# Patient Record
Sex: Male | Born: 1950 | Race: White | Hispanic: No | Marital: Single | Smoking: Never smoker
Health system: Southern US, Community
[De-identification: ages and names within clinical notes are randomized; demographics above are authoritative.]

## PROBLEM LIST (undated history)

## (undated) DIAGNOSIS — N200 Calculus of kidney: Secondary | ICD-10-CM

## (undated) DIAGNOSIS — I4891 Unspecified atrial fibrillation: Secondary | ICD-10-CM

## (undated) DIAGNOSIS — E119 Type 2 diabetes mellitus without complications: Secondary | ICD-10-CM

## (undated) DIAGNOSIS — I10 Essential (primary) hypertension: Secondary | ICD-10-CM

## (undated) DIAGNOSIS — R251 Tremor, unspecified: Secondary | ICD-10-CM

## (undated) DIAGNOSIS — M1990 Unspecified osteoarthritis, unspecified site: Secondary | ICD-10-CM

## (undated) DIAGNOSIS — I219 Acute myocardial infarction, unspecified: Secondary | ICD-10-CM

## (undated) DIAGNOSIS — E7800 Pure hypercholesterolemia, unspecified: Secondary | ICD-10-CM

## (undated) HISTORY — DX: Essential (primary) hypertension: I10

## (undated) HISTORY — DX: Acute myocardial infarction, unspecified: I21.9

## (undated) HISTORY — PX: KIDNEY STONE SURGERY: SHX686

## (undated) HISTORY — DX: Calculus of kidney: N20.0

## (undated) HISTORY — DX: Pure hypercholesterolemia, unspecified: E78.00

## (undated) HISTORY — PX: CHOLECYSTECTOMY: SHX55

## (undated) HISTORY — DX: Tremor, unspecified: R25.1

## (undated) HISTORY — DX: Unspecified osteoarthritis, unspecified site: M19.90

---

## 1998-02-28 ENCOUNTER — Emergency Department (HOSPITAL_COMMUNITY): Admission: EM | Admit: 1998-02-28 | Discharge: 1998-02-28 | Payer: Self-pay | Admitting: Emergency Medicine

## 1998-04-26 ENCOUNTER — Observation Stay (HOSPITAL_COMMUNITY): Admission: RE | Admit: 1998-04-26 | Discharge: 1998-04-27 | Payer: Self-pay

## 2005-01-20 ENCOUNTER — Encounter: Admission: RE | Admit: 2005-01-20 | Discharge: 2005-01-20 | Payer: Self-pay | Admitting: Orthopaedic Surgery

## 2005-01-22 ENCOUNTER — Encounter: Admission: RE | Admit: 2005-01-22 | Discharge: 2005-01-22 | Payer: Self-pay | Admitting: Orthopaedic Surgery

## 2008-03-19 ENCOUNTER — Ambulatory Visit: Payer: Self-pay | Admitting: Cardiovascular Disease

## 2008-03-19 ENCOUNTER — Inpatient Hospital Stay (HOSPITAL_COMMUNITY): Admission: EM | Admit: 2008-03-19 | Discharge: 2008-03-22 | Payer: Self-pay | Admitting: Cardiology

## 2008-03-20 ENCOUNTER — Encounter: Payer: Self-pay | Admitting: Cardiology

## 2008-03-24 ENCOUNTER — Ambulatory Visit: Payer: Self-pay | Admitting: Internal Medicine

## 2008-03-30 ENCOUNTER — Ambulatory Visit: Payer: Self-pay | Admitting: Cardiovascular Disease

## 2008-04-06 ENCOUNTER — Ambulatory Visit: Payer: Self-pay | Admitting: Cardiology

## 2008-04-15 ENCOUNTER — Ambulatory Visit: Payer: Self-pay | Admitting: Internal Medicine

## 2008-04-23 ENCOUNTER — Ambulatory Visit: Payer: Self-pay | Admitting: Cardiology

## 2008-05-14 ENCOUNTER — Ambulatory Visit: Payer: Self-pay | Admitting: Cardiovascular Disease

## 2008-06-11 ENCOUNTER — Ambulatory Visit: Payer: Self-pay | Admitting: Internal Medicine

## 2008-07-14 ENCOUNTER — Ambulatory Visit: Payer: Self-pay | Admitting: Cardiology

## 2008-08-11 ENCOUNTER — Ambulatory Visit: Payer: Self-pay | Admitting: Internal Medicine

## 2008-10-30 DIAGNOSIS — I639 Cerebral infarction, unspecified: Secondary | ICD-10-CM | POA: Insufficient documentation

## 2008-10-30 HISTORY — DX: Cerebral infarction, unspecified: I63.9

## 2009-05-22 IMAGING — CT CT ABDOMEN W/ CM
2 of 5 series · 14 of 32 positions shown, 19 images · IV contrast (agent unspecified)
Comparison: None

CT ABDOMEN

CLINICAL DATA: Severe abdominal pain.  History of nephrolithiasis.

CT ABDOMEN AND PELVIS WITH CONTRAST
TECHNIQUE: Multidetector CT imaging of the abdomen and pelvis was
performed using the standard protocol following bolus
administration of intravenous contrast.
Contrast: 100 ml [id number] and oral contrast

[Series 2: routine abdomen · axial · 0.81mm/px · z∈[-455,-60]mm · 8 of 103 slices shown, 13 images]
[im 12/103  soft-tissue]
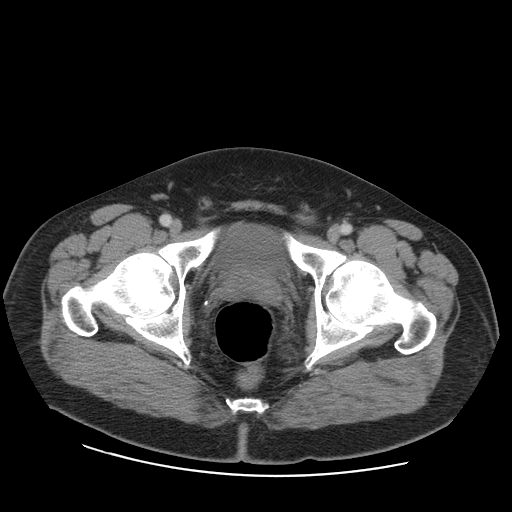
[im 12/103  bone]
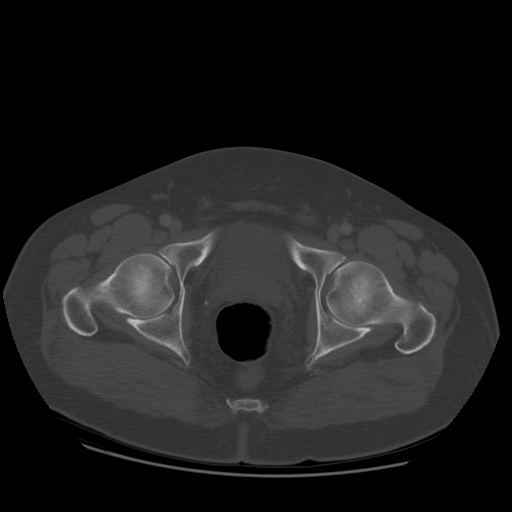
[im 23/103  soft-tissue]
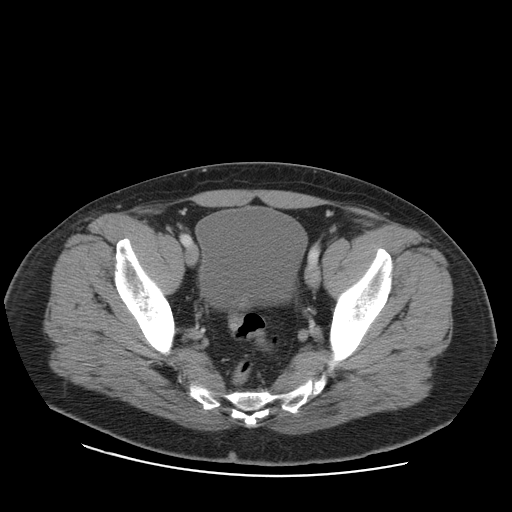
[im 35/103  soft-tissue]
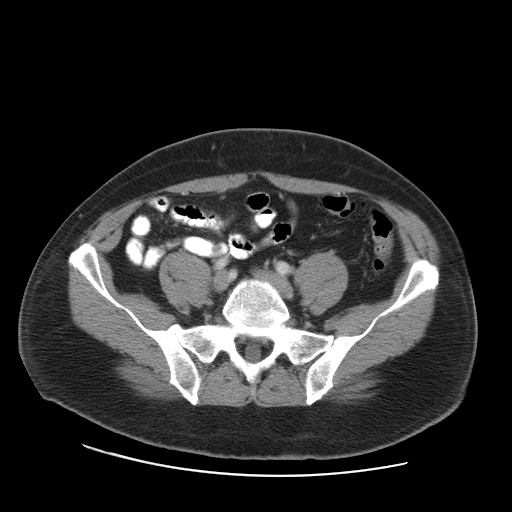
[im 46/103  soft-tissue]
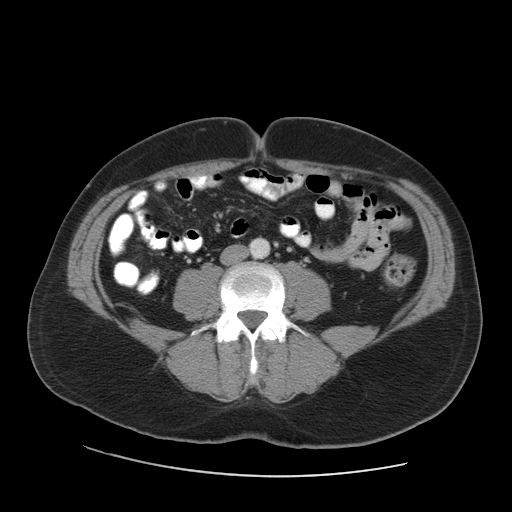
[im 57/103  soft-tissue]
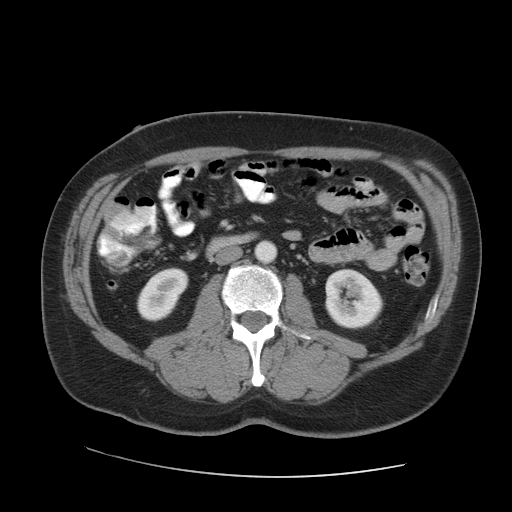
[im 57/103  lung]
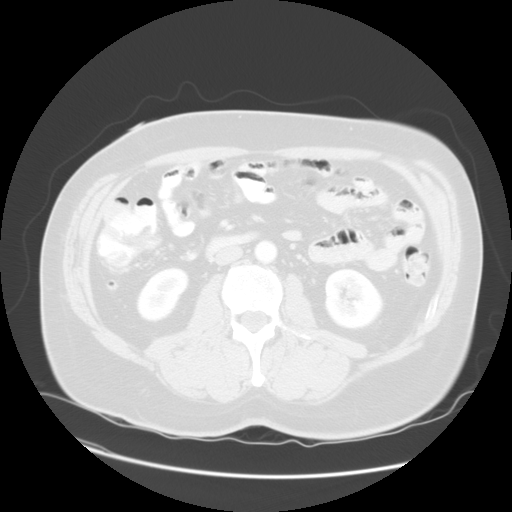
[im 69/103  soft-tissue]
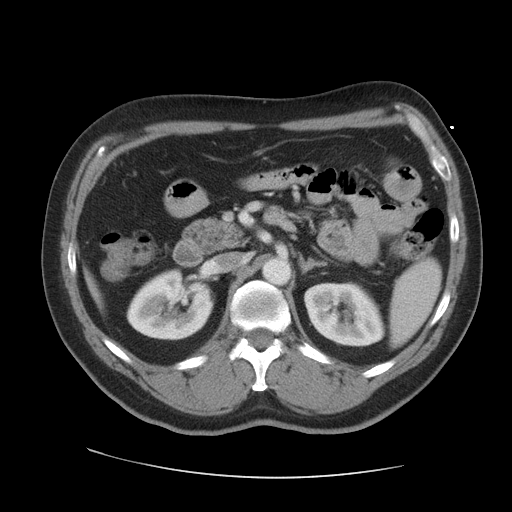
[im 69/103  lung]
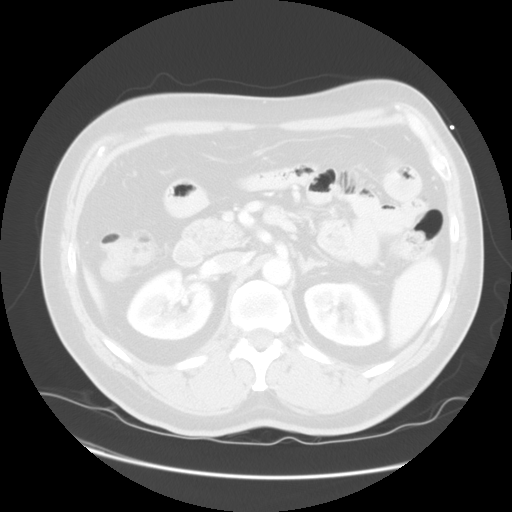
[im 80/103  soft-tissue]
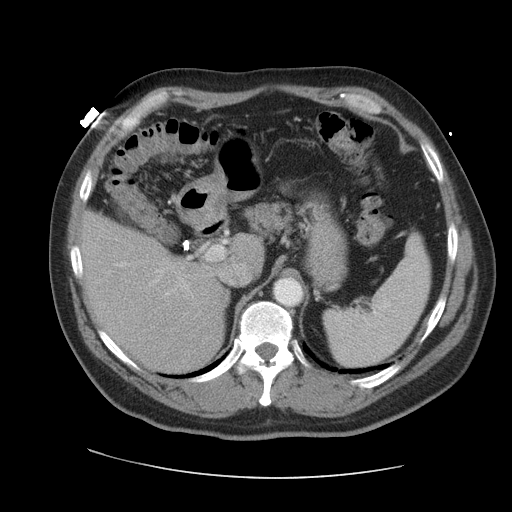
[im 80/103  lung]
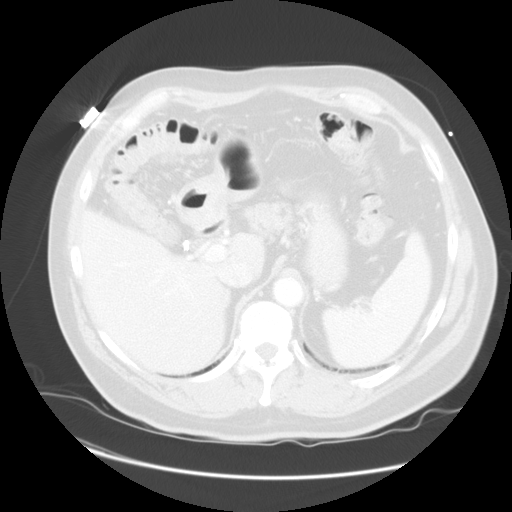
[im 91/103  soft-tissue]
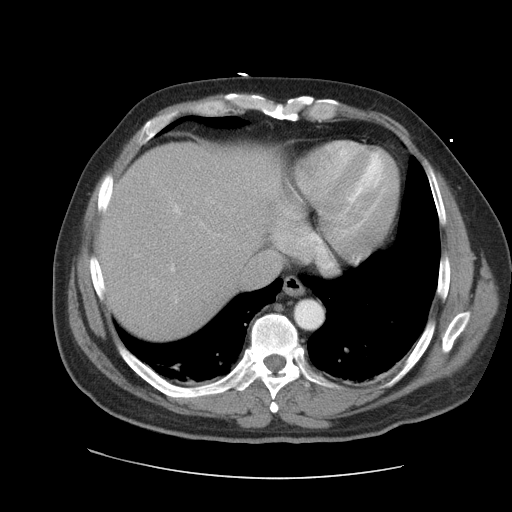
[im 91/103  lung]
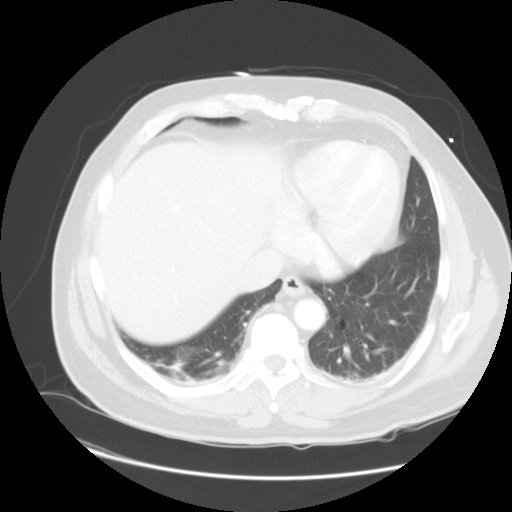

[Series 400: sag a/p · sagittal · 1.05mm/px · 6 of 87 slices shown]
[im 11/87  soft-tissue]
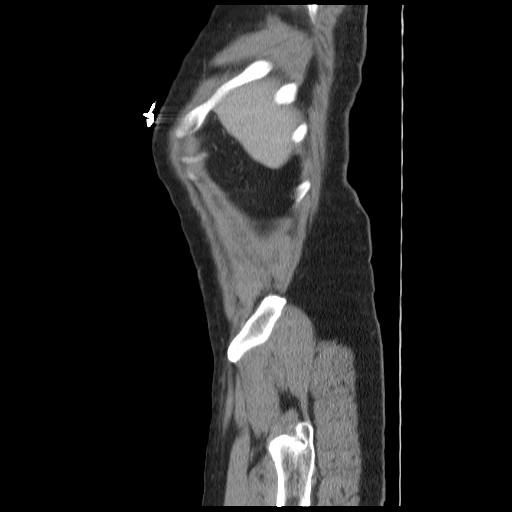
[im 22/87  soft-tissue]
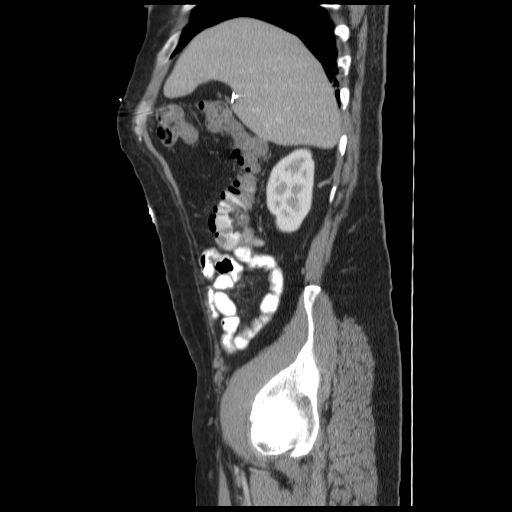
[im 33/87  soft-tissue]
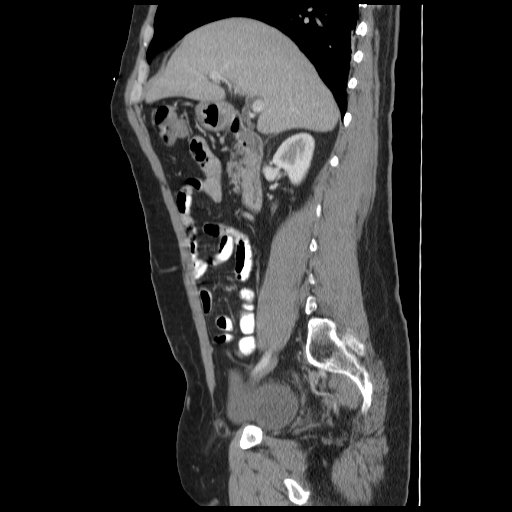
[im 44/87  soft-tissue]
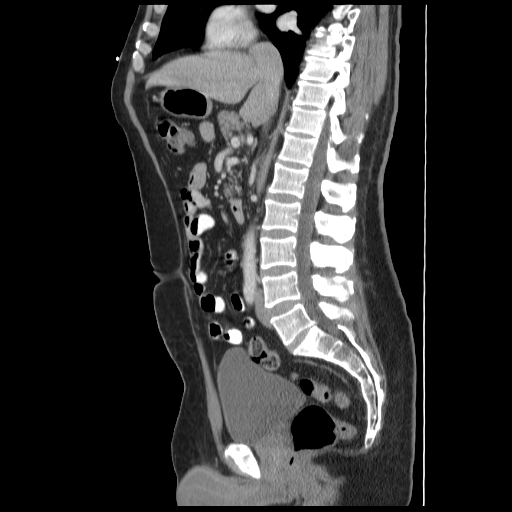
[im 54/87  soft-tissue]
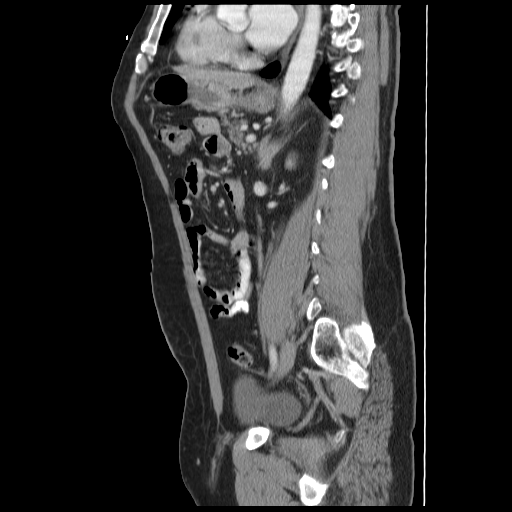
[im 65/87  soft-tissue]
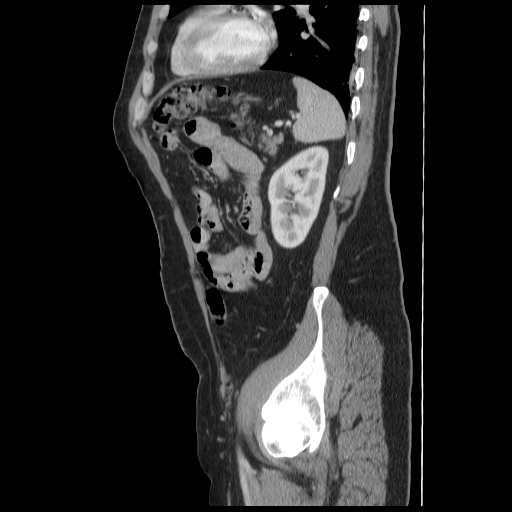

[14 of 32 positions shown; findings below may reference images not displayed]

FINDINGS: Mild bibasilar atelectasis or scarring is noted.
Surgical clips are seen from prior cholecystectomy.  Several tiny
left hepatic lobe cysts are seen but no liver masses are
identified.  The spleen, pancreas, adrenal glands, and kidneys are
normal appearance.  There is no evidence of hydronephrosis.

No abdominal soft tissue masses or areas of lymphadenopathy are
identified.  There is no evidence of inflammatory process or
abnormal fluid collections.  Abdominal bowel loops are unremarkable
appearance.
IMPRESSION: 1.  No acute abdominal process.
2.  Mild bibasilar atelectasis versus scarring.

CT PELVIS
FINDINGS: There is no evidence of pelvic mass or lymphadenopathy.
There is no evidence of inflammatory process or abnormal fluid
collections.  Normal appendix is visualized.  Mild diverticulosis
is seen involve the sigmoid colon however there is no evidence of
diverticulitis.
IMPRESSION: 1.  No acute findings.
2.  Mild sigmoid diverticulosis.  No evidence of diverticulitis.

## 2009-06-09 ENCOUNTER — Encounter: Payer: Self-pay | Admitting: Cardiology

## 2009-06-15 ENCOUNTER — Encounter (INDEPENDENT_AMBULATORY_CARE_PROVIDER_SITE_OTHER): Payer: Self-pay | Admitting: *Deleted

## 2009-11-30 DIAGNOSIS — Z1211 Encounter for screening for malignant neoplasm of colon: Secondary | ICD-10-CM | POA: Insufficient documentation

## 2009-11-30 HISTORY — DX: Encounter for screening for malignant neoplasm of colon: Z12.11

## 2011-03-14 NOTE — Assessment & Plan Note (Signed)
[facility]                            CARDIOLOGY OFFICE NOTE   NAME:Cory Brown, Cory Brown                        MRN:          [id number]  DATE:04/23/2008                            DOB:          1951/02/18    PRIMARY:  [facility].   REASON FOR PRESENTATION:  Evaluate the patient with atrial fibrillation.   HISTORY OF PRESENT ILLNESS:  This patient was admitted on June 21 with  atrial fibrillation and rapid ventricular rate.  He developed shortness  of breath and sweating and abdominal lower chest discomfort.  He  subsequently converted to sinus rhythm spontaneously.  However, because  of risk factors with diabetes and hypertension, Dr. Ladona Ridgel suggested  that he remain on Coumadin.  He had an echocardiogram prior to  discharge, which demonstrated an EF of 60-65%.  There were no  significant valvular abnormalities.   Prior to discharge, the patient was seen by GI because of abdominal  discomfort.  He was treated with Robinul Forte and Protonix.  The  etiology was not entirely clear.  The patient was to continue this for 2  months.  However, he developed a rash.  He is treated with Benadryl and  he had his Protonix and Robinul Forte discontinued and the rash has  resolved.   Since discharge, he has felt well.  He has had no palpitation or  presyncope or syncope.  He has had no chest discomfort.  He has had none  of the abdominal discomfort that prompted his hospitalization.   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   ALLERGIES INTOLERANCES:  He may have had a rash to PROTONIX.   CURRENT MEDICATIONS:  Cardizem 120 mg daily, aspirin 325 mg daily,  metformin 5 mg b.i.d., Lipitor, Coumadin, prednisone.   REVIEW OF SYSTEMS:  As stated in the HPI, otherwise negative for other  systems.   PHYSICAL  EXAMINATION:  GENERAL:  The patient is in no distress.  VITAL SIGNS:  Blood pressure 139/80, heart 86 and regular, and weight  221 pounds.  NECK:  No jugular venous distention at 45 degrees.  Carotid upstroke  brisk and symmetrical.  No bruits, no thyromegaly.  LYMPHATICS:  No  adenopathy.  LUNGS:  Clear to auscultation bilaterally.  BACK:  No costovertebral angle tenderness.  CHEST:  Unremarkable.  HEART:  PMI is not displaced or sustained, S1 and S2 within normal  limits, no S3, no S4, no clicks, no rubs, and no murmurs.  ABDOMEN:  Flat, positive bowel sounds.  Normal in frequency and pitch, no bruits,  no rebound, no guarding or midline pulsatile mass.  No hepatomegaly or  splenomegaly.  SKIN:  No rashes.  EXTREMITIES:  Pulses are 2+ throughout, no edema, no cyanosis, and no  clubbing.  NEURO:  Oriented to person, place, and time, cranial nerves II-XII are  grossly intact, motor grossly intact throughout.   DIAGNOSTIC IMAGING:  EKG; sinus rhythm, rate 82, axis within normal  limits, intervals within normal limits, RSR  prime V1-V2, no acute ST-  wave changes.   ASSESSMENT/PLAN:  1. Atrial fibrillation.  The patient had atrial fibrillation with a      [place] score of 2.  He has been placed on anticoagulation because of      this.  He is now in sinus rhythm.  He will remain on the Cardizem,      which can also treat his hypertension.  Of note, his      anticoagulation could be held at any time should he need to have      any invasive procedures and resumed, as felt safe thereafter.  2. Abdominal discomfort.  The patient had abdominal discomfort that      seems to have resolved.  He should have followup with the      gastrointestinal doctor, if this recurs.  Also, it was suggested      that he needs a screening colonoscopy, which could be done in      [place], where he lives.  3. Hypertension.  Blood pressure is controlled on the medications as      listed.  4. Diabetes.  Per his  primary care doctor.  5. Followup.  We will see the patient back in 1 year or sooner if      needed.     Rollene Rotunda, MD, [facility]  Electronically Signed    JH/MedQ  DD: 04/23/2008  DT: 04/24/2008  Job #: [id number]   cc:   [facility]

## 2011-03-14 NOTE — Discharge Summary (Signed)
Cory Brown, Cory Brown                 ACCOUNT NO.:  [id number]   MEDICAL RECORD NO.:  [id number]          PATIENT TYPE:  INP   LOCATION:  2910                         FACILITY:  [facility]   PHYSICIAN:  Doylene Canning. Ladona Ridgel, MD    DATE OF BIRTH:  10-Jan-1951   DATE OF ADMISSION:  03/19/2008  DATE OF DISCHARGE:  03/21/2008                         DISCHARGE SUMMARY - REFERRING   PRIMARY CARE PHYSICIAN:  Dr. Shelby Dubin of [facility].   CARDIOLOGIST:  Rollene Rotunda, MD, [facility].   DISCHARGE DIAGNOSES:  1. Atrial fibrillation with a rapid ventricular response and      spontaneous conversion to normal sinus rhythm in less than 48      hours.  2. Epigastric discomfort of uncertain etiology.  3. Hyperglycemia.  4. Elevated LFTs of uncertain etiology.  5. History as noted below.  6. Hypertension.   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  At [facility] EKG showed  atrial fibrillation with a rapid rate.  He was minimally aware of his  racing heart beat.  He was transferred for further evaluation.  He was  placed on IV diltiazem with rate control and spontaneous conversion to  normal sinus rhythm.  It is noted that the patient has had mild chest  discomfort in the past but it usually resolves with a few minutes or  rest.   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   LABORATORY DATA:  On the 23rd at the time of discharge H and H was 16.7  and 47.4, normal indices, platelets 177.  WBC 7.8, PT 14.1.  On  admission sodium was 139, potassium 3.7, BUN 13, creatinine 0.83,  glucose 142.  AST and ALT were slightly elevated at 56 and 78.  Protein  and albumin were low at 59  and 3.4.  Hemoglobin A1c was elevated at 6.3,  amylase and lipase within normal limits at 39 and 27.  CK MBs relative  indexes and troponins were within normal limits x5.  BNP on admission  was 81.  Fasting lipids on May 23 showed a total cholesterol of 86,  triglycerides 60, HDL was low at 33 and LDL was 41.  TSH was within  normal limits at 0.95.  CT of the abdomen and pelvis which showed mild  bibasilar atelectasis or scarring, surgical clips from cholecystectomy.  No evidence of hydronephrosis, no acute abdominal processes.  There is  mild sigmoid diverticulosis.  No evidence of diverticulitis.  EKG at  [facility] showed atrial fibrillation with a ventricular rate  of 134 and improved to 79 on May 22.  The patient was in sinus  bradycardia with nonspecific ST-T wave changes.   HOSPITAL COURSE:  Cory Brown was admitted to [facility] by Dr.  Lalla Brothers.  He was placed on Lovenox.  Progression nurse assisted with  discharge needs.  Echocardiogram was performed on May 22 which revealed  an ejection fraction of 60-65% without abnormalities.  IV Cardizem were  changed to p.o.  His CHADS score was a 1 thus aspirin was continued and  it was felt that he did not need long term Coumadin.  Scans of his  abdomen and pelvis were performed as previously without acute  abnormalities.  His AST and ALT were slightly elevated with a normal  amylase and lipase and a BNP.  On review on May 23 Dr. Ladona Ridgel felt that  the patient could be discharged home.   DISPOSITION:  The patient is discharged home.  He received a new  prescription for Cardizem CD 120 mg daily.  He was asked to increase his  aspirin to 325 mg daily.  To continue metformin 500 mg b.i.d. and his  Lipitor unknown dosage daily at bedtime.  He was asked to begin a blood  pressure diary and bring all medications and his blood pressure diary to  all appointments.  He was asked to maintain a low sodium heart-healthy  ADA diet.   Wound care and activity are not restricted or applicable.  Our office will call him with a followup appointment with Dr. Antoine Poche.  He was asked to follow up with Dr. Shelby Dubin as scheduled.  Continue  followup of his slightly elevated LFTs should be continued given he is  on Lipitor.  Discharge time 45 minutes.      Joellyn Rued, PA-C      Doylene Canning. Ladona Ridgel, MD  Electronically Signed    EW/MEDQ  D:  03/21/2008  T:  03/21/2008  Job:  [id number]   cc:   Rollene Rotunda, MD, [facility], Dr

## 2011-03-14 NOTE — Consult Note (Signed)
Cory Brown, Cory Brown                 ACCOUNT NO.:  [id number]   MEDICAL RECORD NO.:  [id number]           PATIENT TYPE:   LOCATION:                                 FACILITY:   PHYSICIAN:  Iva Boop, MD,FACGDATE OF BIRTH:  04-02-1951   DATE OF CONSULTATION:  03/21/2008  DATE OF DISCHARGE:                                 CONSULTATION   REASON FOR CONSULTATION:  Abdominal pain.   ASSESSMENT:  This is a 60 year old white man who was admitted with a  sudden episode of chest pain and epigastric pain described as tightness,  he was in atrial fibrillation with rapid ventricular response at  [facility] and was transferred to [facility] where he was  admitted.  He has been ruled out for MI and started on Coumadin and  Lovenox for the AFib.   He is complaining of a persistent soreness and pressure and even knife-  like epigastric left upper quadrant pain.  A CT scan of the abdomen and  pelvis and laboratory testing to include CBC, amylase, lipase, and LFTs  are all normal.  He does get some relief with Mylanta.  I should note he  has mild diverticulosis but no diverticulitis.  No nephrolithiasis was  seen.  There was a history of that in the past.  A CT scan was performed  with contrast.   I wonder if he is not having some sort of functional or spasm-like pain  versus an ulcer.  I suppose nephrolithiasis is still in the  differential, but this is atypical for that.   PLAN:  1. We will go ahead and check urinalysis.  2. Trial of glycopyrrolate 2 mg b.i.d. and PPI therapy.  3. If he is not significantly better or does not seem to be improving      in the morning, we will consider upper GI endoscopy tomorrow.      Risks, benefits, and indications were explained to the patient.      This plan was discussed with Cory Rued, PA-C.   Note:  He has a remote history of EGD and colonoscopy.  At some point a  screening colonoscopy would be appropriate for this man.   HISTORY:  A  60 year old white man with a story as above.  There is no  vomiting, though he has been nauseous.  No descriptions of dysphagia,  melena, GI bleeding, or change in bowel habits.  He says his abdomen is  always somewhat sore.  He was told by Dr. Terrial Rhodes to take  Prilosec years ago after his gallbladder was removed.   MEDICATIONS:  On admission were metformin, aspirin and antilipid agent.   HOSPITAL MEDICATIONS:  1. Coumadin.  2. Lovenox.  3. Aspirin 325 mg daily.  4. Cardizem 30 mg every 6 hours.  5. Sliding-scale insulin.  6. Crestor 20 mg each evening.   PAST MEDICAL HISTORY:  Diabetes mellitus type 2, dyslipidemia,  nephrolithiasis, prior cholecystectomy, and prior surgical removal of  benign mass in the upper palate.  The patient has degenerative disk  disease in the cervical spine  based upon previous MRI.   SOCIAL HISTORY:  He lives in [place].  He is single.  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  No tobacco.  No alcohol use.  His girlfriend is here  with him.   FAMILY HISTORY:  Mother had coronary artery disease.  Father had  coronary artery disease.   REVIEW OF SYSTEMS:  Positive for back pain, joint pain, headaches,  snoring, and urinary frequency.  All other systems appear negative.   PHYSICAL EXAMINATION:  GENERAL: He is in no acute distress.  VITAL SIGNS: Pulse 63 and regular, normal sinus rhythm, and blood  pressure 136/79.  He is afebrile.  Respirations are 18.  HEENT:  Eyes, pupils are round and reactive to light.  No arcus.  Anicteric.  Mouth, missing multiple teeth with free of lesions.  Posterior pharynx is clear.  NECK: Supple.  No thyromegaly or mass.  CHEST:  Clear.  HEART:  S1 and S2.  I hear no rubs or gallops.  He is in sinus rhythm.  No jugular venous distention.  ABDOMEN: Soft.  He has mild soreness or tenderness in the epigastrium  and left upper quadrant and some around the flanks or ribs and the chest  wall are nontender.  There is no obvious  CVA tenderness, though there is  a hint of that on the left.  No splenomegaly or mass.  EXTREMITIES: Lower extremities free edema.  Upper extremities free of  cyanosis or clubbing.  NEUROLOGIC: Cranial nerves II-XII grossly intact.  He is oriented x3  PSYCH: Appropriate mood and affect.   LABORATORY DATA:  Cardiac enzymes have been negative.  Lipid profile  normal except HDL 33.  Protime INR 1.1.  CBC normal.  Hemoglobin A1c  6.3.  Lipase normal.  Amylase normal.  TSH normal.  Brain natriuretic  peptide 81.  Magnesium level 2.1.  AST and ALT are mildly elevated at 56  and 78, otherwise normal CMET.   Note:  His symptoms do not correlate with biliary disease either on the  wrong side, mild elevation.  His transaminases is nonspecific at this  time and of unclear etiology, and I doubt that it is related to his  pain. I do not think this is a liver problem.    I appreciate the opportunity to care for this patient.      Iva Boop, MD,FACG  Electronically Signed     CEG/MEDQ  D:  03/21/2008  T:  03/21/2008  Job:  [id number]

## 2011-03-14 NOTE — Discharge Summary (Signed)
[name], Cory Brown                 ACCOUNT NO.:  [id number]   MEDICAL RECORD NO.:  [id number]          PATIENT TYPE:  INP   LOCATION:  2910                         FACILITY:  [facility]   PHYSICIAN:  Rollene Rotunda, MD, FACCDATE OF BIRTH:  08-22-1951   DATE OF ADMISSION:  03/19/2008  DATE OF DISCHARGE:                         DISCHARGE SUMMARY - REFERRING   ADDENDUM:  [name] discharge was anticipated on Mar 21, 2008;  however, prior to discharge planning he developed more epigastric and  abdominal discomfort which was initially relieved with Maalox.  Due to  reoccurring symptoms, Dr. Ladona Ridgel asked Dr. Leone Payor to see the patient.  Dr. Leone Payor began him on Robinul Forte and Protonix.  By the 26th, Dr.  Leone Payor felt that the patient felt much better and felt that he could be  discharged home and follow up with him if needed.  He had advised the  patient to take the proton pump inhibitor for 2 months and then  discontinue but to resume it if his symptoms persisted.  He recommended  that the patient get a screening colonoscopy at some point but  recommended this be done off Coumadin.  This could be done in [place]  or with [place] in [place].  Dr. Ladona Ridgel, on further review, given his  atrial fibrillation and risk factors of hypertension and diabetes and a  Italy score of 2, he felt that he should be on Coumadin.  Thus, Coumadin  5 mg daily was also prescribed prior to discharge.  Pharmacy assisted  with teaching and gave him a 10 mg dose on the 24th.  The office will  call him with arrangements for a PT/INR on Wednesday in addition to the  previous discharge instructions.   Discharge time in combination with yesterday is for a total of probably  55 minutes.      Joellyn Rued, PA-C      Rollene Rotunda, MD, [facility]  Electronically Signed    EW/MEDQ  D:  03/22/2008  T:  03/22/2008  Job:  [id number]   cc:   Iva Boop, MD,FACG  Rollene Rotunda, MD, [facility]  Delano Metz, M.D.

## 2011-03-14 NOTE — H&P (Signed)
Cory Brown, Cory Brown NO.:  [id number]   MEDICAL RECORD NO.:  [id number]           PATIENT TYPE:   LOCATION:                                 FACILITY:   PHYSICIAN:  Christell Faith, MD   DATE OF BIRTH:  1951/10/20   DATE OF ADMISSION:  03/20/2008  DATE OF DISCHARGE:                              HISTORY & PHYSICAL   PRIMARY CARE PHYSICIAN:  Dr. Montel Culver at [facility].   Admitted by Dr. Rollene Rotunda at [facility].   CHIEF COMPLAINT:  Chest tightness.   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  The discomfort was described as tightness.  It persisted for almost an  hour, so he went to the local emergency room, which was [facility],  where an electrocardiogram demonstrated atrial fibrillation with rapid  ventricular response.  The patient had minimal awareness of racing  heart.  He denies radiation of the pain.  He was made pain free at the  outside hospital and transferred to [facility], where he remains pain  free with a heart rate of approximately 100 beats a minute still in  atrial fibrillation.  He has had a mild chest tightness in the past, but  usually resolved with rest after a few minutes and nothing has lasted  this long.   PAST MEDICAL HISTORY:  1. Diabetes.  2. Hyperlipidemia.  3. Nephrolithiasis.  4. Status post cholecystectomy.  5. Status post surgical removal of the benign mass from his upper      palate several years ago.   SOCIAL HISTORY:  He lives in [place].  He is single.  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  No tobacco, no alcohol.   FAMILY HISTORY:  Mother died of coronary disease in her 68s.  Father  died of coronary disease at age 37.   REVIEW OF SYSTEMS:  Otherwise negative.   ALLERGIES:  None.   MEDICINES:  1. Lipid, he is not  sure of the dose.  2. Metformin 500 mg b.i.d.  3. Aspirin 81 mg daily.   PHYSICAL EXAMINATION:  VITAL SIGNS:  Temperature 98.2, pulse ranging  from 85-110, respiratory rate 18, blood pressure 150/90, saturation 97%  on room air.  GENERAL:  This is a very pleasant white man in no distress.  HEENT:  Pupils equal, round and reactive to light.  Sclerae are clear.  He is missing his teeth on the top which is postsurgical and is not  wearing a denture.  Mucous membranes are moist.  No oral lesions.  NECK:  Supple.  Neck veins are flat.  No carotid bruits.  LUNGS:  Clear to auscultation bilaterally without wheezing or rales.  CARDIAC:  Tachycardiac rate, irregular rhythm.  No murmurs or gallops.  ABDOMEN: Soft, nontender, nondistended.  No hepatomegaly.  EXTREMITIES:  Reveal no edema, no cyanosis.  2+ dorsalis pedis and  radial pulses bilaterally.  SKIN:  Reveals no rash or lesion.  MUSCULOSKELETAL:  Reveals no acute joint effusions or tenderness.  NEUROLOGIC:  The patient is awake, alert, oriented x3 with no overt  neurologic deficits.   DIAGNOSTIC TESTS:  Chest X-Ray shows no acute process.  Electrocardiogram from outside hospital shows atrial fibrillation with a  rate of 134 beats per minute with no ST or T-wave changes.   LABORATORY DATA:  White blood cell 5.8, hemoglobin 16.9, platelets 207.  Sodium 140, potassium 4.4, BUN 16, creatinine 0.8, glucose 126, AST 127,  ALT 84, total bilirubin 0.5, D-dimer less than 100.  CK 108, troponin  0.01, INR 0.9.   IMPRESSION:  This is a 60 year old white man with new onset atrial  fibrillation with rapid ventricular response also with probable angina.   PLAN:  1. The patient was transferred to the [facility] CCU.  We will      continue rule out myocardial infarction by cycling serial EKGs and      cardiac enzymes.  2. We will place him on a diltiazem drip for heart rate control, goal      less than 90 beats per minute.  3. Continue  anticoagulation with Lovenox and initiate Coumadin.  He      will probably need a transesophageal echocardiography prior to      cardioversion as we are not exactly sure of the onset of his atrial      fibrillation.  He will be kept n.p.o. after midnight.  4. We will check a transthoracic echo and TSH as well.  5. Check a fasting lipid panel.  Continue his statin.  6. Diabetes, check hemoglobin A1c, cover with insulin, we will hold      his metformin while in inpatient.  7. He is noted to be hypertensive and we will initiate      hydrochlorothiazide and consider also addition of a beta-blocker or      ACE-I.  8. Assuming he rules out for an myocardial infarction he will probably      need a stress test versus catheterization.  9. He is noted to have elevated AST and ALT, this may represent fatty      liver secondary to his diabetes.  We will repeat these labs in the      morning and consider a liver ultrasound.      Christell Faith, MD  Electronically Signed     NDL/MEDQ  D:  03/20/2008  T:  03/20/2008  Job:  [id number]

## 2011-07-26 LAB — COMPREHENSIVE METABOLIC PANEL
BUN: 13
CO2: 29
Chloride: 107
GFR calc non Af Amer: >60
Glucose, Bld: 142 — ABNORMAL HIGH
Potassium: 3.7
Sodium: 139

## 2011-07-26 LAB — CARDIAC PANEL(CRET KIN+CKTOT+MB+TROPI)
CK, MB: 1.2
CK, MB: 3.2
Relative Index: INVALID
Total CK: 39
Total CK: 78
Troponin I: 0.03
Troponin I: 0.03
Troponin I: 0.03

## 2011-07-26 LAB — LIPID PANEL
Cholesterol: 86
HDL: 29 — ABNORMAL LOW
LDL Cholesterol: 33
Total CHOL/HDL Ratio: 2.6
Triglycerides: 92
VLDL: 12
VLDL: 18

## 2011-07-26 LAB — CBC
Hemoglobin: 16.7
MCHC: 35.3
MCV: 95.7
MCV: 95.7
Platelets: 177
RBC: 4.97
RDW: 12.6
WBC: 5

## 2011-07-26 LAB — PROTIME-INR
INR: 1.1
Prothrombin Time: 13.4
Prothrombin Time: 14.1

## 2011-07-26 LAB — HEPATIC FUNCTION PANEL
ALT: 216 — ABNORMAL HIGH
Alkaline Phosphatase: 118 — ABNORMAL HIGH
Bilirubin, Direct: 0.2
Indirect Bilirubin: 0.7
Total Bilirubin: 0.9
Total Protein: 6

## 2011-07-26 LAB — URINALYSIS, ROUTINE W REFLEX MICROSCOPIC
Nitrite: NEGATIVE
Specific Gravity, Urine: 1.02
Urobilinogen, UA: 1

## 2011-07-26 LAB — HEMOGLOBIN A1C
Hgb A1c MFr Bld: 6.3 — ABNORMAL HIGH
Mean Plasma Glucose: 147

## 2011-07-26 LAB — B-NATRIURETIC PEPTIDE (CONVERTED LAB): Pro B Natriuretic peptide (BNP): 81

## 2011-07-26 LAB — TSH: TSH: 0.925

## 2014-01-07 ENCOUNTER — Encounter (HOSPITAL_COMMUNITY): Payer: Self-pay | Admitting: Emergency Medicine

## 2014-01-07 ENCOUNTER — Emergency Department (HOSPITAL_COMMUNITY)
Admission: EM | Admit: 2014-01-07 | Discharge: 2014-01-08 | Discharge status: Against Medical Advice | Payer: BC Managed Care – PPO | Attending: Emergency Medicine | Admitting: Emergency Medicine

## 2014-01-07 DIAGNOSIS — R109 Unspecified abdominal pain: Secondary | ICD-10-CM | POA: Insufficient documentation

## 2014-01-07 HISTORY — DX: Acute myocardial infarction, unspecified: I21.9

## 2014-01-07 HISTORY — DX: Unspecified atrial fibrillation: I48.91

## 2014-01-07 HISTORY — DX: Type 2 diabetes mellitus without complications: E11.9

## 2014-01-07 LAB — COMPREHENSIVE METABOLIC PANEL
ALT: 37 U/L (ref 0–53)
AST: 40 U/L — ABNORMAL HIGH (ref 0–37)
Albumin: 4.5 g/dL (ref 3.5–5.2)
Alkaline Phosphatase: 113 U/L (ref 39–117)
BUN: 17 mg/dL (ref 6–23)
CO2: 25 meq/L (ref 19–32)
Calcium: 9.9 mg/dL (ref 8.4–10.5)
Chloride: 99 mEq/L (ref 96–112)
Creatinine, Ser: 0.8 mg/dL (ref 0.50–1.35)
GLUCOSE: 170 mg/dL — AB (ref 70–99)
Potassium: 4.1 mEq/L (ref 3.7–5.3)
Sodium: 139 mEq/L (ref 137–147)
Total Bilirubin: 0.5 mg/dL (ref 0.3–1.2)
Total Protein: 8 g/dL (ref 6.0–8.3)

## 2014-01-07 LAB — I-STAT TROPONIN, ED: Troponin i, poc: 0 ng/mL (ref 0.00–0.08)

## 2014-01-07 LAB — CBC WITH DIFFERENTIAL/PLATELET
Basophils Absolute: 0 10*3/uL (ref 0.0–0.1)
Basophils Relative: 1 % (ref 0–1)
EOS ABS: 0.1 10*3/uL (ref 0.0–0.7)
Eosinophils Relative: 2 % (ref 0–5)
HCT: 47.7 % (ref 39.0–52.0)
Hemoglobin: 17.5 g/dL — ABNORMAL HIGH (ref 13.0–17.0)
LYMPHS ABS: 2.6 10*3/uL (ref 0.7–4.0)
Lymphocytes Relative: 35 % (ref 12–46)
MCH: 33.7 pg (ref 26.0–34.0)
MCHC: 36.7 g/dL — ABNORMAL HIGH (ref 30.0–36.0)
MCV: 91.7 fL (ref 78.0–100.0)
MONOS PCT: 12 % (ref 3–12)
Monocytes Absolute: 0.9 10*3/uL (ref 0.1–1.0)
Neutro Abs: 3.7 10*3/uL (ref 1.7–7.7)
Neutrophils Relative %: 51 % (ref 43–77)
Platelets: 204 10*3/uL (ref 150–400)
RBC: 5.2 MIL/uL (ref 4.22–5.81)
RDW: 12.5 % (ref 11.5–15.5)
WBC: 7.4 10*3/uL (ref 4.0–10.5)

## 2014-01-07 LAB — URINALYSIS, ROUTINE W REFLEX MICROSCOPIC
BILIRUBIN URINE: NEGATIVE
GLUCOSE, UA: 250 mg/dL — AB
Hgb urine dipstick: NEGATIVE
KETONES UR: NEGATIVE mg/dL
LEUKOCYTES UA: NEGATIVE
NITRITE: NEGATIVE
PH: 5 (ref 5.0–8.0)
PROTEIN: NEGATIVE mg/dL
Specific Gravity, Urine: 1.025 (ref 1.005–1.030)
Urobilinogen, UA: 0.2 mg/dL (ref 0.0–1.0)

## 2014-01-07 LAB — LIPASE, BLOOD: LIPASE: 191 U/L — AB (ref 11–59)

## 2014-01-07 NOTE — ED Notes (Signed)
Pt name called three time and no response.

## 2014-01-07 NOTE — ED Notes (Addendum)
Pt had an MI 5 wks ago, c/o gen abd pain x 20 minutes.  Pt's wife is a pt upstairs and he started feeling bad.  Pt states that he had this feeling a few years ago and was told that his pain was d/t his heart "because the bottom part of your heart is what controls your stomach".

## 2014-04-24 ENCOUNTER — Ambulatory Visit (HOSPITAL_COMMUNITY)
Admission: AD | Admit: 2014-04-24 | Discharge: 2014-04-24 | Disposition: A | Discharge status: Home / Self Care | Payer: BC Managed Care – PPO | Source: Other Acute Inpatient Hospital | Attending: Cardiology | Admitting: Cardiology

## 2014-04-24 DIAGNOSIS — I219 Acute myocardial infarction, unspecified: Secondary | ICD-10-CM | POA: Insufficient documentation

## 2020-12-27 ENCOUNTER — Other Ambulatory Visit: Payer: Self-pay | Admitting: *Deleted

## 2020-12-27 ENCOUNTER — Encounter: Payer: Self-pay | Admitting: *Deleted

## 2020-12-29 ENCOUNTER — Encounter: Payer: Self-pay | Admitting: Diagnostic Neuroimaging

## 2020-12-29 ENCOUNTER — Ambulatory Visit (INDEPENDENT_AMBULATORY_CARE_PROVIDER_SITE_OTHER): Payer: Medicare PPO | Admitting: Diagnostic Neuroimaging

## 2020-12-29 VITALS — BP 174/94 | HR 76 | Ht 73.0 in | Wt 229.0 lb

## 2020-12-29 DIAGNOSIS — R29898 Other symptoms and signs involving the musculoskeletal system: Secondary | ICD-10-CM

## 2020-12-29 DIAGNOSIS — G250 Essential tremor: Secondary | ICD-10-CM

## 2020-12-29 MED ORDER — PRIMIDONE 50 MG PO TABS: 50.0000 mg | ORAL_TABLET | Freq: Two times a day (BID) | ORAL | 5 refills | Status: DC

## 2020-12-29 NOTE — Patient Instructions (Signed)
  RIGHT ARM / RIGHT LEG WEAKNESS - check MRI brain   ESSENTIAL TREMOR - start primidone 25mg  at bedtime; then increase to 25mg  twice a day; then increase to 50mg  twice a day   DIABETIC NERVE PAIN - continue gabapentin 300mg  at bedtime

## 2020-12-29 NOTE — Progress Notes (Signed)
[facility]  PATIENT: Cory Brown DOB: 12-17-1950  REFERRING CLINICIAN: Jeanmarie Hubert, DO HISTORY FROM: patient  REASON FOR VISIT: new consult    HISTORICAL  CHIEF COMPLAINT:  Chief Complaint  Patient presents with  . Cerebrovascular Accident    Rm 6 New Pt "stroke in 2010, tremors so bad I can't write"   . Tremors    HISTORY OF PRESENT ILLNESS:   70 year old male here for evaluation of right-sided shaking.    Symptoms started around 2017 with gradual onset right hand tremor and shaking.  He is also noticed some incoordination on the right side.  Symptoms are progressively worsened over time.  He is mainly postural action tremor of his right hand.  Having trouble holding cup of coffee and doing fine movements with screwdriver.  He saw neurologist at the [facility] hospital who recommended him to continue on gabapentin.  Gabapentin initially started for diabetic nerve pain.  Family history of tremor in his brother and Parkinson's disease in maternal grandmother.  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  He served in [employer] previously.  He enjoys restoring old cars.   REVIEW OF SYSTEMS: Full 14 system review of systems performed and negative with exception of: As per HPI.  ALLERGIES: No Known Allergies  HOME MEDICATIONS: Outpatient Medications Prior to Visit  Medication Sig Dispense Refill  . amLODipine (NORVASC) 5 MG tablet Take 1 tablet by mouth daily.    Marland Kitchen aspirin 81 MG EC tablet Take 1 tablet by mouth daily.    . dabigatran (PRADAXA) 150 MG CAPS capsule TAKE ONE CAPSULE BY MOUTH TWICE A DAY (CAUTION: ANTICOAGULANT)    . glimepiride (AMARYL) 4 MG tablet TAKE ONE TABLET BY MOUTH ONCE A DAY FOR DIABETES    . guaifenesin (HUMIBID E) 400 MG TABS tablet Take 1 tablet by mouth 2 (two) times daily.    . insulin glargine (LANTUS) 100 UNIT/ML injection INJECT 14 UNITS SUBCUTANEOUSLY AT BEDTIME FOR  DIABETES (USE WITHIN 28 DAYS AFTER OPENING VIAL) MAKE SURE TO ROTATE INJECTION SITE    . loratadine (CLARITIN) 10 MG tablet Take 1 tablet by mouth daily.    Marland Kitchen losartan (COZAAR) 100 MG tablet TAKE ONE TABLET BY MOUTH ONCE A DAY FOR BLOOD PRESSURE AND HEART. (REPLACES LISINOPRL 40MG )    . metFORMIN (GLUCOPHAGE) 500 MG tablet Take by mouth 2 (two) times daily with a meal.    . metoprolol tartrate (LOPRESSOR) 100 MG tablet TAKE ONE TABLET BY MOUTH TWICE A DAY FOR HEART, BLOOD PRESSURE, HEART RATE. (REPLACES PROPRANOLOL)    . rosuvastatin (CRESTOR) 10 MG tablet Take 10 mg by mouth daily.     No facility-administered medications prior to visit.    PAST MEDICAL HISTORY: Past Medical History:  Diagnosis Date  . Arthritis   . Atrial fibrillation (HCC)   . Diabetes mellitus without complication (HCC)   . Hypercholesterolemia   . Hypertension   . Kidney stones   . MI (myocardial infarction) (HCC)   . Stroke (HCC) 2010  . Tremor     PAST SURGICAL HISTORY: Past Surgical History:  Procedure Laterality Date  . cardiac stents  2005  . CHOLECYSTECTOMY  2007  . KIDNEY STONE SURGERY  1997-2003    FAMILY HISTORY: Family History  Problem Relation Age of Onset  . Hypertension Mother   . Stroke Mother   . Diabetes Father   . Cancer Sister   . Diabetes Brother   .  Cancer Brother     SOCIAL HISTORY: Social History   Socioeconomic History  . Marital status: Single    Spouse name: Not on file  . Number of children: 0  . Years of education: 66  . Highest education level: Not on file  Occupational History    Comment: [employer]  Tobacco Use  . Smoking status: Never Smoker  . Smokeless tobacco: Never Used  Substance and Sexual Activity  . Alcohol use: Never  . Drug use: Never  . Sexual activity: Not on file  Other Topics Concern  . Not on file  Social History Narrative   Lives alone   Social Determinants of Health   Financial Resource Strain: Not on file  Food  Insecurity: Not on file  Transportation Needs: Not on file  Physical Activity: Not on file  Stress: Not on file  Social Connections: Not on file  Intimate Partner Violence: Not on file     PHYSICAL EXAM  GENERAL EXAM/CONSTITUTIONAL: Vitals:  Vitals:   12/29/20 0958  BP: (!) 174/94  Pulse: 76  Weight: 229 lb (103.9 kg)  Height: 6\' 1"  (1.854 m)   Body mass index is 30.21 kg/m. Wt Readings from Last 3 Encounters:  12/29/20 229 lb (103.9 kg)    Patient is in no distress; well developed, nourished and groomed; neck is supple  CARDIOVASCULAR:  Examination of carotid arteries is normal; no carotid bruits  Regular rate and rhythm, no murmurs  Examination of peripheral vascular system by observation and palpation is normal  EYES:  Ophthalmoscopic exam of optic discs and posterior segments is normal; no papilledema or hemorrhages No exam data present  MUSCULOSKELETAL:  Gait, strength, tone, movements noted in Neurologic exam below  NEUROLOGIC: MENTAL STATUS:  No flowsheet data found.  awake, alert, oriented to person, place and time  recent and remote memory intact  normal attention and concentration  language fluent, comprehension intact, naming intact  fund of knowledge appropriate  CRANIAL NERVE:   2nd - no papilledema on fundoscopic exam  2nd, 3rd, 4th, 6th - pupils equal and reactive to light, visual fields full to confrontation, extraocular muscles intact, no nystagmus  5th - facial sensation symmetric  7th - facial strength symmetric  8th - hearing intact  9th - palate elevates symmetrically, uvula midline  11th - shoulder shrug symmetric  12th - tongue protrusion midline  MOTOR:   normal bulk and tone, full strength in the BUE, BLE  MILD RIGHT ARM POSTURAL AND ACTION TREMOR  SENSORY:   normal and symmetric to light touch, temperature, vibration  COORDINATION:   finger-nose-finger, fine finger movements normal  REFLEXES:   deep  tendon reflexes present and symmetric  GAIT/STATION:   narrow based gait     DIAGNOSTIC DATA (LABS, IMAGING, TESTING) - I reviewed patient records, labs, notes, testing and imaging myself where available.  Lab Results  Component Value Date   WBC 7.4 01/07/2014   HGB 17.5 (H) 01/07/2014   HCT 47.7 01/07/2014   MCV 91.7 01/07/2014   PLT 204 01/07/2014      Component Value Date/Time   NA 139 01/07/2014 1900   K 4.1 01/07/2014 1900   CL 99 01/07/2014 1900   CO2 25 01/07/2014 1900   GLUCOSE 170 (H) 01/07/2014 1900   BUN 17 01/07/2014 1900   CREATININE 0.80 01/07/2014 1900   CALCIUM 9.9 01/07/2014 1900   PROT 8.0 01/07/2014 1900   ALBUMIN 4.5 01/07/2014 1900   AST 40 (H) 01/07/2014  1900   ALT 37 01/07/2014 1900   ALKPHOS 113 01/07/2014 1900   BILITOT 0.5 01/07/2014 1900   GFRNONAA >90 01/07/2014 1900   GFRAA >90 01/07/2014 1900   Lab Results  Component Value Date   CHOL  03/21/2008    86        ATP III CLASSIFICATION:  <200     mg/dL   Desirable  062-694  mg/dL   Borderline High  >=854    mg/dL   High   HDL 33 (L) 62/70/3500   LDLCALC  03/21/2008    41        Total Cholesterol/HDL:CHD Risk Coronary Heart Disease Risk Table                     Men   Women  1/2 Average Risk   3.4   3.3   TRIG 60 03/21/2008   CHOLHDL 2.6 03/21/2008   Lab Results  Component Value Date   HGBA1C (H) 03/20/2008    6.3 (NOTE)   The ADA recommends the following therapeutic goals for glycemic   control related to Hgb A1C measurement:   Goal of Therapy:   < 7.0% Hgb A1C   Action Suggested:  > 8.0% Hgb A1C   Ref:  Diabetes Care, 22, Suppl. 1, 1999   No results found for: VITAMINB12 Lab Results  Component Value Date   TSH 0.925 Test methodology is 3rd generation TSH 03/20/2008       ASSESSMENT AND PLAN  70 y.o. year old male here with:  Dx:  1. Essential tremor   2. Right arm weakness   3. Right leg weakness     PLAN:  WORSENING RIGHT ARM / RIGHT LEG WEAKNESS (since  2021) - check MRI brain   ESSENTIAL TREMOR (since 2017) - start primidone 25mg  at bedtime; then increase to 25mg  twice a day; then increase to 50mg  twice a day   DIABETIC NERVE PAIN - continue gabapentin 300mg  at bedtime  Orders Placed This Encounter  Procedures  . MR BRAIN WO CONTRAST    Meds ordered this encounter  Medications  . primidone (MYSOLINE) 50 MG tablet    Sig: Take 1 tablet (50 mg total) by mouth in the morning and at bedtime.    Dispense:  60 tablet    Refill:  5    Return in about 6 months (around 07/01/2021).    , MD 12/29/2020, 10:55 AM Certified in Neurology, Neurophysiology and Neuroimaging  [facility] [address] [place], 02/28/2021 [facility] [phone]

## 2020-12-30 ENCOUNTER — Telehealth: Payer: Self-pay | Admitting: Diagnostic Neuroimaging

## 2020-12-30 NOTE — Telephone Encounter (Signed)
[employer] pending uploaded notes on the portal  

## 2020-12-30 NOTE — Telephone Encounter (Signed)
Cory Brown: [id number] (exp. 12/30/20 to 01/29/21) order sent to GI. They will reach out to the patient to schedule.

## 2021-07-06 ENCOUNTER — Encounter: Payer: Self-pay | Admitting: Diagnostic Neuroimaging

## 2021-07-06 ENCOUNTER — Ambulatory Visit: Payer: BC Managed Care – PPO | Admitting: Diagnostic Neuroimaging

## 2021-09-17 DIAGNOSIS — E1165 Type 2 diabetes mellitus with hyperglycemia: Secondary | ICD-10-CM

## 2021-09-17 DIAGNOSIS — I482 Chronic atrial fibrillation, unspecified: Secondary | ICD-10-CM

## 2021-09-17 DIAGNOSIS — R531 Weakness: Secondary | ICD-10-CM

## 2021-09-17 DIAGNOSIS — T796XXA Traumatic ischemia of muscle, initial encounter: Secondary | ICD-10-CM

## 2021-09-17 DIAGNOSIS — E871 Hypo-osmolality and hyponatremia: Secondary | ICD-10-CM

## 2021-09-17 HISTORY — DX: Hypo-osmolality and hyponatremia: E87.1

## 2021-09-17 HISTORY — DX: Type 2 diabetes mellitus with hyperglycemia: E11.65

## 2021-09-17 HISTORY — DX: Chronic atrial fibrillation, unspecified: I48.20

## 2021-09-17 HISTORY — DX: Traumatic ischemia of muscle, initial encounter: T79.6XXA

## 2021-09-17 HISTORY — DX: Weakness: R53.1

## 2021-09-18 DIAGNOSIS — D696 Thrombocytopenia, unspecified: Secondary | ICD-10-CM | POA: Insufficient documentation

## 2021-09-18 HISTORY — DX: Thrombocytopenia, unspecified: D69.6

## 2021-09-19 DIAGNOSIS — M6282 Rhabdomyolysis: Secondary | ICD-10-CM | POA: Insufficient documentation

## 2021-09-19 HISTORY — DX: Rhabdomyolysis: M62.82

## 2021-12-05 ENCOUNTER — Inpatient Hospital Stay (HOSPITAL_COMMUNITY)
Admission: EM | Admit: 2021-12-05 | Discharge: 2021-12-08 | DRG: 024 | Disposition: A | Discharge status: Home / Self Care | Payer: Medicare PPO | Attending: Neurology | Admitting: Neurology

## 2021-12-05 ENCOUNTER — Encounter (HOSPITAL_COMMUNITY)
Admission: EM | Disposition: A | Discharge status: Home / Self Care | Payer: Self-pay | Source: Home / Self Care | Attending: Neurology

## 2021-12-05 ENCOUNTER — Emergency Department (HOSPITAL_COMMUNITY): Payer: Medicare PPO

## 2021-12-05 ENCOUNTER — Encounter (HOSPITAL_COMMUNITY): Payer: Self-pay | Admitting: Certified Registered"

## 2021-12-05 ENCOUNTER — Inpatient Hospital Stay (HOSPITAL_COMMUNITY): Payer: Medicare PPO | Admitting: Certified Registered Nurse Anesthetist

## 2021-12-05 ENCOUNTER — Encounter (HOSPITAL_COMMUNITY): Payer: Self-pay | Admitting: Internal Medicine

## 2021-12-05 ENCOUNTER — Inpatient Hospital Stay (HOSPITAL_COMMUNITY): Payer: Medicare PPO

## 2021-12-05 DIAGNOSIS — H518 Other specified disorders of binocular movement: Secondary | ICD-10-CM | POA: Diagnosis present

## 2021-12-05 DIAGNOSIS — Z79899 Other long term (current) drug therapy: Secondary | ICD-10-CM

## 2021-12-05 DIAGNOSIS — I2510 Atherosclerotic heart disease of native coronary artery without angina pectoris: Secondary | ICD-10-CM | POA: Diagnosis present

## 2021-12-05 DIAGNOSIS — Z9049 Acquired absence of other specified parts of digestive tract: Secondary | ICD-10-CM | POA: Diagnosis not present

## 2021-12-05 DIAGNOSIS — R29724 NIHSS score 24: Secondary | ICD-10-CM | POA: Diagnosis not present

## 2021-12-05 DIAGNOSIS — M1990 Unspecified osteoarthritis, unspecified site: Secondary | ICD-10-CM | POA: Diagnosis present

## 2021-12-05 DIAGNOSIS — Z8249 Family history of ischemic heart disease and other diseases of the circulatory system: Secondary | ICD-10-CM | POA: Diagnosis not present

## 2021-12-05 DIAGNOSIS — Z794 Long term (current) use of insulin: Secondary | ICD-10-CM

## 2021-12-05 DIAGNOSIS — G8191 Hemiplegia, unspecified affecting right dominant side: Secondary | ICD-10-CM | POA: Diagnosis present

## 2021-12-05 DIAGNOSIS — Z833 Family history of diabetes mellitus: Secondary | ICD-10-CM | POA: Diagnosis not present

## 2021-12-05 DIAGNOSIS — I252 Old myocardial infarction: Secondary | ICD-10-CM

## 2021-12-05 DIAGNOSIS — E7800 Pure hypercholesterolemia, unspecified: Secondary | ICD-10-CM | POA: Diagnosis present

## 2021-12-05 DIAGNOSIS — Z955 Presence of coronary angioplasty implant and graft: Secondary | ICD-10-CM

## 2021-12-05 DIAGNOSIS — I6389 Other cerebral infarction: Secondary | ICD-10-CM | POA: Diagnosis not present

## 2021-12-05 DIAGNOSIS — R29719 NIHSS score 19: Secondary | ICD-10-CM | POA: Diagnosis present

## 2021-12-05 DIAGNOSIS — I4891 Unspecified atrial fibrillation: Secondary | ICD-10-CM | POA: Diagnosis present

## 2021-12-05 DIAGNOSIS — I4821 Permanent atrial fibrillation: Secondary | ICD-10-CM | POA: Diagnosis not present

## 2021-12-05 DIAGNOSIS — I480 Paroxysmal atrial fibrillation: Secondary | ICD-10-CM | POA: Diagnosis not present

## 2021-12-05 DIAGNOSIS — Z823 Family history of stroke: Secondary | ICD-10-CM | POA: Diagnosis not present

## 2021-12-05 DIAGNOSIS — R4701 Aphasia: Secondary | ICD-10-CM | POA: Diagnosis present

## 2021-12-05 DIAGNOSIS — E11649 Type 2 diabetes mellitus with hypoglycemia without coma: Secondary | ICD-10-CM | POA: Diagnosis not present

## 2021-12-05 DIAGNOSIS — G250 Essential tremor: Secondary | ICD-10-CM | POA: Diagnosis present

## 2021-12-05 DIAGNOSIS — R29725 NIHSS score 25: Secondary | ICD-10-CM | POA: Diagnosis not present

## 2021-12-05 DIAGNOSIS — I10 Essential (primary) hypertension: Secondary | ICD-10-CM | POA: Diagnosis present

## 2021-12-05 DIAGNOSIS — E119 Type 2 diabetes mellitus without complications: Secondary | ICD-10-CM | POA: Diagnosis present

## 2021-12-05 DIAGNOSIS — I639 Cerebral infarction, unspecified: Secondary | ICD-10-CM | POA: Diagnosis present

## 2021-12-05 DIAGNOSIS — R471 Dysarthria and anarthria: Secondary | ICD-10-CM | POA: Diagnosis present

## 2021-12-05 DIAGNOSIS — I63512 Cerebral infarction due to unspecified occlusion or stenosis of left middle cerebral artery: Principal | ICD-10-CM | POA: Diagnosis present

## 2021-12-05 DIAGNOSIS — J9600 Acute respiratory failure, unspecified whether with hypoxia or hypercapnia: Secondary | ICD-10-CM | POA: Diagnosis present

## 2021-12-05 DIAGNOSIS — R29720 NIHSS score 20: Secondary | ICD-10-CM | POA: Diagnosis not present

## 2021-12-05 DIAGNOSIS — I161 Hypertensive emergency: Secondary | ICD-10-CM | POA: Diagnosis present

## 2021-12-05 DIAGNOSIS — Z20822 Contact with and (suspected) exposure to covid-19: Secondary | ICD-10-CM | POA: Diagnosis present

## 2021-12-05 DIAGNOSIS — Z87442 Personal history of urinary calculi: Secondary | ICD-10-CM | POA: Diagnosis not present

## 2021-12-05 DIAGNOSIS — R29810 Facial weakness: Secondary | ICD-10-CM | POA: Diagnosis present

## 2021-12-05 DIAGNOSIS — Z7984 Long term (current) use of oral hypoglycemic drugs: Secondary | ICD-10-CM

## 2021-12-05 DIAGNOSIS — Z7902 Long term (current) use of antithrombotics/antiplatelets: Secondary | ICD-10-CM

## 2021-12-05 DIAGNOSIS — Z7982 Long term (current) use of aspirin: Secondary | ICD-10-CM

## 2021-12-05 HISTORY — PX: RADIOLOGY WITH ANESTHESIA: SHX6223

## 2021-12-05 HISTORY — PX: IR CT HEAD LTD: IMG2386

## 2021-12-05 HISTORY — PX: IR PERCUTANEOUS ART THROMBECTOMY/INFUSION INTRACRANIAL INC DIAG ANGIO: IMG6087

## 2021-12-05 HISTORY — DX: Cerebral infarction, unspecified: I63.9

## 2021-12-05 LAB — COMPREHENSIVE METABOLIC PANEL
ALT: 27 U/L (ref 0–44)
AST: 27 U/L (ref 15–41)
Albumin: 3.9 g/dL (ref 3.5–5.0)
Alkaline Phosphatase: 81 U/L (ref 38–126)
Anion gap: 8 (ref 5–15)
BUN: 23 mg/dL (ref 8–23)
CO2: 25 mmol/L (ref 22–32)
Calcium: 9.4 mg/dL (ref 8.9–10.3)
Chloride: 108 mmol/L (ref 98–111)
Creatinine, Ser: 1.24 mg/dL (ref 0.61–1.24)
GFR, Estimated: >60 mL/min (ref >60)
Glucose, Bld: 126 mg/dL — ABNORMAL HIGH (ref 70–99)
Potassium: 4.2 mmol/L (ref 3.5–5.1)
Sodium: 141 mmol/L (ref 135–145)
Total Bilirubin: 1 mg/dL (ref 0.3–1.2)
Total Protein: 6.6 g/dL (ref 6.5–8.1)

## 2021-12-05 LAB — I-STAT CHEM 8, ED
BUN: 28 mg/dL — ABNORMAL HIGH (ref 8–23)
Calcium, Ion: 1.12 mmol/L — ABNORMAL LOW (ref 1.15–1.40)
Chloride: 107 mmol/L (ref 98–111)
Creatinine, Ser: 1.2 mg/dL (ref 0.61–1.24)
Glucose, Bld: 119 mg/dL — ABNORMAL HIGH (ref 70–99)
HCT: 47 % (ref 39.0–52.0)
Hemoglobin: 16 g/dL (ref 13.0–17.0)
Potassium: 4.2 mmol/L (ref 3.5–5.1)
Sodium: 142 mmol/L (ref 135–145)
TCO2: 26 mmol/L (ref 22–32)

## 2021-12-05 LAB — CBC
HCT: 47.7 % (ref 39.0–52.0)
Hemoglobin: 16 g/dL (ref 13.0–17.0)
MCH: 32.9 pg (ref 26.0–34.0)
MCHC: 33.5 g/dL (ref 30.0–36.0)
MCV: 97.9 fL (ref 80.0–100.0)
Platelets: 177 10*3/uL (ref 150–400)
RBC: 4.87 MIL/uL (ref 4.22–5.81)
RDW: 13 % (ref 11.5–15.5)
WBC: 5.8 10*3/uL (ref 4.0–10.5)
nRBC: 0 % (ref 0.0–0.2)

## 2021-12-05 LAB — DIFFERENTIAL
Abs Immature Granulocytes: 0.01 10*3/uL (ref 0.00–0.07)
Basophils Absolute: 0 10*3/uL (ref 0.0–0.1)
Basophils Relative: 1 %
Eosinophils Absolute: 0.2 10*3/uL (ref 0.0–0.5)
Eosinophils Relative: 4 %
Immature Granulocytes: 0 %
Lymphocytes Relative: 31 %
Lymphs Abs: 1.8 10*3/uL (ref 0.7–4.0)
Monocytes Absolute: 0.6 10*3/uL (ref 0.1–1.0)
Monocytes Relative: 10 %
Neutro Abs: 3.1 10*3/uL (ref 1.7–7.7)
Neutrophils Relative %: 54 %

## 2021-12-05 LAB — MRSA NEXT GEN BY PCR, NASAL: MRSA by PCR Next Gen: DETECTED — AB

## 2021-12-05 LAB — CBG MONITORING, ED: Glucose-Capillary: 120 mg/dL — ABNORMAL HIGH (ref 70–99)

## 2021-12-05 LAB — RESP PANEL BY RT-PCR (FLU A&B, COVID) ARPGX2
Influenza A by PCR: NEGATIVE
Influenza B by PCR: NEGATIVE
SARS Coronavirus 2 by RT PCR: NEGATIVE

## 2021-12-05 LAB — PROTIME-INR
INR: 1.1 (ref 0.8–1.2)
Prothrombin Time: 14.1 seconds (ref 11.4–15.2)

## 2021-12-05 LAB — APTT: aPTT: 30 seconds (ref 24–36)

## 2021-12-05 LAB — GLUCOSE, CAPILLARY
Glucose-Capillary: 133 mg/dL — ABNORMAL HIGH (ref 70–99)
Glucose-Capillary: 157 mg/dL — ABNORMAL HIGH (ref 70–99)

## 2021-12-05 LAB — ETHANOL: Alcohol, Ethyl (B): <10 mg/dL (ref <10)

## 2021-12-05 SURGERY — IR WITH ANESTHESIA
Anesthesia: General

## 2021-12-05 SURGERY — RADIOLOGY WITH ANESTHESIA
Anesthesia: General

## 2021-12-05 MED ORDER — EPTIFIBATIDE 20 MG/10ML IV SOLN
INTRAVENOUS | Status: AC
  Filled 2021-12-05: qty 10

## 2021-12-05 MED ORDER — FENTANYL CITRATE (PF) 100 MCG/2ML IJ SOLN: 25.0000 ug | INTRAMUSCULAR | Status: DC | PRN

## 2021-12-05 MED ORDER — PROPOFOL 500 MG/50ML IV EMUL
INTRAVENOUS | Status: DC | PRN
  Administered 2021-12-05: 30 ug/kg/min via INTRAVENOUS

## 2021-12-05 MED ORDER — LACTATED RINGERS IV SOLN: INTRAVENOUS | Status: DC | PRN

## 2021-12-05 MED ORDER — CANGRELOR TETRASODIUM 50 MG IV SOLR
INTRAVENOUS | Status: AC
  Filled 2021-12-05: qty 50

## 2021-12-05 MED ORDER — SUCCINYLCHOLINE CHLORIDE 200 MG/10ML IV SOSY
PREFILLED_SYRINGE | INTRAVENOUS | Status: DC | PRN
  Administered 2021-12-05: 150 mg via INTRAVENOUS

## 2021-12-05 MED ORDER — CLEVIDIPINE BUTYRATE 0.5 MG/ML IV EMUL
INTRAVENOUS | Status: AC
  Filled 2021-12-05: qty 50

## 2021-12-05 MED ORDER — OXYCODONE HCL 5 MG/5ML PO SOLN: 5.0000 mg | Freq: Once | ORAL | Status: DC | PRN

## 2021-12-05 MED ORDER — SENNOSIDES-DOCUSATE SODIUM 8.6-50 MG PO TABS: 1.0000 | ORAL_TABLET | Freq: Every evening | ORAL | Status: DC | PRN

## 2021-12-05 MED ORDER — ACETAMINOPHEN 160 MG/5ML PO SOLN: 650.0000 mg | ORAL | Status: DC | PRN

## 2021-12-05 MED ORDER — SUGAMMADEX SODIUM 200 MG/2ML IV SOLN
INTRAVENOUS | Status: DC | PRN
  Administered 2021-12-05: 200 mg via INTRAVENOUS

## 2021-12-05 MED ORDER — LIDOCAINE 2% (20 MG/ML) 5 ML SYRINGE
INTRAMUSCULAR | Status: DC | PRN
  Administered 2021-12-05: 60 mg via INTRAVENOUS

## 2021-12-05 MED ORDER — SODIUM CHLORIDE 0.9 % IV SOLN: INTRAVENOUS | Status: DC

## 2021-12-05 MED ORDER — FENTANYL CITRATE (PF) 100 MCG/2ML IJ SOLN
INTRAMUSCULAR | Status: AC
  Filled 2021-12-05: qty 2

## 2021-12-05 MED ORDER — ASPIRIN 81 MG PO CHEW
CHEWABLE_TABLET | ORAL | Status: AC
  Filled 2021-12-05: qty 1

## 2021-12-05 MED ORDER — PHENYLEPHRINE HCL-NACL 20-0.9 MG/250ML-% IV SOLN
INTRAVENOUS | Status: DC | PRN
  Administered 2021-12-05: 50 ug/min via INTRAVENOUS

## 2021-12-05 MED ORDER — FENTANYL CITRATE (PF) 100 MCG/2ML IJ SOLN
INTRAMUSCULAR | Status: DC | PRN
  Administered 2021-12-05 (×2): 50 ug via INTRAVENOUS

## 2021-12-05 MED ORDER — LORAZEPAM 2 MG/ML IJ SOLN
1.0000 mg | Freq: Once | INTRAMUSCULAR | Status: AC
  Administered 2021-12-05: 1 mg via INTRAVENOUS
  Filled 2021-12-05: qty 1

## 2021-12-05 MED ORDER — ONDANSETRON HCL 4 MG/2ML IJ SOLN
INTRAMUSCULAR | Status: DC | PRN
  Administered 2021-12-05: 4 mg via INTRAVENOUS

## 2021-12-05 MED ORDER — CLEVIDIPINE BUTYRATE 0.5 MG/ML IV EMUL
0.0000 mg/h | INTRAVENOUS | Status: DC
  Administered 2021-12-05: 5 mg/h via INTRAVENOUS
  Administered 2021-12-06: 9 mg/h via INTRAVENOUS
  Administered 2021-12-06: 8 mg/h via INTRAVENOUS
  Administered 2021-12-06: 7 mg/h via INTRAVENOUS
  Administered 2021-12-06: 8 mg/h via INTRAVENOUS
  Administered 2021-12-06: 7 mg/h via INTRAVENOUS
  Administered 2021-12-06: 6 mg/h via INTRAVENOUS
  Administered 2021-12-06: 8 mg/h via INTRAVENOUS
  Filled 2021-12-05 (×12): qty 50

## 2021-12-05 MED ORDER — PROPOFOL 10 MG/ML IV BOLUS
INTRAVENOUS | Status: DC | PRN
Start: 2021-12-05 — End: 2021-12-05
  Administered 2021-12-05: 200 mg via INTRAVENOUS

## 2021-12-05 MED ORDER — OXYCODONE HCL 5 MG PO TABS: 5.0000 mg | ORAL_TABLET | Freq: Once | ORAL | Status: DC | PRN

## 2021-12-05 MED ORDER — ACETAMINOPHEN 325 MG PO TABS: 650.0000 mg | ORAL_TABLET | ORAL | Status: DC | PRN

## 2021-12-05 MED ORDER — FENTANYL CITRATE (PF) 100 MCG/2ML IJ SOLN
12.5000 ug | Freq: Once | INTRAMUSCULAR | Status: AC
  Administered 2021-12-05: 12.5 ug via INTRAVENOUS
  Filled 2021-12-05: qty 2

## 2021-12-05 MED ORDER — TICAGRELOR 90 MG PO TABS
ORAL_TABLET | ORAL | Status: AC
  Filled 2021-12-05: qty 2

## 2021-12-05 MED ORDER — IOHEXOL 350 MG/ML SOLN
75.0000 mL | Freq: Once | INTRAVENOUS | Status: AC | PRN
  Administered 2021-12-05: 75 mL via INTRAVENOUS

## 2021-12-05 MED ORDER — VERAPAMIL HCL 2.5 MG/ML IV SOLN
INTRAVENOUS | Status: AC
  Filled 2021-12-05: qty 2

## 2021-12-05 MED ORDER — TIROFIBAN HCL IN NACL 5-0.9 MG/100ML-% IV SOLN
INTRAVENOUS | Status: AC
  Filled 2021-12-05: qty 100

## 2021-12-05 MED ORDER — PHENYLEPHRINE 40 MCG/ML (10ML) SYRINGE FOR IV PUSH (FOR BLOOD PRESSURE SUPPORT)
PREFILLED_SYRINGE | INTRAVENOUS | Status: DC | PRN
  Administered 2021-12-05 (×2): 80 ug via INTRAVENOUS

## 2021-12-05 MED ORDER — ACETAMINOPHEN 650 MG RE SUPP: 650.0000 mg | RECTAL | Status: DC | PRN

## 2021-12-05 MED ORDER — CHLORHEXIDINE GLUCONATE CLOTH 2 % EX PADS
6.0000 | MEDICATED_PAD | Freq: Every day | CUTANEOUS | Status: DC
  Administered 2021-12-05 – 2021-12-07 (×3): 6 via TOPICAL

## 2021-12-05 MED ORDER — STROKE: EARLY STAGES OF RECOVERY BOOK: Freq: Once | Status: DC

## 2021-12-05 MED ORDER — ONDANSETRON HCL 4 MG/2ML IJ SOLN: 4.0000 mg | Freq: Four times a day (QID) | INTRAMUSCULAR | Status: DC | PRN

## 2021-12-05 MED ORDER — CLOPIDOGREL BISULFATE 300 MG PO TABS
ORAL_TABLET | ORAL | Status: AC
  Filled 2021-12-05: qty 1

## 2021-12-05 MED ORDER — DEXAMETHASONE SODIUM PHOSPHATE 10 MG/ML IJ SOLN
INTRAMUSCULAR | Status: DC | PRN
  Administered 2021-12-05: 5 mg via INTRAVENOUS

## 2021-12-05 MED ORDER — CLEVIDIPINE BUTYRATE 0.5 MG/ML IV EMUL
INTRAVENOUS | Status: DC | PRN
  Administered 2021-12-05: 2 mg/h via INTRAVENOUS

## 2021-12-05 MED ORDER — ROCURONIUM BROMIDE 10 MG/ML (PF) SYRINGE
PREFILLED_SYRINGE | INTRAVENOUS | Status: DC | PRN
  Administered 2021-12-05: 50 mg via INTRAVENOUS

## 2021-12-05 MED ORDER — PANTOPRAZOLE SODIUM 40 MG IV SOLR
40.0000 mg | Freq: Every day | INTRAVENOUS | Status: DC
  Administered 2021-12-05: 40 mg via INTRAVENOUS
  Filled 2021-12-05: qty 40

## 2021-12-05 MED ORDER — INSULIN ASPART 100 UNIT/ML IJ SOLN
0.0000 [IU] | INTRAMUSCULAR | Status: DC
  Administered 2021-12-05: 2 [IU] via SUBCUTANEOUS
  Administered 2021-12-05: 3 [IU] via SUBCUTANEOUS
  Administered 2021-12-06 (×2): 2 [IU] via SUBCUTANEOUS
  Administered 2021-12-07 (×2): 3 [IU] via SUBCUTANEOUS
  Administered 2021-12-07: 2 [IU] via SUBCUTANEOUS
  Administered 2021-12-07: 3 [IU] via SUBCUTANEOUS
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
## 2021-12-05 NOTE — Transfer of Care (Deleted)
Immediate Anesthesia Transfer of Care Note  Patient: Cory Brown  Procedure(s) Performed: RADIOLOGY WITH ANESTHESIA  Patient Location: PACU  Anesthesia Type:General  Level of Consciousness: awake  Airway & Oxygen Therapy: Patient placed on Ventilator (see vital sign flow sheet for setting) and in PACU d/t unknown COVID results. Pt met extubation criteria in PACU and was extubated by CRNA. Currently on Facemask O2  Post-op Assessment: Report given to RN and Post -op Vital signs reviewed and stable  Post vital signs: Reviewed and stable  Last Vitals:  Vitals Value Taken Time  BP 129/83 12/05/21 20:13  Temp    Pulse 79 12/05/21 20:13  Resp 18 12/05/21 20:13  SpO2 100 12/05/21 20:13    Last Pain:  Vitals:   12/05/21 1842  PainSc: 0-No pain         Complications: No notable events documented.

## 2021-12-05 NOTE — Anesthesia Preprocedure Evaluation (Deleted)
Anesthesia Evaluation  Patient identified by MRN, date of birth, ID band Patient confused  Preop documentation limited or incomplete due to emergent nature of procedure.  Airway Mallampati: Unable to assess  TM Distance: >3 FB     Dental   Pulmonary    breath sounds clear to auscultation       Cardiovascular hypertension, + Past MI  + dysrhythmias Atrial Fibrillation  Rhythm:regular Rate:Normal     Neuro/Psych CODE STROKE CVA    GI/Hepatic   Endo/Other  diabetes, Type 2  Renal/GU      Musculoskeletal   Abdominal   Peds  Hematology   Anesthesia Other Findings   Reproductive/Obstetrics                             Anesthesia Physical Anesthesia Plan  ASA: 3 and emergent  Anesthesia Plan: General   Post-op Pain Management:    Induction: Intravenous  PONV Risk Score and Plan: 2  Airway Management Planned: Oral ETT  Additional Equipment:   Intra-op Plan:   Post-operative Plan: Extubation in OR  Informed Consent:   Plan Discussed with: CRNA, Anesthesiologist and Surgeon  Anesthesia Plan Comments:         Anesthesia Quick Evaluation
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
## 2021-12-05 NOTE — Progress Notes (Signed)
eLink Physician-Brief Progress Note Patient Name: JAICE DIGIOIA DOB: [birth year] MRN: [id number]   Date of Service  12/05/2021  HPI/Events of Note  71 y.o. male arriving to [facility] ED via [facility] on 12-05-2021 with past medical hx of MI, AF, HTN, DM. On Pradaxa (dabigatran) twice a day. Code stroke was activated by EMS.   Occlusion of the distal left M1/MCA. Mechanical thrombectomy performed with direct contact aspiration with complete recanalization after one pass (TICI3). No evidence of thromboembolic or hemorrhagic complication.  Data: H and p, notes, labs reviewed Cr 1.2  Camera Just rolled in to ICU. On nasal o2. Follows commands. HR 99, 96% sats. MAP 105, SBP 156. On Clevidipine gtt.    eICU Interventions  - continue neuro checks, asp precautions. MRI at mid night.  - CBG goals < 180, SSI.  - Neurology on board - s/p extubation on nasal o2. Watch for new fever. Aspiration event A fib rate controlled. - SCD. Pradexa on hold.        Intervention Category Major Interventions: Change in mental status - evaluation and management;Other: (CVA mechanical thrombectomy) Evaluation Type: New Patient Evaluation  Ranee Gosselin 12/05/2021, 9:26 PM

## 2021-12-05 NOTE — ED Provider Notes (Signed)
Pt presented as a code stroke from EMS.  Pt seen by stroke team and taken immediately to the CT scanner from the bridge.  Pt found to have large vessel occlusion.  Pt went directly from CT scanner to IR suite for further treatment under care of stroke team.  No evaluation by me.    Dorie Rank, MD 12/05/21 [phone]

## 2021-12-05 NOTE — Procedures (Addendum)
INTERVENTIONAL NEURORADIOLOGY BRIEF POSTPROCEDURE NOTE  DIAGNOSTIC CEREBRAL ANGIOGRAM AND MECHANICAL THROMBECTOMY  Attending: Dr. Baldemar Lenis  Assistant: None  Diagnosis: Left MCA occlusion  Access site: Right common femoral artery  Access closure: Perclose Prostyle  Anesthesia: GETA  Medication used: Refer to anesthesia documentation.  Complications: None.  Estimated blood loss: 50 mL  Specimen: None.  Findings: Occlusion of the distal left M1/MCA. Mechanical thrombectomy performed with direct contact aspiration with complete recanalization after one pass (TICI3). No evidence of thromboembolic or hemorrhagic complication.  The patient tolerated the procedure well without incident or complication and is in stable condition.   PLAN: - Bed rest x 6 h s/p femoral puncture. - SBP 120-140 mmHg. - Patient remains intubated due to no covid test result. Plan to extubate as soon as possible.

## 2021-12-05 NOTE — Sedation Documentation (Signed)
Bedside report given to PACU RN. Dressing to right femoral and pulses checked.

## 2021-12-05 NOTE — Progress Notes (Signed)
Pt increasingly anxious. HR 110s-130s with max of 160. A fib. Can now say few words, but slurring. NIH improving.

## 2021-12-05 NOTE — Transfer of Care (Signed)
Immediate Anesthesia Transfer of Care Note  Patient: Cory Brown  Procedure(s) Performed: IR WITH ANESTHESIA  Patient Location: PACU  Anesthesia Type:General  Level of Consciousness: awake  Airway & Oxygen Therapy: Patient placed on Ventilator (see vital sign flow sheet for setting) and in PACU d/t unknown COVID results. Pt met extubation criteria in PACU and was extubated by CRNA. Currently on Facemask O2  Post-op Assessment: Report given to RN and Post -op Vital signs reviewed and stable  Post vital signs: Reviewed and stable  Last Vitals:  Vitals Value Taken Time  BP 129/83 12/05/21 2013  Temp 36.4 C 12/05/21 2013  Pulse 79 12/05/21 2013  Resp 18 12/05/21 2013  SpO2 100 % 12/05/21 2013  Vitals shown include unvalidated device data.  Last Pain:  Vitals:   12/05/21 1842  PainSc: 0-No pain         Complications: No notable events documented.

## 2021-12-05 NOTE — Anesthesia Procedure Notes (Signed)
Procedure Name: Intubation Date/Time: 12/05/2021 6:49 PM Performed by: Reece Agar, CRNA Pre-anesthesia Checklist: Patient identified, Emergency Drugs available, Suction available and Patient being monitored Patient Re-evaluated:Patient Re-evaluated prior to induction Oxygen Delivery Method: Circle System Utilized Preoxygenation: Pre-oxygenation with 100% oxygen Induction Type: IV induction, Rapid sequence and Cricoid Pressure applied Laryngoscope Size: Glidescope and 4 Grade View: Grade I Tube type: Oral Tube size: 7.5 mm Number of attempts: 1 Airway Equipment and Method: Stylet and Video-laryngoscopy Placement Confirmation: ETT inserted through vocal cords under direct vision, positive ETCO2 and breath sounds checked- equal and bilateral Secured at: 23 cm Tube secured with: Tape Dental Injury: Teeth and Oropharynx as per pre-operative assessment  Comments: Code stroke, COVID status unknown. Pt RSI with glidescope.

## 2021-12-05 NOTE — Consult Note (Incomplete)
NAME:  Cory Brown, MRN:  [id number], DOB:  04-29-1951, LOS: 0 ADMISSION DATE:  12/05/2021, CONSULTATION DATE:  *** REFERRING MD:  ***, CHIEF COMPLAINT:  ***   History of Present Illness:  ***  Pertinent  Medical History  ***  Significant Hospital Events: Including procedures, antibiotic start and stop dates in addition to other pertinent events     Interim History / Subjective:  ***  Objective   Blood pressure (!) 166/93, pulse 85, resp. rate 16, weight 99.4 kg, SpO2 97 %.       No intake or output data in the 24 hours ending 12/05/21 2009 Filed Weights   12/05/21 1807  Weight: 99.4 kg    Examination: General: *** HENT: *** Lungs: *** Cardiovascular: *** Abdomen: *** Extremities: *** Neuro: *** GU: ***  Resolved Hospital Problem list   ***  Assessment & Plan:  ***  Best Practice (right click and "Reselect all SmartList Selections" daily)   Diet/type: {diet type:25684} DVT prophylaxis: {anticoagulation (Optional):25687} GI prophylaxis: [id number] Lines: {Central Venous Access:25771} Foley:  {Central Venous Access:25691} Code Status:  {Code Status:26939} Last date of multidisciplinary goals of care discussion [***]  Labs   CBC: Recent Labs  Lab 12/05/21 1759 12/05/21 1806  WBC 5.8  --   NEUTROABS 3.1  --   HGB 16.0 16.0  HCT 47.7 47.0  MCV 97.9  --   PLT 177  --     Basic Metabolic Panel: Recent Labs  Lab 12/05/21 1806  NA 142  K 4.2  CL 107  GLUCOSE 119*  BUN 28*  CREATININE 1.20   GFR: CrCl cannot be calculated (Unknown ideal weight.). Recent Labs  Lab 12/05/21 1759  WBC 5.8    Liver Function Tests: No results for input(s): AST, ALT, ALKPHOS, BILITOT, PROT, ALBUMIN in the last 168 hours. No results for input(s): LIPASE, AMYLASE in the last 168 hours. No results for input(s): AMMONIA in the last 168 hours.  ABG    Component Value Date/Time   TCO2 26 12/05/2021 1806     Coagulation Profile: Recent Labs  Lab  12/05/21 1759  INR 1.1    Cardiac Enzymes: No results for input(s): CKTOTAL, CKMB, CKMBINDEX, TROPONINI in the last 168 hours.  HbA1C: Hgb A1c MFr Bld  Date/Time Value Ref Range Status  03/20/2008 07:06 AM (H)  Final   6.3 (NOTE)   The ADA recommends the following therapeutic goals for glycemic   control related to Hgb A1C measurement:   Goal of Therapy:   < 7.0% Hgb A1C   Action Suggested:  > 8.0% Hgb A1C   Ref:  Diabetes Care, 22, Suppl. 1, 1999    CBG: Recent Labs  Lab 12/05/21 1757  GLUCAP 120*    Review of Systems:   ***  Past Medical History:  He,  has a past medical history of Arthritis, Atrial fibrillation ([place]), Diabetes mellitus without complication ([place]), Hypercholesterolemia, Hypertension, Kidney stones, MI (myocardial infarction) ([place]), Stroke ([place]) (2010), and Tremor.   Surgical History:   Past Surgical History:  Procedure Laterality Date   cardiac stents  2005   CHOLECYSTECTOMY  2007   KIDNEY STONE SURGERY  1997-2003     Social History:   reports that he has never smoked. He has never used smokeless tobacco. He reports that he does not drink alcohol and does not use drugs.   Family History:  His family history includes Cancer in his brother and sister; Diabetes in his brother and father; Hypertension in his  mother; Stroke in his mother.   Allergies No Known Allergies   Home Medications  Prior to Admission medications   Medication Sig Start Date End Date Taking? Authorizing Provider  amLODipine (NORVASC) 5 MG tablet Take 1 tablet by mouth daily. 12/08/20   [provider]  aspirin 81 MG EC tablet Take 1 tablet by mouth daily. 09/08/15   [provider]  dabigatran (PRADAXA) 150 MG CAPS capsule TAKE ONE CAPSULE BY MOUTH TWICE A DAY (CAUTION: ANTICOAGULANT) 03/31/20   [provider]  glimepiride (AMARYL) 4 MG tablet TAKE ONE TABLET BY MOUTH ONCE A DAY FOR DIABETES 03/31/20   [provider]  guaifenesin (HUMIBID E) 400 MG  TABS tablet Take 1 tablet by mouth 2 (two) times daily. 12/08/20   [provider]  insulin glargine (LANTUS) 100 UNIT/ML injection INJECT 14 UNITS SUBCUTANEOUSLY AT BEDTIME FOR DIABETES (USE WITHIN 28 DAYS AFTER OPENING VIAL) MAKE SURE TO ROTATE INJECTION SITE 03/31/20   [provider]  loratadine (CLARITIN) 10 MG tablet Take 1 tablet by mouth daily. 10/05/20   [provider]  losartan (COZAAR) 100 MG tablet TAKE ONE TABLET BY MOUTH ONCE A DAY FOR BLOOD PRESSURE AND HEART. (REPLACES LISINOPRL 40MG ) 03/31/20   [provider]  metFORMIN (GLUCOPHAGE) 500 MG tablet Take by mouth 2 (two) times daily with a meal.    [provider]  metoprolol tartrate (LOPRESSOR) 100 MG tablet TAKE ONE TABLET BY MOUTH TWICE A DAY FOR HEART, BLOOD PRESSURE, HEART RATE. (REPLACES PROPRANOLOL) 03/31/20   [provider]  primidone (MYSOLINE) 50 MG tablet Take 1 tablet (50 mg total) by mouth in the morning and at bedtime. 12/29/20   Penumalli, Earlean Polka, MD  rosuvastatin (CRESTOR) 10 MG tablet Take 10 mg by mouth daily.    [provider]     Critical care time: ***
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
## 2021-12-06 ENCOUNTER — Encounter (HOSPITAL_COMMUNITY): Payer: Self-pay | Admitting: Radiology

## 2021-12-06 ENCOUNTER — Inpatient Hospital Stay (HOSPITAL_COMMUNITY): Payer: Medicare PPO

## 2021-12-06 ENCOUNTER — Other Ambulatory Visit (HOSPITAL_COMMUNITY): Payer: Medicare PPO

## 2021-12-06 ENCOUNTER — Other Ambulatory Visit (HOSPITAL_COMMUNITY): Payer: BC Managed Care – PPO

## 2021-12-06 DIAGNOSIS — I6389 Other cerebral infarction: Secondary | ICD-10-CM

## 2021-12-06 DIAGNOSIS — E11649 Type 2 diabetes mellitus with hypoglycemia without coma: Secondary | ICD-10-CM

## 2021-12-06 DIAGNOSIS — I639 Cerebral infarction, unspecified: Secondary | ICD-10-CM

## 2021-12-06 DIAGNOSIS — I4821 Permanent atrial fibrillation: Secondary | ICD-10-CM

## 2021-12-06 DIAGNOSIS — E7800 Pure hypercholesterolemia, unspecified: Secondary | ICD-10-CM

## 2021-12-06 LAB — COMPREHENSIVE METABOLIC PANEL
ALT: 29 U/L (ref 0–44)
AST: 28 U/L (ref 15–41)
Albumin: 4.2 g/dL (ref 3.5–5.0)
Alkaline Phosphatase: 85 U/L (ref 38–126)
Anion gap: 13 (ref 5–15)
BUN: 23 mg/dL (ref 8–23)
CO2: 20 mmol/L — ABNORMAL LOW (ref 22–32)
Calcium: 9.3 mg/dL (ref 8.9–10.3)
Chloride: 105 mmol/L (ref 98–111)
Creatinine, Ser: 0.79 mg/dL (ref 0.61–1.24)
GFR, Estimated: >60 mL/min (ref >60)
Glucose, Bld: 120 mg/dL — ABNORMAL HIGH (ref 70–99)
Potassium: 4.5 mmol/L (ref 3.5–5.1)
Sodium: 138 mmol/L (ref 135–145)
Total Bilirubin: 1.1 mg/dL (ref 0.3–1.2)
Total Protein: 7.4 g/dL (ref 6.5–8.1)

## 2021-12-06 LAB — ECHOCARDIOGRAM COMPLETE
AR max vel: 3.67 cm2
AV Area VTI: 3.59 cm2
AV Area mean vel: 3.59 cm2
AV Mean grad: 5 mmHg
AV Peak grad: 9.5 mmHg
Ao pk vel: 1.54 m/s
Calc EF: 65.5 %
S' Lateral: 3.6 cm
Single Plane A2C EF: 70.4 %
Single Plane A4C EF: 60.5 %
Weight: 3350.99 oz

## 2021-12-06 LAB — LIPID PANEL
Cholesterol: 151 mg/dL (ref 0–200)
HDL: 44 mg/dL (ref >40)
LDL Cholesterol: 99 mg/dL (ref 0–99)
Total CHOL/HDL Ratio: 3.4 RATIO
Triglycerides: 41 mg/dL (ref <150)
VLDL: 8 mg/dL (ref 0–40)

## 2021-12-06 LAB — CBC
HCT: 47.8 % (ref 39.0–52.0)
Hemoglobin: 16.7 g/dL (ref 13.0–17.0)
MCH: 33.3 pg (ref 26.0–34.0)
MCHC: 34.9 g/dL (ref 30.0–36.0)
MCV: 95.4 fL (ref 80.0–100.0)
Platelets: 161 10*3/uL (ref 150–400)
RBC: 5.01 MIL/uL (ref 4.22–5.81)
RDW: 13.1 % (ref 11.5–15.5)
WBC: 7.6 10*3/uL (ref 4.0–10.5)
nRBC: 0 % (ref 0.0–0.2)

## 2021-12-06 LAB — GLUCOSE, CAPILLARY
Glucose-Capillary: 103 mg/dL — ABNORMAL HIGH (ref 70–99)
Glucose-Capillary: 129 mg/dL — ABNORMAL HIGH (ref 70–99)
Glucose-Capillary: 131 mg/dL — ABNORMAL HIGH (ref 70–99)
Glucose-Capillary: 151 mg/dL — ABNORMAL HIGH (ref 70–99)
Glucose-Capillary: 77 mg/dL (ref 70–99)
Glucose-Capillary: 84 mg/dL (ref 70–99)

## 2021-12-06 LAB — HEMOGLOBIN A1C
Hgb A1c MFr Bld: 7 % — ABNORMAL HIGH (ref 4.8–5.6)
Mean Plasma Glucose: 154.2 mg/dL

## 2021-12-06 MED ORDER — METOPROLOL TARTRATE 5 MG/5ML IV SOLN: 5.0000 mg | INTRAVENOUS | Status: DC | PRN

## 2021-12-06 MED ORDER — ENOXAPARIN SODIUM 40 MG/0.4ML IJ SOSY
40.0000 mg | PREFILLED_SYRINGE | INTRAMUSCULAR | Status: DC
  Administered 2021-12-06: 40 mg via SUBCUTANEOUS
  Filled 2021-12-06: qty 0.4

## 2021-12-06 MED ORDER — MUPIROCIN 2 % EX OINT
1.0000 "application " | TOPICAL_OINTMENT | Freq: Two times a day (BID) | CUTANEOUS | Status: DC
  Administered 2021-12-06 – 2021-12-08 (×5): 1 via NASAL
  Filled 2021-12-06: qty 22

## 2021-12-06 MED ORDER — ORAL CARE MOUTH RINSE
15.0000 mL | Freq: Two times a day (BID) | OROMUCOSAL | Status: DC
  Administered 2021-12-06 – 2021-12-07 (×4): 15 mL via OROMUCOSAL

## 2021-12-06 MED ORDER — LABETALOL HCL 5 MG/ML IV SOLN: 5.0000 mg | INTRAVENOUS | Status: DC | PRN

## 2021-12-06 MED ORDER — IOHEXOL 300 MG/ML  SOLN: 100.0000 mL | Freq: Once | INTRAMUSCULAR | Status: DC | PRN

## 2021-12-06 MED ORDER — ACETAMINOPHEN 325 MG PO TABS: 650.0000 mg | ORAL_TABLET | ORAL | Status: DC | PRN

## 2021-12-06 MED ORDER — ROSUVASTATIN CALCIUM 20 MG PO TABS
20.0000 mg | ORAL_TABLET | Freq: Every day | ORAL | Status: DC
  Administered 2021-12-06: 20 mg via ORAL
  Filled 2021-12-06: qty 1

## 2021-12-06 MED ORDER — METOPROLOL TARTRATE 50 MG PO TABS
50.0000 mg | ORAL_TABLET | Freq: Two times a day (BID) | ORAL | Status: DC
  Administered 2021-12-06 – 2021-12-08 (×4): 50 mg via ORAL
  Filled 2021-12-06 (×4): qty 1

## 2021-12-06 MED ORDER — APIXABAN 5 MG PO TABS: 5.0000 mg | ORAL_TABLET | Freq: Two times a day (BID) | ORAL | Status: DC

## 2021-12-06 MED ORDER — ASPIRIN 325 MG PO TABS
325.0000 mg | ORAL_TABLET | Freq: Every day | ORAL | Status: DC
  Administered 2021-12-06 – 2021-12-07 (×2): 325 mg via ORAL
  Filled 2021-12-06 (×2): qty 1

## 2021-12-06 MED ORDER — PANTOPRAZOLE SODIUM 40 MG PO TBEC
40.0000 mg | DELAYED_RELEASE_TABLET | Freq: Every day | ORAL | Status: DC
  Administered 2021-12-06 – 2021-12-08 (×3): 40 mg via ORAL
  Filled 2021-12-06 (×3): qty 1

## 2021-12-06 MED ORDER — ROSUVASTATIN CALCIUM 20 MG PO TABS
20.0000 mg | ORAL_TABLET | Freq: Every day | ORAL | Status: DC
  Administered 2021-12-07 – 2021-12-08 (×2): 20 mg via ORAL
  Filled 2021-12-06 (×2): qty 1

## 2021-12-06 MED ORDER — ACETAMINOPHEN 160 MG/5ML PO SOLN: 650.0000 mg | ORAL | Status: DC | PRN

## 2021-12-06 MED ORDER — ONDANSETRON HCL 4 MG/2ML IJ SOLN: 4.0000 mg | Freq: Four times a day (QID) | INTRAMUSCULAR | Status: DC | PRN

## 2021-12-06 MED ORDER — ROSUVASTATIN CALCIUM 20 MG PO TABS: 40.0000 mg | ORAL_TABLET | Freq: Every day | ORAL | Status: DC

## 2021-12-06 MED ORDER — CHLORHEXIDINE GLUCONATE CLOTH 2 % EX PADS
6.0000 | MEDICATED_PAD | Freq: Every day | CUTANEOUS | Status: DC
  Administered 2021-12-08: 6 via TOPICAL

## 2021-12-06 MED ORDER — CLEVIDIPINE BUTYRATE 0.5 MG/ML IV EMUL: 0.0000 mg/h | INTRAVENOUS | Status: DC

## 2021-12-06 MED ORDER — LOSARTAN POTASSIUM 50 MG PO TABS
100.0000 mg | ORAL_TABLET | Freq: Every day | ORAL | Status: DC
  Administered 2021-12-06 – 2021-12-08 (×3): 100 mg via ORAL
  Filled 2021-12-06 (×3): qty 2

## 2021-12-06 MED ORDER — LORAZEPAM 2 MG/ML IJ SOLN: 1.0000 mg | Freq: Once | INTRAMUSCULAR | Status: DC | PRN

## 2021-12-06 MED ORDER — AMLODIPINE BESYLATE 5 MG PO TABS
5.0000 mg | ORAL_TABLET | Freq: Every day | ORAL | Status: DC
  Administered 2021-12-06 – 2021-12-08 (×3): 5 mg via ORAL
  Filled 2021-12-06 (×3): qty 1

## 2021-12-06 MED ORDER — ACETAMINOPHEN 650 MG RE SUPP: 650.0000 mg | RECTAL | Status: DC | PRN

## 2021-12-06 MED ORDER — HYDRALAZINE HCL 20 MG/ML IJ SOLN: 10.0000 mg | INTRAMUSCULAR | Status: DC | PRN
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
## 2021-12-06 NOTE — Progress Notes (Addendum)
Referring Physician(s): Code stroke 12/05/21  Supervising Physician: Pedro Earls  Patient Status:  [facility] - In-pt  Chief Complaint: Follow up left M1/MCA mechanical thrombectomy 12/05/21 with Dr Tennis Must Sindy Messing  Subjective:  Patient seen in ICU, extubated, able to follow simple commands and answer simple yes/no questions, does not know where he is or remember events of yesterday.   Allergies: Patient has no known allergies.  Medications: Prior to Admission medications   Medication Sig Start Date End Date Taking? Authorizing Provider  amLODipine (NORVASC) 5 MG tablet Take 1 tablet by mouth daily. 12/08/20  Yes [provider]  aspirin 81 MG EC tablet Take 1 tablet by mouth daily. 09/08/15  Yes [provider]  dabigatran (PRADAXA) 150 MG CAPS capsule Take 150 mg by mouth 2 (two) times daily. 03/31/20  Yes [provider]  gabapentin (NEURONTIN) 300 MG capsule Take 300 mg by mouth 2 (two) times daily.   Yes [provider]  glimepiride (AMARYL) 4 MG tablet Take 4 mg by mouth daily with breakfast. 03/31/20  Yes [provider]  guaifenesin (HUMIBID E) 400 MG TABS tablet Take 1 tablet by mouth 2 (two) times daily. 12/08/20  Yes [provider]  insulin glargine (LANTUS) 100 UNIT/ML injection Inject 15 Units into the skin daily as needed (For Diabetes). 03/31/20  Yes [provider]  loratadine (CLARITIN) 10 MG tablet Take 1 tablet by mouth daily. 10/05/20  Yes [provider]  losartan (COZAAR) 100 MG tablet Take 100 mg by mouth daily. 03/31/20  Yes [provider]  metFORMIN (GLUCOPHAGE) 500 MG tablet Take 500 mg by mouth 2 (two) times daily with a meal.   Yes [provider]  metoprolol tartrate (LOPRESSOR) 100 MG tablet Take 100 mg by mouth 2 (two) times daily. 03/31/20  Yes [provider]  rosuvastatin (CRESTOR) 10 MG tablet Take 10 mg by mouth daily.   Yes [provider]   primidone (MYSOLINE) 50 MG tablet Take 1 tablet (50 mg total) by mouth in the morning and at bedtime. Patient not taking: Reported on 12/05/2021 12/29/20   Penni Bombard, MD     Vital Signs: BP 124/73    Pulse 96    Temp 98.3 F (36.8 C) (Oral)    Resp 18    Wt 209 lb 7 oz (95 kg)    SpO2 96%    BMI 27.63 kg/m   Physical Exam Vitals and nursing note reviewed.  Constitutional:      General: He is not in acute distress. HENT:     Head: Normocephalic.  Cardiovascular:     Rate and Rhythm: Normal rate.     Comments: (+) Right CFA puncture site clean, dry, dressed appropriately. Soft, non tender, non pulsatile. No erythema, edema, bleeding or drainage.  Pulmonary:     Effort: Pulmonary effort is normal.  Abdominal:     Palpations: Abdomen is soft.  Skin:    General: Skin is warm and dry.  Alert, awake - unable to state where he is right now, when told he is in the hospital appears confused. When asked if he remembers what happened/why he is here states no. Speech quiet, mild slurring with some words. Follows simple commands appropriately and answers simple questions. PER EOMs without obvious nystagmus or subjective diplopia. Visual fields grossly in tact No obvious facial asymmetry.   Imaging: IR CT Head Ltd  Result Date: 12/06/2021 INDICATION: 71 year old male with past medical history significant  for atrial fibrillation on Pradaxa, type 2 diabetes mellitus, hyperlipidemia, essential hypertension, myocardial infarct and prior CVA presenting with right-sided weakness, facial droop and aphasia. His last known well was 2:30 p.m. on 12/05/2021. NIHSS at presentation 19. No evidence of a lytic administered due to Pradaxa use. 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 CT angiogram of the head and neck showed an occlusion of the left M1/MCA with poor collaterals. He was then taken to our  service for a diagnostic cerebral angiogram and mechanical thrombectomy. EXAM: DIAGNOSTIC CEREBRAL ANGIOGRAM MECHANICAL THROMBECTOMY COMPARISON:  CT angiogram of the head and neck December 05, 2021. MEDICATIONS: Refer to anesthesia documentation. ANESTHESIA/SEDATION: The procedure was performed under general anesthesia. CONTRAST:  60 mL of Omnipaque 300 milligram/mL FLUOROSCOPY: Radiation Exposure Index (as provided by the fluoroscopic device): 404 mGy*cm2 DAP COMPLICATIONS: None immediate. TECHNIQUE: Informed written consent was obtained from the patient's son after a thorough discussion of the procedural risks, benefits and alternatives. All questions were addressed. Maximal Sterile Barrier Technique was utilized including caps, mask, sterile gowns, sterile gloves, sterile drape, hand hygiene and skin antiseptic. A timeout was performed prior to the initiation of the procedure. The right groin was prepped and draped in the usual sterile fashion. Using a micropuncture kit and the modified Seldinger technique, fluoroscopy guided access was gained to the right common femoral artery and an 8 French sheath was placed. Under fluoroscopy, a Zoom 88 guide catheter was navigated over a 6 [place] Berenstein 2 catheter and a 0.035" Terumo Glidewire into the aortic arch. The catheter was placed into the left common carotid artery and then advanced into the left internal carotid artery. The diagnostic catheter was removed. Frontal and lateral angiograms of the head were obtained. FINDINGS: 1. 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 2. Mild atherosclerotic changes of the right cavernous ICA. PROCEDURE: Using biplane roadmap, a zoom 71 aspiration catheter was navigated over an Aristotle 24 microguidewire into the cavernous segment of the left ICA. The aspiration catheter was then advanced to the level of occlusion and connected to an aspiration pump. Continuous aspiration was performed for 2 minutes. The guide  catheter was connected to a VacLok syringe. The aspiration catheter was subsequently removed under constant aspiration. The guide catheter was aspirated for debris. Left internal carotid artery angiograms with frontal and lateral views of the head showed complete recanalization of the left MCA vascular tree without evidence of thromboembolic complication. The guide catheter was retracted into the left common carotid artery under continuous contrast injection. Mild atherosclerotic changes in the left carotid bulb are noted without hemodynamically significant stenosis. Flat panel CT of the head was obtained and post processed in a separate workstation with concurrent attending physician supervision. Selected images were sent to PACS. No evidence of hemorrhagic complication. Right common femoral artery angiogram was obtained in right anterior oblique view. The puncture is at the level of the common femoral artery. The artery has normal caliber, adequate for closure device. The sheath was exchanged over the wire for a Perclose prostyle which was utilized for access closure. Immediate hemostasis was achieved. IMPRESSION: 1. Successful mechanical thrombectomy with direct contact aspiration for treatment of an occlusion of the mid-distal left M1/MCA. Complete recanalization achieved after 1 pass (TICI3). 2. No evidence of thromboembolic or hemorrhagic complication. PLAN: Patient will be transferred to ICU for continued care. Electronically Signed   By: Sandre Kitty.D.  On: 12/06/2021 09:11   IR PERCUTANEOUS ART THROMBECTOMY/INFUSION INTRACRANIAL INC DIAG ANGIO  Result Date: 12/06/2021 INDICATION: 71 year old male with past medical history significant for atrial fibrillation on Pradaxa, type 2 diabetes mellitus, hyperlipidemia, essential hypertension, myocardial infarct and prior CVA presenting with right-sided weakness, facial droop and aphasia. His last known well was 2:30 p.m. on 12/05/2021. NIHSS  at presentation 19. No evidence of a lytic administered due to Pradaxa use. 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 CT angiogram of the head and neck showed an occlusion of the left M1/MCA with poor collaterals. He was then taken to our service for a diagnostic cerebral angiogram and mechanical thrombectomy. EXAM: DIAGNOSTIC CEREBRAL ANGIOGRAM MECHANICAL THROMBECTOMY COMPARISON:  CT angiogram of the head and neck December 05, 2021. MEDICATIONS: Refer to anesthesia documentation. ANESTHESIA/SEDATION: The procedure was performed under general anesthesia. CONTRAST:  60 mL of Omnipaque 300 milligram/mL FLUOROSCOPY: Radiation Exposure Index (as provided by the fluoroscopic device): 404 mGy*cm2 DAP COMPLICATIONS: None immediate. TECHNIQUE: Informed written consent was obtained from the patient's son after a thorough discussion of the procedural risks, benefits and alternatives. All questions were addressed. Maximal Sterile Barrier Technique was utilized including caps, mask, sterile gowns, sterile gloves, sterile drape, hand hygiene and skin antiseptic. A timeout was performed prior to the initiation of the procedure. The right groin was prepped and draped in the usual sterile fashion. Using a micropuncture kit and the modified Seldinger technique, fluoroscopy guided access was gained to the right common femoral artery and an 8 French sheath was placed. Under fluoroscopy, a Zoom 88 guide catheter was navigated over a 6 [place] Berenstein 2 catheter and a 0.035" Terumo Glidewire into the aortic arch. The catheter was placed into the left common carotid artery and then advanced into the left internal carotid artery. The diagnostic catheter was removed. Frontal and lateral angiograms of the head were obtained. FINDINGS: 1. 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 2. Mild atherosclerotic changes of the right  cavernous ICA. PROCEDURE: Using biplane roadmap, a zoom 71 aspiration catheter was navigated over an Aristotle 24 microguidewire into the cavernous segment of the left ICA. The aspiration catheter was then advanced to the level of occlusion and connected to an aspiration pump. Continuous aspiration was performed for 2 minutes. The guide catheter was connected to a VacLok syringe. The aspiration catheter was subsequently removed under constant aspiration. The guide catheter was aspirated for debris. Left internal carotid artery angiograms with frontal and lateral views of the head showed complete recanalization of the left MCA vascular tree without evidence of thromboembolic complication. The guide catheter was retracted into the left common carotid artery under continuous contrast injection. Mild atherosclerotic changes in the left carotid bulb are noted without hemodynamically significant stenosis. Flat panel CT of the head was obtained and post processed in a separate workstation with concurrent attending physician supervision. Selected images were sent to PACS. No evidence of hemorrhagic complication. Right common femoral artery angiogram was obtained in right anterior oblique view. The puncture is at the level of the common femoral artery. The artery has normal caliber, adequate for closure device. The sheath was exchanged over the wire for a Perclose prostyle which was utilized for access closure. Immediate hemostasis was achieved. IMPRESSION: 1. Successful mechanical thrombectomy with direct contact aspiration for treatment of an occlusion of the mid-distal left M1/MCA. Complete recanalization achieved after 1 pass (TICI3). 2. No evidence of thromboembolic  or hemorrhagic complication. PLAN: Patient will be transferred to ICU for continued care. Electronically Signed   By: Pedro Earls M.D.   On: 12/06/2021 09:11   CT HEAD CODE STROKE WO CONTRAST  Result Date: 12/05/2021 CLINICAL DATA:  Code  stroke.  Neuro deficit, acute, stroke suspected EXAM: CT HEAD WITHOUT CONTRAST TECHNIQUE: Contiguous axial images were obtained from the base of the skull through the vertex without intravenous contrast. RADIATION DOSE REDUCTION: This exam was performed according to the departmental dose-optimization program which includes automated exposure control, adjustment of the mA and/or kV according to patient size and/or use of iterative reconstruction technique. COMPARISON:  10/07/2020. FINDINGS: Brain: No evidence of acute large vascular territory infarction, hemorrhage, hydrocephalus, extra-axial collection or mass lesion/mass effect. Remote infarcts in bilateral frontal lobes. Additional patchy white matter hypoattenuation, nonspecific but compatible with chronic microvascular ischemic disease. Atrophy with ex vacuo ventricular dilation. Vascular: No hyperdense vessel identified. Calcific intracranial sclerosis. Skull: No acute fracture Sinuses/Orbits: Air-fluid level in the right maxillary sinus with ethmoid air cell mucosal thickening. Other: No mastoid effusions. ASPECTS [facility] Stroke Program Early CT Score) total score (0-10 with 10 being normal): 10. IMPRESSION: 1. No evidence of acute large vascular territory infarct or acute hemorrhage. ASPECTS is 10. 2. Multiple remote infarcts in bilateral frontal lobes. 3. Microvascular ischemic disease and cerebral atrophy (ICD10-G31.9). Code stroke imaging results were communicated on 12/05/2021 at 6:17 pm to provider Palikh via secure text paging. Electronically Signed   By: Margaretha Sheffield M.D.   On: 12/05/2021 18:19   CT ANGIO HEAD NECK W WO CM (CODE STROKE)  Result Date: 12/05/2021 CLINICAL DATA:  Neuro deficit, acute, stroke suspected EXAM: CT ANGIOGRAPHY HEAD AND NECK TECHNIQUE: Multidetector CT imaging of the head and neck was performed using the standard protocol during bolus administration of intravenous contrast. Multiplanar CT image reconstructions and MIPs  were obtained to evaluate the vascular anatomy. Carotid stenosis measurements (when applicable) are obtained utilizing NASCET criteria, using the distal internal carotid diameter as the denominator. RADIATION DOSE REDUCTION: This exam was performed according to the departmental dose-optimization program which includes automated exposure control, adjustment of the mA and/or kV according to patient size and/or use of iterative reconstruction technique. CONTRAST:  61m OMNIPAQUE IOHEXOL 350 MG/ML SOLN COMPARISON:  Same day CT head. FINDINGS: CTA NECK FINDINGS Aortic arch: Great vessel origins are patent.  Atherosclerosis. Right carotid system: Atherosclerosis at the carotid bifurcation without greater than 50% stenosis. Left carotid system: Mixed calcific and noncalcific atherosclerosis at the carotid bifurcation with approximately 50% stenosis Vertebral arteries: Mildly left dominant. No visible significant (greater than 50%) stenosis. Skeleton: No evidence of acute abnormality. 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 Multilevel degenerative change of the cervical spine. Other neck: No evidence of acute abnormality on limited assessment. Upper chest: Visualized lung apices are clear. Review of the MIP images confirms the above findings CTA HEAD FINDINGS Anterior circulation: Bilateral intracranial ICAs are patent with mild atherosclerotic narrowing. Right M1 and proximal right M2 MCAs are patent. Occlusion of the mid left M1 MCA with asymmetrically diminished opacification or left M2 MCAs. Bilateral ACAs are patent. Right A1 ACA small, likely congenital. 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 Posterior circulation: Bilateral intradural vertebral arteries, basilar artery and posterior cerebral arteries are patent. Severe stenosis of the mid right P2 PCA. No aneurysm identified. Venous sinuses: As permitted by contrast timing, patent.  Review of the MIP images confirms the  above findings IMPRESSION: CTA head: 1. Mid left M1 MCA occlusion with asymmetrically diminished opacification of left M2 MCAs. 2. Severe stenosis of the mid right P2 PCA. 3. 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 CTA neck Bilateral carotid bifurcation atherosclerosis with approximately 50% stenosis of the left proximal ICA. Emergent preliminary findings discussed with Dr. Reeves Forth via telephone at 6:19 p.m. Electronically Signed   By: Margaretha Sheffield M.D.   On: 12/05/2021 18:42    Labs:  CBC: Recent Labs    12/05/21 1759 12/05/21 1806 12/06/21 0438  WBC 5.8  --  7.6  HGB 16.0 16.0 16.7  HCT 47.7 47.0 47.8  PLT 177  --  161    COAGS: Recent Labs    12/05/21 1759  INR 1.1  APTT 30    BMP: Recent Labs    12/05/21 1759 12/05/21 1806 12/06/21 0438  NA 141 142 138  K 4.2 4.2 4.5  CL 108 107 105  CO2 25  --  20*  GLUCOSE 126* 119* 120*  BUN 23 28* 23  CALCIUM 9.4  --  9.3  CREATININE 1.24 1.20 0.79  GFRNONAA >60  --  >60    LIVER FUNCTION TESTS: Recent Labs    12/05/21 1759 12/06/21 0438  BILITOT 1.0 1.1  AST 27 28  ALT 27 29  ALKPHOS 81 85  PROT 6.6 7.4  ALBUMIN 3.9 4.2    Assessment and Plan:  71 y/o M who presented to [facility] ED on 2/6 as a code stroke found to have left MCA occlusion for which he underwent successful distal left M1/MCA thrombectomy with Dr Tennis Must Sindy Messing seen today for post procedure follow up.  Patient extubated, alert, able to follow simple commands/answer simple questions, does not know where he is or events leading up to hospitalization. Moving all 4 extremities equally this morning. Right CFA puncture site soft, non tender, no active bleeding on exam today.  Plan: - Routine wound care for right CFA puncture site until healed, do not submerge x 7 days - Discharge planning per neurology service - Follow up with Dr Tennis Must Sindy Messing in  approximately 3 months (order placed for this to be scheduled closer to discharge)  NIR remains available as needed, please call with questions or concerns.  Electronically Signed: Joaquim Nam, PA-C 12/06/2021, 10:03 AM   I spent a total of 15 Minutes at the the patient's bedside AND on the patient's hospital floor or unit, greater than 50% of which was counseling/coordinating care for follow up left MCA thrombectomy.

## 2021-12-06 NOTE — Progress Notes (Addendum)
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  Other Stroke Risk Factors Advanced Age >/= 65  76 stroke/TIA- 2010 Coronary artery disease- 2 stents placed  Other Active Problems Essential Tremor Saw Dr. [facility]Leta [facility] 50[facility]61[facility] Diabetic nerve pain  Gabapentin 300mg 38mHospital day # 1  Patient seen and examined by NP/APP with MD. MD to update note as needed.   Devon Janine Ores FNP-BC [facility] Pager: [phone] NOTE: I reviewed above note and agree with the assessment and plan. Pt was seen and examined.   70 yea39old male with history of A-fib on Pradaxa, diabetes, hypertension, hyperlipidemia, essential tremor on primidone, CAD/MI status post stent x2, stroke in 2010 admitted for confusion, aphasia, right-sided weakness and right facial droop.  CT no acute abnormality, but old bilateral frontal infarcts.  CT head and neck left M1 occlusion, right P2 stenosis, bilateral ICA 50% stenosis.  Had a thrombectomy with TICI3.  MRI showed left temporal moderate-sized infarct with scattered punctate left MCA infarct.  MRA showed patent left M1 post IR.  EF 60 to 65%.   LDL 99, A1c 7.0, creatinine 1.2.  On exam, patient lying in bed, awake alert, eyes open, however still has global aphasia, not able to name and repeat.  However able to pantomime.  Still has mild right facial droop, however tracking bilaterally, no gaze palsy, although blinking to visual threat on the left but inconsistently blinking to visual threat on the right.  Moving all extremities symmetrically.  Sensation in the coronation not cooperative.  Gait not tested.  Etiology of region stroke likely due to A-fib even on Pradaxa.  Given moderate-sized stroke, we will put on aspirin 325 for now, consider switch to Eliquis in 2 days given Pradaxa failure.  Still has A-fib on telemetry, resume metoprolol home meds for rate control.  Continue home amlodipine and losartan for BP control.  Taper off Cleviprex as able, will relax BP goal to less than 180/105 after 24 hours IR.  Increase Crestor to 20.  Patient passed swallow, on diet.  PT/OT recommend outpatient PT.  Patient will follow-up with Dr. PenumaLeta BaptistA.  [facility] detailed assessment and plan, please refer to above as I have made changes wherever appropriate.   JindonRosalin HawkinghD Stroke Neurology 12/06/2021 7:52 PM  This patient is critically ill due to left MCA stroke, left M1 occlusion status post thrombectomy, A-fib and at significant risk of neurological worsening, death form recurrent stroke, hemorrhagic conversion, heart failure, seizure. This patient's care requires constant monitoring of vital signs, hemodynamics, respiratory and cardiac monitoring, review of multiple databases, neurological assessment, discussion with family, other specialists and medical decision making of high complexity. I spent 35 minutes of neurocritical care time in the care of this patient.    To contact Stroke Continuity provider, please refer to [email or web address], contact General Neurology
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
## 2021-12-06 NOTE — Progress Notes (Addendum)
[place] for apixaban Indication: atrial fibrillation   Labs: Recent Labs    12/05/21 1759 12/05/21 1806 12/06/21 0438  HGB 16.0 16.0 16.7  HCT 47.7 47.0 47.8  PLT 177  --  161  APTT 30  --   --   LABPROT 14.1  --   --   INR 1.1  --   --   CREATININE 1.24 1.20 0.79    Assessment: 43 yom presenting 2/6 with acute stroke due to afib, on dabigatran PTA (last dose reportedly 2/6 AM). Pharmacy consulted to transition to apixaban. CBC WNL. SCr down to 0.79 today. No bleed issues reported. Currently on SCDs only inpatient.  Goal of Therapy:  Stroke prevention Monitor platelets by anticoagulation protocol: Yes   Plan:  Apixaban 5mg  PO BID Monitor CBC, SCr, sx/bleeding  ADDENDUM - per discussion with Dr. Erlinda Hong, decision now made to postpone resuming apixaban for now. Will d/c orders.  Arturo Morton, PharmD, BCPS Clinical Pharmacist 12/06/2021 5:02 PM

## 2021-12-06 NOTE — TOC CAGE-AID Note (Signed)
Transition of Care [facility]) - CAGE-AID Screening   Patient Details  Name: Cory Brown MRN: [id number] Date of Birth: 03-Apr-1951  Transition of Care [facility]) CM/SW Contact:    Najee Cowens C Tarpley-Carter, LCSWA Phone Number: 12/06/2021, 10:04 AM   Clinical Narrative: Pt participated in Cage-Aid.  Pt stated he does not use substance or ETOH.  Pt was not offered resources, due to no usage of substance or ETOH.     Jacere Pangborn Tarpley-Carter, MSW, LCSW-A Pronouns:  She/Her/Hers [place] Transitions of Care Clinical Social Worker Direct Number:  [phone] [email or web address]   CAGE-AID Screening:    Have You Ever Felt You Ought to Cut Down on Your Drinking or Drug Use?: No Have People Annoyed You By [employer] Your Drinking Or Drug Use?: No Have You Felt Bad Or Guilty About Your Drinking Or Drug Use?: No Have You Ever Had a Drink or Used Drugs First Thing In The Morning to Steady Your Nerves or to Get Rid of a Hangover?: No CAGE-AID Score: 0  Substance Abuse Education Offered: No
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
## 2021-12-06 NOTE — Plan of Care (Signed)
?  Problem: Education: ?Goal: Knowledge of disease or condition will improve ?Outcome: Progressing ?Goal: Knowledge of secondary prevention will improve (SELECT ALL) ?Outcome: Progressing ?Goal: Knowledge of patient specific risk factors will improve (INDIVIDUALIZE FOR PATIENT) ?Outcome: Progressing ?  ?

## 2021-12-07 DIAGNOSIS — I480 Paroxysmal atrial fibrillation: Secondary | ICD-10-CM

## 2021-12-07 DIAGNOSIS — I10 Essential (primary) hypertension: Secondary | ICD-10-CM

## 2021-12-07 LAB — CBC
HCT: 42.9 % (ref 39.0–52.0)
Hemoglobin: 15 g/dL (ref 13.0–17.0)
MCH: 33.9 pg (ref 26.0–34.0)
MCHC: 35 g/dL (ref 30.0–36.0)
MCV: 96.8 fL (ref 80.0–100.0)
Platelets: 152 10*3/uL (ref 150–400)
RBC: 4.43 MIL/uL (ref 4.22–5.81)
RDW: 13.1 % (ref 11.5–15.5)
WBC: 6.3 10*3/uL (ref 4.0–10.5)
nRBC: 0 % (ref 0.0–0.2)

## 2021-12-07 LAB — BASIC METABOLIC PANEL
Anion gap: 11 (ref 5–15)
BUN: 21 mg/dL (ref 8–23)
CO2: 21 mmol/L — ABNORMAL LOW (ref 22–32)
Calcium: 8.7 mg/dL — ABNORMAL LOW (ref 8.9–10.3)
Chloride: 104 mmol/L (ref 98–111)
Creatinine, Ser: 0.9 mg/dL (ref 0.61–1.24)
GFR, Estimated: >60 mL/min (ref >60)
Glucose, Bld: 124 mg/dL — ABNORMAL HIGH (ref 70–99)
Potassium: 3.6 mmol/L (ref 3.5–5.1)
Sodium: 136 mmol/L (ref 135–145)

## 2021-12-07 LAB — GLUCOSE, CAPILLARY
Glucose-Capillary: 113 mg/dL — ABNORMAL HIGH (ref 70–99)
Glucose-Capillary: 100 mg/dL — ABNORMAL HIGH (ref 70–99)
Glucose-Capillary: 155 mg/dL — ABNORMAL HIGH (ref 70–99)
Glucose-Capillary: 199 mg/dL — ABNORMAL HIGH (ref 70–99)
Glucose-Capillary: 127 mg/dL — ABNORMAL HIGH (ref 70–99)

## 2021-12-07 LAB — MAGNESIUM: Magnesium: 2 mg/dL (ref 1.7–2.4)

## 2021-12-07 LAB — PHOSPHORUS: Phosphorus: 3.6 mg/dL (ref 2.5–4.6)

## 2021-12-07 MED ORDER — POTASSIUM CHLORIDE CRYS ER 20 MEQ PO TBCR
40.0000 meq | EXTENDED_RELEASE_TABLET | Freq: Once | ORAL | Status: AC
Start: 2021-12-07 — End: 2021-12-07
  Administered 2021-12-07: 40 meq via ORAL
  Filled 2021-12-07: qty 2

## 2021-12-07 MED ORDER — DABIGATRAN ETEXILATE MESYLATE 150 MG PO CAPS
150.0000 mg | ORAL_CAPSULE | Freq: Two times a day (BID) | ORAL | Status: DC
  Administered 2021-12-07 – 2021-12-08 (×2): 150 mg via ORAL
  Filled 2021-12-07 (×3): qty 1

## 2021-12-07 MED ORDER — INSULIN ASPART 100 UNIT/ML IJ SOLN
0.0000 [IU] | Freq: Three times a day (TID) | INTRAMUSCULAR | Status: DC
  Administered 2021-12-08: 3 [IU] via SUBCUTANEOUS
  Administered 2021-12-08: 2 [IU] via SUBCUTANEOUS
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
## 2021-12-07 NOTE — Anesthesia Postprocedure Evaluation (Signed)
Anesthesia Post Note  Patient: Cory Brown  Procedure(s) Performed: IR WITH ANESTHESIA (canceled)     Patient location during evaluation: PACU Anesthesia Type: General Level of consciousness: awake and alert Pain management: pain level controlled Vital Signs Assessment: post-procedure vital signs reviewed and stable Respiratory status: spontaneous breathing, nonlabored ventilation, respiratory function stable and patient connected to nasal cannula oxygen Cardiovascular status: blood pressure returned to baseline and stable Postop Assessment: no apparent nausea or vomiting Anesthetic complications: no   No notable events documented.  Last Vitals:  Vitals:   12/07/21 0700 12/07/21 0714  BP: 135/86   Pulse: 95   Resp: (!) 21   Temp:  36.6 C  SpO2: 92%     Last Pain:  Vitals:   12/07/21 0714  TempSrc: Oral  PainSc:                  Kdyn Vonbehren S
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
## 2021-12-07 NOTE — Progress Notes (Signed)
ANTICOAGULATION CONSULT NOTE - Initial Consult  Pharmacy Consult for Pradaxa Indication: atrial fibrillation  No Known Allergies  Patient Measurements: Weight: 95 kg (209 lb 7 oz)  Vital Signs: Temp: 98 F (36.7 C) (02/08 1207) Temp Source: Oral (02/08 1207) BP: 160/87 (02/08 1045) Pulse Rate: 94 (02/08 1045)  Labs: Recent Labs    12/05/21 1759 12/05/21 1806 12/06/21 0438 12/07/21 0054  HGB 16.0 16.0 16.7 15.0  HCT 47.7 47.0 47.8 42.9  PLT 177  --  161 152  APTT 30  --   --   --   LABPROT 14.1  --   --   --   INR 1.1  --   --   --   CREATININE 1.24 1.20 0.79 0.90    CrCl cannot be calculated (Unknown ideal weight.).   Medical History: Past Medical History:  Diagnosis Date   Arthritis    Atrial fibrillation (HCC)    Diabetes mellitus without complication (HCC)    Hypercholesterolemia    Hypertension    Kidney stones    MI (myocardial infarction) (HCC)    Stroke (HCC) 2010   Tremor      Assessment: 71 yo male admitted on 12/05/21 with M1 occlusion. Patient reportedly on pradaxa prior to admission. Per note in Care Everywhere on 11/21/21 patient last filled pradaxa on 07/15/21 for 90 day supply. Discussed with patient who reported that he does normally take this medication but ran out and was no longer taking it. He reported last taking at ~end of October because he received 30 days not 90 days in September. Discussed with Dr. Roda Shutters and will resume pradaxa since likely not a pradaxa failure due to nonadherence.   Goal of Therapy:  Monitor platelets by anticoagulation protocol: Yes   Plan:  Start pradaxa 150mg  twice daily with first dose tonight per neurology Re-enforced importance of adherence with patient   , PharmD, BCPS Clinical Pharmacist 12/07/2021 12:53 PM

## 2021-12-07 NOTE — Anesthesia Preprocedure Evaluation (Signed)
Anesthesia Evaluation  Patient identified by MRN, date of birth, ID band Patient unresponsive  Preop documentation limited or incomplete due to emergent nature of procedure.  Airway Mallampati: Unable to assess  TM Distance: >3 FB     Dental   Pulmonary    breath sounds clear to auscultation       Cardiovascular hypertension, + Past MI  + dysrhythmias Atrial Fibrillation  Rhythm:regular Rate:Normal     Neuro/Psych Code stroke CVA    GI/Hepatic   Endo/Other  diabetes  Renal/GU      Musculoskeletal   Abdominal   Peds  Hematology   Anesthesia Other Findings   Reproductive/Obstetrics                             Anesthesia Physical Anesthesia Plan  ASA: 3 and emergent  Anesthesia Plan: General   Post-op Pain Management:    Induction: Intravenous  PONV Risk Score and Plan: 2 and Ondansetron, Dexamethasone and Treatment may vary due to age or medical condition  Airway Management Planned: Oral ETT  Additional Equipment:   Intra-op Plan:   Post-operative Plan: Extubation in OR  Informed Consent:     Dental advisory given  Plan Discussed with: CRNA, Anesthesiologist and Surgeon  Anesthesia Plan Comments:         Anesthesia Quick Evaluation

## 2021-12-07 NOTE — Discharge Instructions (Addendum)
Information on my medicine - Pradaxa (dabigatran)  Why was Pradaxa prescribed for you? Pradaxa was prescribed for you to reduce the risk of forming blood clots that cause a stroke if you have a medical condition called atrial fibrillation (a type of irregular heartbeat).    What do you Need to know about PradAXa? Take your Pradaxa TWICE DAILY - one capsule in the morning and one tablet in the evening with or without food.  It would be best to take the doses about the same time each day.  The capsules should not be broken, chewed or opened - they must be swallowed whole.  Do not store Pradaxa in other medication containers - once the bottle is opened the Pradaxa should be used within FOUR months; throw away any capsules that havent been by that time.  Take Pradaxa exactly as prescribed by your doctor.  DO NOT stop taking Pradaxa without talking to the doctor who prescribed the medication.  Stopping without other stroke prevention medication to take the place of Pradaxa may increase your risk of developing a clot that causes a stroke.  Refill your prescription before you run out.  After discharge, you should have regular check-up appointments with your healthcare provider that is prescribing your Pradaxa.  In the future your dose may need to be changed if your kidney function or weight changes by a significant amount.  What do you do if you miss a dose? If you miss a dose, take it as soon as you remember on the same day.  If your next dose is less than 6 hours away, skip the missed dose.  Do not take two doses of PRADAXA at the same time.  Important Safety Information A possible side effect of Pradaxa is bleeding. You should call your healthcare provider right away if you experience any of the following: Bleeding from an injury or your nose that does not stop. Unusual colored urine (red or dark brown) or unusual colored stools (red or black). Unusual bruising for unknown reasons. A  serious fall or if you hit your head (even if there is no bleeding).  Some medicines may interact with Pradaxa and might increase your risk of bleeding or clotting while on Pradaxa. To help avoid this, consult your healthcare provider or pharmacist prior to using any new prescription or non-prescription medications, including herbals, vitamins, non-steroidal anti-inflammatory drugs (NSAIDs) and supplements.  This website has more information on Pradaxa (dabigatran): https://www.pradaxa.com   Mr. Tae, you were admitted with a left sided MCA stroke.  You underwent thrombectomy which restored blood flow to this artery.  Your symptoms have improved since then, and you will be discharged with outpatient OT follow up.  It is very important that you take your Pradaxa twice daily as prescribed to prevent a stroke in the future.

## 2021-12-08 ENCOUNTER — Other Ambulatory Visit (HOSPITAL_COMMUNITY): Payer: Self-pay

## 2021-12-08 ENCOUNTER — Encounter (HOSPITAL_COMMUNITY): Payer: Self-pay | Admitting: Radiology

## 2021-12-08 DIAGNOSIS — G250 Essential tremor: Secondary | ICD-10-CM

## 2021-12-08 LAB — BASIC METABOLIC PANEL
Anion gap: 11 (ref 5–15)
BUN: 13 mg/dL (ref 8–23)
CO2: 24 mmol/L (ref 22–32)
Calcium: 9 mg/dL (ref 8.9–10.3)
Chloride: 102 mmol/L (ref 98–111)
Creatinine, Ser: 0.95 mg/dL (ref 0.61–1.24)
GFR, Estimated: >60 mL/min (ref >60)
Glucose, Bld: 130 mg/dL — ABNORMAL HIGH (ref 70–99)
Potassium: 3.8 mmol/L (ref 3.5–5.1)
Sodium: 137 mmol/L (ref 135–145)

## 2021-12-08 LAB — CBC
HCT: 47.5 % (ref 39.0–52.0)
Hemoglobin: 15.9 g/dL (ref 13.0–17.0)
MCH: 32.2 pg (ref 26.0–34.0)
MCHC: 33.5 g/dL (ref 30.0–36.0)
MCV: 96.2 fL (ref 80.0–100.0)
Platelets: 168 10*3/uL (ref 150–400)
RBC: 4.94 MIL/uL (ref 4.22–5.81)
RDW: 12.9 % (ref 11.5–15.5)
WBC: 6.1 10*3/uL (ref 4.0–10.5)
nRBC: 0 % (ref 0.0–0.2)

## 2021-12-08 LAB — GLUCOSE, CAPILLARY
Glucose-Capillary: 135 mg/dL — ABNORMAL HIGH (ref 70–99)
Glucose-Capillary: 185 mg/dL — ABNORMAL HIGH (ref 70–99)
Glucose-Capillary: 255 mg/dL — ABNORMAL HIGH (ref 70–99)

## 2021-12-08 MED ORDER — METOPROLOL TARTRATE 50 MG PO TABS: 50.0000 mg | ORAL_TABLET | Freq: Two times a day (BID) | ORAL | 1 refills | Status: DC

## 2021-12-08 MED ORDER — ROSUVASTATIN CALCIUM 20 MG PO TABS: 20.0000 mg | ORAL_TABLET | Freq: Every day | ORAL | 1 refills | Status: DC

## 2021-12-08 MED ORDER — DABIGATRAN ETEXILATE MESYLATE 150 MG PO CAPS
150.0000 mg | ORAL_CAPSULE | Freq: Two times a day (BID) | ORAL | 1 refills | Status: DC
  Filled 2021-12-08: qty 60, 30d supply, fill #0

## 2021-12-08 MED ORDER — DABIGATRAN ETEXILATE MESYLATE 150 MG PO CAPS: 150.0000 mg | ORAL_CAPSULE | Freq: Two times a day (BID) | ORAL | 1 refills | Status: DC

## 2021-12-08 NOTE — TOC Transition Note (Addendum)
Transition of Care [facility]) - CM/SW Discharge Note   Patient Details  Name: BARTLEY AASE MRN: [id number] Date of Birth: 09/22/1951  [facility] CM/SW Contact:  Tom-Johnson, Renea Ee, RN Phone Number: 12/08/2021, 12:53 PM   Clinical Narrative:     Patient is scheduled for discharge today. Admitted for Stroke. Patient states he is from home alone. Gave permission to CM to speak with son, Corene Cornea as he states he cannot remember some things. CM called and spoke with Corene Cornea [phone] he states patient was independent prior to admission and drives self. States patient is still employed at [employer] in [place] at [employer]. Patient is active at the [facility] in [place] and PCP is Lyman Speller, MD. Corene Cornea requesting patient use the Outpatient OT at the [facility]. CM called and spoke with April ( [phone] ex [phone]) and she gave patient's SW information to contact. Cm called Eddie North [phone] ex [phone]) and left a secured message. CM also called and spoke with Dan Europe at [phone]) and she states she will send referral to patient's PCP and they will order Outpatient OT.  No other needs identified. Corene Cornea to transport at discharge. No further TOC needs noted.  ![phone]- Received a call from Eddie North requesting Outpatient order, OT note and d/c summary faxed to [id number] Lyman Speller, MD. Documents faxed with successful notice received. .  Final next level of care: OP Rehab (Patient's PCP at the [facility] to arrange OT.) Barriers to Discharge: Continued Medical Work up   Patient Goals and CMS Choice Patient states their goals for this hospitalization and ongoing recovery are:: To return home CMS Medicare.gov Compare Post Acute Care list provided to:: Patient Choice offered to / list presented to : Patient, Adult Children Clinical cytogeneticist)  Discharge Placement                       Discharge Plan and Services   Discharge  Planning Services: CM Consult            DME Arranged: N/A         HH Arranged: NA HH Agency: NA        Social Determinants of Health (SDOH) Interventions     Readmission Risk Interventions No flowsheet data found.
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
## 2021-12-08 NOTE — Discharge Summary (Addendum)
Stroke Discharge Summary  Patient ID: Cory Brown   MRN: [id number]      DOB: Nov 17, 1950  Date of Admission: 12/05/2021 Date of Discharge: 12/08/2021  Attending Physician:  Stroke, Md, MD, Stroke MD Consultant(s):    pulmonary/intensive care  Patient's PCP:  [facility]  DISCHARGE DIAGNOSIS:  Principal Problem:   Left MCA stroke s/p thrombectomy  Active Problems:   Afib not compliant with Pradaxa Hypertension Hyperlipidemia Diabetes CAD status post stenting Essential tremor   Allergies as of 12/08/2021   No Known Allergies      Medication List     STOP taking these medications    aspirin 81 MG EC tablet   primidone 50 MG tablet Commonly known as: MYSOLINE       TAKE these medications    amLODipine 5 MG tablet Commonly known as: NORVASC Take 1 tablet by mouth daily.   dabigatran 150 MG Caps capsule Commonly known as: PRADAXA Take 1 capsule (150 mg total) by mouth every 12 (twelve) hours. What changed: when to take this   gabapentin 300 MG capsule Commonly known as: NEURONTIN Take 300 mg by mouth 2 (two) times daily.   glimepiride 4 MG tablet Commonly known as: AMARYL Take 4 mg by mouth daily with breakfast.   guaifenesin 400 MG Tabs tablet Commonly known as: HUMIBID E Take 1 tablet by mouth 2 (two) times daily.   insulin glargine 100 UNIT/ML injection Commonly known as: LANTUS Inject 15 Units into the skin daily as needed (For Diabetes).   loratadine 10 MG tablet Commonly known as: CLARITIN Take 1 tablet by mouth daily.   losartan 100 MG tablet Commonly known as: COZAAR Take 100 mg by mouth daily.   metFORMIN 500 MG tablet Commonly known as: GLUCOPHAGE Take 500 mg by mouth 2 (two) times daily with a meal.   metoprolol tartrate 50 MG tablet Commonly known as: LOPRESSOR Take 1 tablet (50 mg total) by mouth 2 (two) times daily. What changed:  medication strength how much to take   rosuvastatin 20 MG tablet Commonly known as:  CRESTOR Take 1 tablet (20 mg total) by mouth daily. Start taking on: December 09, 2021 What changed:  medication strength how much to take        LABORATORY STUDIES CBC    Component Value Date/Time   WBC 6.1 12/08/2021 0323   RBC 4.94 12/08/2021 0323   HGB 15.9 12/08/2021 0323   HCT 47.5 12/08/2021 0323   PLT 168 12/08/2021 0323   MCV 96.2 12/08/2021 0323   MCH 32.2 12/08/2021 0323   MCHC 33.5 12/08/2021 0323   RDW 12.9 12/08/2021 0323   LYMPHSABS 1.8 12/05/2021 1759   MONOABS 0.6 12/05/2021 1759   EOSABS 0.2 12/05/2021 1759   BASOSABS 0.0 12/05/2021 1759   CMP    Component Value Date/Time   NA 137 12/08/2021 0323   K 3.8 12/08/2021 0323   CL 102 12/08/2021 0323   CO2 24 12/08/2021 0323   GLUCOSE 130 (H) 12/08/2021 0323   BUN 13 12/08/2021 0323   CREATININE 0.95 12/08/2021 0323   CALCIUM 9.0 12/08/2021 0323   PROT 7.4 12/06/2021 0438   ALBUMIN 4.2 12/06/2021 0438   AST 28 12/06/2021 0438   ALT 29 12/06/2021 0438   ALKPHOS 85 12/06/2021 0438   BILITOT 1.1 12/06/2021 0438   GFRNONAA >60 12/08/2021 0323   GFRAA >90 01/07/2014 1900   COAGS Lab Results  Component Value Date   INR 1.1  12/05/2021   INR 1.2 03/22/2008   INR 1.1 03/21/2008   Lipid Panel    Component Value Date/Time   CHOL 151 12/06/2021 0438   TRIG 41 12/06/2021 0438   HDL 44 12/06/2021 0438   CHOLHDL 3.4 12/06/2021 0438   VLDL 8 12/06/2021 0438   LDLCALC 99 12/06/2021 0438   HgbA1C  Lab Results  Component Value Date   HGBA1C 7.0 (H) 12/06/2021   Urinalysis    Component Value Date/Time   COLORURINE YELLOW 01/07/2014 1905   APPEARANCEUR CLEAR 01/07/2014 1905   LABSPEC 1.025 01/07/2014 1905   PHURINE 5.0 01/07/2014 1905   GLUCOSEU 250 (A) 01/07/2014 1905   HGBUR NEGATIVE 01/07/2014 1905   BILIRUBINUR NEGATIVE 01/07/2014 1905   KETONESUR NEGATIVE 01/07/2014 1905   PROTEINUR NEGATIVE 01/07/2014 1905   UROBILINOGEN 0.2 01/07/2014 1905   NITRITE NEGATIVE 01/07/2014 1905    LEUKOCYTESUR NEGATIVE 01/07/2014 1905   Urine Drug Screen No results found for: LABOPIA, COCAINSCRNUR, LABBENZ, AMPHETMU, THCU, LABBARB  Alcohol Level    Component Value Date/Time   ETH <10 12/05/2021 1754     SIGNIFICANT DIAGNOSTIC STUDIES MR ANGIO HEAD WO CONTRAST  Result Date: 12/06/2021 CLINICAL DATA:  Neuro deficit, acute, stroke suspected; Stroke, follow up EXAM: MRI HEAD WITHOUT CONTRAST MRA HEAD WITHOUT CONTRAST TECHNIQUE: Multiplanar, multi-echo pulse sequences of the brain and surrounding structures were acquired without intravenous contrast. Angiographic images of the Circle of Willis were acquired using MRA technique without intravenous contrast. COMPARISON:  CT/CTA from December 05, 2021. FINDINGS: MRI HEAD FINDINGS Brain: Confluent acute infarct involving the anterior left temporal lobe. Additional scattered punctate acute infarcts throughout the left frontal and parietal lobes. Associated edema without significant mass effect. Multiple sites of encephalomalacia in bilateral frontal lobes, compatible with remote infarcts. Some mild areas susceptibility artifact associated with prior bifrontal infarcts, compatible with prior hemorrhage. No evidence of acute hemorrhage. Additional patchy scattered T2/FLAIR hyperintensity white matter, nonspecific but valve chronic microvascular disease. Vascular: See below. Skull and upper cervical spine: Normal marrow signal. Sinuses/Orbits: Mild paranasal sinus mucosal thickening. No acute orbital findings. Other: No mastoid effusions. MRA HEAD FINDINGS Anterior circulation: Bilateral intracranial ICAs, MCAs, and ACAs are patent without proximal hemodynamically significant stenosis. 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 Posterior circulation: Bilateral visualized intradural vertebral arteries, basilar artery, and posterior cerebral arteries are patent. Redemonstrated severe right P2  PCA stenosis. No aneurysm identified. IMPRESSION: MRI: 1. Confluent acute infarct in the anterior left temporal lobe. Additional scattered punctate acute infarcts throughout the left frontal and parietal lobes. 2. Remote bifrontal infarcts and chronic microvascular ischemic disease. MRA: 1. Left MCA is now patent status post mechanical thrombectomy. 2. Similar severe right P2 PCA stenosis. 3. 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 Electronically Signed   By: Margaretha Sheffield M.D.   On: 12/06/2021 16:12   MR BRAIN WO CONTRAST  Result Date: 12/06/2021 CLINICAL DATA:  Neuro deficit, acute, stroke suspected; Stroke, follow up EXAM: MRI HEAD WITHOUT CONTRAST MRA HEAD WITHOUT CONTRAST TECHNIQUE: Multiplanar, multi-echo pulse sequences of the brain and surrounding structures were acquired without intravenous contrast. Angiographic images of the Circle of Willis were acquired using MRA technique without intravenous contrast. COMPARISON:  CT/CTA from December 05, 2021. FINDINGS: MRI HEAD FINDINGS Brain: Confluent acute infarct involving the anterior left temporal lobe. Additional scattered punctate acute infarcts throughout the left frontal and parietal lobes. Associated edema without significant mass effect. Multiple sites  of encephalomalacia in bilateral frontal lobes, compatible with remote infarcts. Some mild areas susceptibility artifact associated with prior bifrontal infarcts, compatible with prior hemorrhage. No evidence of acute hemorrhage. Additional patchy scattered T2/FLAIR hyperintensity white matter, nonspecific but valve chronic microvascular disease. Vascular: See below. Skull and upper cervical spine: Normal marrow signal. Sinuses/Orbits: Mild paranasal sinus mucosal thickening. No acute orbital findings. Other: No mastoid effusions. MRA HEAD FINDINGS Anterior circulation: Bilateral intracranial ICAs, MCAs, and ACAs are patent without  proximal hemodynamically significant stenosis. 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 Posterior circulation: Bilateral visualized intradural vertebral arteries, basilar artery, and posterior cerebral arteries are patent. Redemonstrated severe right P2 PCA stenosis. No aneurysm identified. IMPRESSION: MRI: 1. Confluent acute infarct in the anterior left temporal lobe. Additional scattered punctate acute infarcts throughout the left frontal and parietal lobes. 2. Remote bifrontal infarcts and chronic microvascular ischemic disease. MRA: 1. Left MCA is now patent status post mechanical thrombectomy. 2. Similar severe right P2 PCA stenosis. 3. 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 Electronically Signed   By: Margaretha Sheffield M.D.   On: 12/06/2021 16:12   IR CT Head Ltd  Result Date: 12/06/2021 INDICATION: 71 year old male with past medical history significant for atrial fibrillation on Pradaxa, type 2 diabetes mellitus, hyperlipidemia, essential hypertension, myocardial infarct and prior CVA presenting with right-sided weakness, facial droop and aphasia. His last known well was 2:30 p.m. on 12/05/2021. NIHSS at presentation 19. No evidence of a lytic administered due to Pradaxa use. 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 CT angiogram of the head and neck showed an occlusion of the left M1/MCA with poor collaterals. He was then taken to our service for a diagnostic cerebral angiogram and mechanical thrombectomy. EXAM: DIAGNOSTIC CEREBRAL ANGIOGRAM MECHANICAL THROMBECTOMY COMPARISON:  CT angiogram of the head and neck December 05, 2021. MEDICATIONS: Refer to anesthesia documentation. ANESTHESIA/SEDATION: The procedure was performed under general anesthesia. CONTRAST:  60 mL  of Omnipaque 300 milligram/mL FLUOROSCOPY: Radiation Exposure Index (as provided by the fluoroscopic device): 404 mGy*cm2 DAP COMPLICATIONS: None immediate. TECHNIQUE: Informed written consent was obtained from the patient's son after a thorough discussion of the procedural risks, benefits and alternatives. All questions were addressed. Maximal Sterile Barrier Technique was utilized including caps, mask, sterile gowns, sterile gloves, sterile drape, hand hygiene and skin antiseptic. A timeout was performed prior to the initiation of the procedure. The right groin was prepped and draped in the usual sterile fashion. Using a micropuncture kit and the modified Seldinger technique, fluoroscopy guided access was gained to the right common femoral artery and an 8 French sheath was placed. Under fluoroscopy, a Zoom 88 guide catheter was navigated over a 6 [place] Berenstein 2 catheter and a 0.035" Terumo Glidewire into the aortic arch. The catheter was placed into the left common carotid artery and then advanced into the left internal carotid artery. The diagnostic catheter was removed. Frontal and lateral angiograms of the head were obtained. FINDINGS: 1. 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 2. Mild atherosclerotic changes of the right cavernous ICA. PROCEDURE: Using biplane roadmap, a zoom 71 aspiration catheter was navigated over an Aristotle 24 microguidewire into the cavernous segment of the left ICA. The aspiration catheter was then advanced to the level of occlusion and connected to an aspiration pump. Continuous aspiration was performed for 2 minutes.  The guide catheter was connected to a VacLok syringe. The aspiration catheter was subsequently removed under constant aspiration. The guide catheter was aspirated for debris. Left internal carotid artery angiograms with frontal and lateral views of the head showed complete recanalization of the left MCA vascular tree without evidence of  thromboembolic complication. The guide catheter was retracted into the left common carotid artery under continuous contrast injection. Mild atherosclerotic changes in the left carotid bulb are noted without hemodynamically significant stenosis. Flat panel CT of the head was obtained and post processed in a separate workstation with concurrent attending physician supervision. Selected images were sent to PACS. No evidence of hemorrhagic complication. Right common femoral artery angiogram was obtained in right anterior oblique view. The puncture is at the level of the common femoral artery. The artery has normal caliber, adequate for closure device. The sheath was exchanged over the wire for a Perclose prostyle which was utilized for access closure. Immediate hemostasis was achieved. IMPRESSION: 1. Successful mechanical thrombectomy with direct contact aspiration for treatment of an occlusion of the mid-distal left M1/MCA. Complete recanalization achieved after 1 pass (TICI3). 2. No evidence of thromboembolic or hemorrhagic complication. PLAN: Patient will be transferred to ICU for continued care. Electronically Signed   By: Pedro Earls M.D.   On: 12/06/2021 09:11   ECHOCARDIOGRAM COMPLETE  Result Date: 12/06/2021    ECHOCARDIOGRAM REPORT   Patient Name:   Cory Brown Date of Exam: 12/06/2021 Medical Rec #:  [id number]     Height:       73.0 in Accession #:    [id number]    Weight:       209.4 lb Date of Birth:  April 16, 1951     BSA:          2.194 m Patient Age:    24 years      BP:           120/73 mmHg Patient Gender: M             HR:           90 bpm. Exam Location:  Inpatient Procedure: 2D Echo, Cardiac Doppler and Color Doppler Indications:    CVA  History:        Patient has no prior history of Echocardiogram examinations.                 Previous Myocardial Infarction, Stroke, Arrythmias:Atrial                 Fibrillation; Risk Factors:Diabetes, Dyslipidemia and                  Hypertension.  Sonographer:    Richardson Referring Phys: [id number] [place]  1. Left ventricular ejection fraction, by estimation, is 60 to 65%. The left ventricle has normal function. The left ventricle has no regional wall motion abnormalities. Left ventricular diastolic function could not be evaluated.  2. Right ventricular systolic function is normal. The right ventricular size is mildly enlarged. There is mildly elevated pulmonary artery systolic pressure. The estimated right ventricular systolic pressure is 44.0 mmHg.  3. Left atrial size was severely dilated.  4. Right atrial size was severely dilated.  5. The mitral valve is normal in structure. No evidence of mitral valve regurgitation. No evidence of mitral stenosis.  6. The aortic valve is tricuspid. There is mild calcification of the aortic valve. There is mild thickening of the aortic valve. Aortic valve regurgitation  is not visualized. Aortic valve sclerosis is present, with no evidence of aortic valve stenosis.  7. The inferior vena cava is normal in size with greater than 50% respiratory variability, suggesting right atrial pressure of 3 mmHg. FINDINGS  Left Ventricle: Left ventricular ejection fraction, by estimation, is 60 to 65%. The left ventricle has normal function. The left ventricle has no regional wall motion abnormalities. The left ventricular internal cavity size was normal in size. There is  borderline concentric left ventricular hypertrophy. Left ventricular diastolic function could not be evaluated due to atrial fibrillation. Left ventricular diastolic function could not be evaluated. Right Ventricle: The right ventricular size is mildly enlarged. No increase in right ventricular wall thickness. Right ventricular systolic function is normal. There is mildly elevated pulmonary artery systolic pressure. The tricuspid regurgitant velocity is 2.99 m/s, and with an assumed right atrial pressure of 3 mmHg, the  estimated right ventricular systolic pressure is 12.7 mmHg. Left Atrium: Left atrial size was severely dilated. Right Atrium: Right atrial size was severely dilated. Pericardium: There is no evidence of pericardial effusion. Mitral Valve: The mitral valve is normal in structure. No evidence of mitral valve regurgitation. No evidence of mitral valve stenosis. Tricuspid Valve: The tricuspid valve is normal in structure. Tricuspid valve regurgitation is mild . No evidence of tricuspid stenosis. Aortic Valve: The aortic valve is tricuspid. There is mild calcification of the aortic valve. There is mild thickening of the aortic valve. Aortic valve regurgitation is not visualized. Aortic valve sclerosis is present, with no evidence of aortic valve stenosis. Aortic valve mean gradient measures 5.0 mmHg. Aortic valve peak gradient measures 9.5 mmHg. Aortic valve area, by VTI measures 3.59 cm. Pulmonic Valve: The pulmonic valve was normal in structure. Pulmonic valve regurgitation is trivial. No evidence of pulmonic stenosis. Aorta: The aortic root is normal in size and structure. Venous: The inferior vena cava is normal in size with greater than 50% respiratory variability, suggesting right atrial pressure of 3 mmHg. IAS/Shunts: No atrial level shunt detected by color flow Doppler.  LEFT VENTRICLE PLAX 2D LVIDd:         4.90 cm LVIDs:         3.60 cm LV PW:         1.10 cm LV IVS:        1.20 cm LVOT diam:     2.50 cm LV SV:         97 LV SV Index:   44 LVOT Area:     4.91 cm  LV Volumes (MOD) LV vol d, MOD A2C: 126.0 ml LV vol d, MOD A4C: 70.6 ml LV vol s, MOD A2C: 37.3 ml LV vol s, MOD A4C: 27.9 ml LV SV MOD A2C:     88.7 ml LV SV MOD A4C:     70.6 ml LV SV MOD BP:      63.4 ml RIGHT VENTRICLE RV Basal diam:  4.90 cm RV Mid diam:    3.70 cm RV S prime:     6.42 cm/s TAPSE (M-mode): 1.0 cm LEFT ATRIUM              Index        RIGHT ATRIUM           Index LA diam:        5.40 cm  2.46 cm/m   RA Area:     26.40 cm LA Vol  (A2C):   96.5 ml  43.98 ml/m  RA Volume:  91.80 ml  41.84 ml/m LA Vol (A4C):   102.0 ml 46.49 ml/m LA Biplane Vol: 99.9 ml  45.53 ml/m  AORTIC VALVE                     PULMONIC VALVE AV Area (Vmax):    3.67 cm      PV Vmax:          1.27 m/s AV Area (Vmean):   3.59 cm      PV Vmean:         90.500 cm/s AV Area (VTI):     3.59 cm      PV VTI:           0.231 m AV Vmax:           154.00 cm/s   PV Peak grad:     6.5 mmHg AV Vmean:          105.000 cm/s  PV Mean grad:     4.0 mmHg AV VTI:            0.270 m       PR End Diast Vel: 2.88 msec AV Peak Grad:      9.5 mmHg AV Mean Grad:      5.0 mmHg LVOT Vmax:         115.00 cm/s LVOT Vmean:        76.833 cm/s LVOT VTI:          0.197 m LVOT/AV VTI ratio: 0.73  AORTA Ao Root diam: 3.60 cm Ao Asc diam:  3.50 cm MV E velocity: 127.67 cm/s  TRICUSPID VALVE                             TR Peak grad:   35.8 mmHg                             TR Vmax:        299.00 cm/s                              SHUNTS                             Systemic VTI:  0.20 m                             Systemic Diam: 2.50 cm Dani Gobble Croitoru MD Electronically signed by Sanda Klein MD Signature Date/Time: 12/06/2021/3:11:40 PM    Final    IR PERCUTANEOUS ART THROMBECTOMY/INFUSION INTRACRANIAL INC DIAG ANGIO  Result Date: 12/06/2021 INDICATION: 71 year old male with past medical history significant for atrial fibrillation on Pradaxa, type 2 diabetes mellitus, hyperlipidemia, essential hypertension, myocardial infarct and prior CVA presenting with right-sided weakness, facial droop and aphasia. His last known well was 2:30 p.m. on 12/05/2021. NIHSS at presentation 19. No evidence of a lytic administered due to Pradaxa use. 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 CT angiogram of the head and neck showed an occlusion of the left M1/MCA with poor collaterals. He was then taken to our service for  a diagnostic cerebral angiogram and mechanical thrombectomy. EXAM: DIAGNOSTIC CEREBRAL ANGIOGRAM MECHANICAL THROMBECTOMY COMPARISON:  CT  angiogram of the head and neck December 05, 2021. MEDICATIONS: Refer to anesthesia documentation. ANESTHESIA/SEDATION: The procedure was performed under general anesthesia. CONTRAST:  60 mL of Omnipaque 300 milligram/mL FLUOROSCOPY: Radiation Exposure Index (as provided by the fluoroscopic device): 404 mGy*cm2 DAP COMPLICATIONS: None immediate. TECHNIQUE: Informed written consent was obtained from the patient's son after a thorough discussion of the procedural risks, benefits and alternatives. All questions were addressed. Maximal Sterile Barrier Technique was utilized including caps, mask, sterile gowns, sterile gloves, sterile drape, hand hygiene and skin antiseptic. A timeout was performed prior to the initiation of the procedure. The right groin was prepped and draped in the usual sterile fashion. Using a micropuncture kit and the modified Seldinger technique, fluoroscopy guided access was gained to the right common femoral artery and an 8 French sheath was placed. Under fluoroscopy, a Zoom 88 guide catheter was navigated over a 6 [place] Berenstein 2 catheter and a 0.035" Terumo Glidewire into the aortic arch. The catheter was placed into the left common carotid artery and then advanced into the left internal carotid artery. The diagnostic catheter was removed. Frontal and lateral angiograms of the head were obtained. FINDINGS: 1. 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 2. Mild atherosclerotic changes of the right cavernous ICA. PROCEDURE: Using biplane roadmap, a zoom 71 aspiration catheter was navigated over an Aristotle 24 microguidewire into the cavernous segment of the left ICA. The aspiration catheter was then advanced to the level of occlusion and connected to an aspiration pump. Continuous aspiration was performed for 2 minutes. The guide catheter was  connected to a VacLok syringe. The aspiration catheter was subsequently removed under constant aspiration. The guide catheter was aspirated for debris. Left internal carotid artery angiograms with frontal and lateral views of the head showed complete recanalization of the left MCA vascular tree without evidence of thromboembolic complication. The guide catheter was retracted into the left common carotid artery under continuous contrast injection. Mild atherosclerotic changes in the left carotid bulb are noted without hemodynamically significant stenosis. Flat panel CT of the head was obtained and post processed in a separate workstation with concurrent attending physician supervision. Selected images were sent to PACS. No evidence of hemorrhagic complication. Right common femoral artery angiogram was obtained in right anterior oblique view. The puncture is at the level of the common femoral artery. The artery has normal caliber, adequate for closure device. The sheath was exchanged over the wire for a Perclose prostyle which was utilized for access closure. Immediate hemostasis was achieved. IMPRESSION: 1. Successful mechanical thrombectomy with direct contact aspiration for treatment of an occlusion of the mid-distal left M1/MCA. Complete recanalization achieved after 1 pass (TICI3). 2. No evidence of thromboembolic or hemorrhagic complication. PLAN: Patient will be transferred to ICU for continued care. Electronically Signed   By: Pedro Earls M.D.   On: 12/06/2021 09:11   CT HEAD CODE STROKE WO CONTRAST  Result Date: 12/05/2021 CLINICAL DATA:  Code stroke.  Neuro deficit, acute, stroke suspected EXAM: CT HEAD WITHOUT CONTRAST TECHNIQUE: Contiguous axial images were obtained from the base of the skull through the vertex without intravenous contrast. RADIATION DOSE REDUCTION: This exam was performed according to the departmental dose-optimization program which includes automated exposure  control, adjustment of the mA and/or kV according to patient size and/or use of iterative reconstruction technique. COMPARISON:  10/07/2020. FINDINGS: Brain: No evidence of acute large vascular territory infarction, hemorrhage, hydrocephalus, extra-axial collection or mass lesion/mass effect. Remote infarcts in bilateral  frontal lobes. Additional patchy white matter hypoattenuation, nonspecific but compatible with chronic microvascular ischemic disease. Atrophy with ex vacuo ventricular dilation. Vascular: No hyperdense vessel identified. Calcific intracranial sclerosis. Skull: No acute fracture Sinuses/Orbits: Air-fluid level in the right maxillary sinus with ethmoid air cell mucosal thickening. Other: No mastoid effusions. ASPECTS [facility] Stroke Program Early CT Score) total score (0-10 with 10 being normal): 10. IMPRESSION: 1. No evidence of acute large vascular territory infarct or acute hemorrhage. ASPECTS is 10. 2. Multiple remote infarcts in bilateral frontal lobes. 3. Microvascular ischemic disease and cerebral atrophy (ICD10-G31.9). Code stroke imaging results were communicated on 12/05/2021 at 6:17 pm to provider [place] via secure text paging. Electronically Signed   By: Margaretha Sheffield M.D.   On: 12/05/2021 18:19   CT ANGIO HEAD NECK W WO CM (CODE STROKE)  Result Date: 12/05/2021 CLINICAL DATA:  Neuro deficit, acute, stroke suspected EXAM: CT ANGIOGRAPHY HEAD AND NECK TECHNIQUE: Multidetector CT imaging of the head and neck was performed using the standard protocol during bolus administration of intravenous contrast. Multiplanar CT image reconstructions and MIPs were obtained to evaluate the vascular anatomy. Carotid stenosis measurements (when applicable) are obtained utilizing NASCET criteria, using the distal internal carotid diameter as the denominator. RADIATION DOSE REDUCTION: This exam was performed according to the departmental dose-optimization program which includes automated exposure  control, adjustment of the mA and/or kV according to patient size and/or use of iterative reconstruction technique. CONTRAST:  62m OMNIPAQUE IOHEXOL 350 MG/ML SOLN COMPARISON:  Same day CT head. FINDINGS: CTA NECK FINDINGS Aortic arch: Great vessel origins are patent.  Atherosclerosis. Right carotid system: Atherosclerosis at the carotid bifurcation without greater than 50% stenosis. Left carotid system: Mixed calcific and noncalcific atherosclerosis at the carotid bifurcation with approximately 50% stenosis Vertebral arteries: Mildly left dominant. No visible significant (greater than 50%) stenosis. Skeleton: No evidence of acute abnormality. 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 Multilevel degenerative change of the cervical spine. Other neck: No evidence of acute abnormality on limited assessment. Upper chest: Visualized lung apices are clear. Review of the MIP images confirms the above findings CTA HEAD FINDINGS Anterior circulation: Bilateral intracranial ICAs are patent with mild atherosclerotic narrowing. Right M1 and proximal right M2 MCAs are patent. Occlusion of the mid left M1 MCA with asymmetrically diminished opacification or left M2 MCAs. Bilateral ACAs are patent. Right A1 ACA small, likely congenital. 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 Posterior circulation: Bilateral intradural vertebral arteries, basilar artery and posterior cerebral arteries are patent. Severe stenosis of the mid right P2 PCA. No aneurysm identified. Venous sinuses: As permitted by contrast timing, patent. Review of the MIP images confirms the above findings IMPRESSION: CTA head: 1. Mid left M1 MCA occlusion with asymmetrically diminished opacification of left M2 MCAs. 2. Severe stenosis of the mid right P2 PCA. 3. 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 CTA neck Bilateral carotid  bifurcation atherosclerosis with approximately 50% stenosis of the left proximal ICA. Emergent preliminary findings discussed with Dr. PReeves Forthvia telephone at 6:19 p.m. Electronically Signed   By: FMargaretha SheffieldM.D.   On: 12/05/2021 18:42      HISTORY OF PRESENT ILLNESS Patient with a history of atrial fibrillation, T2DM, HLD, HTN, MI s/p two stents and stroke was admitted with right hemiplegia, left gaze deviation and global aphasia.   HOSPITAL COURSE Patient was taken for thrombectomy of left MCA stroke shortly after arrival to  the hospital.  Complete recanalization with TICI3 flow was achieved after one pass.  Pradaxa was resumed, as patient had stopped taking this at home, and patient was instructed to continue this medication.  He was treated for his hypertension with his home metoprolol, amlodipine and losartan, and his rosuvastatin was increased to 20 mg daily due to LDL of 99.  His A1C was noted to be 7.0, and he was instructed to follow closely with his PCP for better control of his diabetes.  Patient will be discharged to home with outpatient OT follow up.  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 ASPECTS is 10. Multiple remote infarcts in bilateral frontal lobes.  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 Post IR CT Complete recanalization achieved after 1 pass (TICI3). MRI- Confluent acute infarct in the anterior left temporal lobe. Additional scattered punctate acute infarcts throughout the left frontal and parietal lobes. Remote bifrontal infarcts and chronic microvascular ischemic disease. MRA- Left MCA is now patent status post mechanical thrombectomy. Severe right P2 PCA stenosis. 2D Echo EF is 60 to 65%, Left and right atrial size severely  dilated LDL 99 HgbA1c 7.0 VTE prophylaxis - lovenox Pradaxa (dabigatran) twice a day prior to admission, now on pradaxa Therapy recommendations:  Outpatient PT Disposition:  Pending   Atrial Fibrillation  (Prev Dr. Sung Amabile at coumadin clinic in 2009), CVA reported in 2010  PTA on pradaxa but not compliant, stopped in 07/2021 by himself without refill resumed pradaxa Managed by [place] in [place]  On metoprolol for rate control   Hypertension Home meds:  Lopressor 129m BID, Norvasc 533m losartan 100  Stable off cleviprex now On home meds Long-term BP goal normotensive   Hyperlipidemia Home meds:  Crestor 1060mincreased in hospital LDL 99, goal < 70 Increase Crestor to 66m39mntinue statin on discharge   Diabetes type II Controlled Home meds:  Metformin HgbA1c 7.0, goal < 7.0 CBGs SSI Close PCP follow-up for better DM control   Other Stroke Risk Factors Advanced Age >/= 65  89 stroke/TIA- 2010 Coronary artery disease- 2 stents placed   Other Active Problems Essential Tremor [facility]15[facility] Diabetic nerve pain  Gabapentin 300mg 9m DISCHARGE EXAM Blood pressure (!) 148/87, pulse 88, temperature 98.2 F (36.8 C), temperature source Oral, resp. rate 19, height 6' 1"  (1.854 m), weight 95 kg, SpO2 94 %. General:  Alert, well nourished, well developed patient in no acute distress.   NEURO:  Mental Status: AA&Ox3  Speech/Language: speech is without dysarthria or aphasia.  Repetition, fluency, and comprehension intact.  Cranial Nerves:  II: PERRL. Visual fields full.  III, IV, VI: EOMI. Eyelids elevate symmetrically.  V: Sensation is intact to light touch and symmetrical to face.  VII: Smile is symmetrical. VIII: hearing intact to voice. IX, X:  Phonation is normal.  XII: tongue is midline without fasciculations. Motor: 5/5 strength to all muscle groups tested.  Sensation- Intact to light touch bilaterally. Extinction absent to  light touch to DSS.   Coordination: FTN intact bilaterally. No drift.  Gait- deferred   Discharge Diet       Diet   Diet Heart Room service appropriate? Yes; Fluid consistency: Thin   liquids  DISCHARGE PLAN Disposition:  to home Pradaxa (dabigatran) twice a day for secondary stroke prevention. Ongoing stroke risk factor control by Primary Care Physician at time of discharge Follow-up PCP [place] in 2 weeks. Follow-up in [facility] Dr. Leta Baptist in 4 weeks, office to schedule an appointment.   35 minutes were spent preparing discharge.  [place] , MSN, AGACNP-BC [facility] See Amion for schedule and pager information 12/08/2021 11:21 AM   ATTENDING NOTE: I reviewed above note and agree with the assessment and plan. Pt was seen and examined.   Patient seen in ICU, sitting in chair, neurologically intact, speech normal on exam.  Resumed Pradaxa last night, tolerating well.  Stable for discharge.  He will follow-up with Dr. Leta Baptist at [facility].  For detailed assessment and plan, please refer to above as I have made changes wherever appropriate.   Rosalin Hawking, MD PhD Stroke Neurology 12/08/2021 6:07 PM

## 2021-12-08 NOTE — Addendum Note (Signed)
Addendum  created 12/08/21 1124 by Fabienne Bruns, RN   Attached Procedures edited

## 2021-12-08 NOTE — Plan of Care (Signed)
  Problem: Education: Goal: Knowledge of disease or condition will improve Outcome: Progressing Goal: Knowledge of secondary prevention will improve (SELECT ALL) Outcome: Progressing   

## 2021-12-13 ENCOUNTER — Encounter (HOSPITAL_COMMUNITY): Payer: Self-pay | Admitting: Radiology

## 2021-12-13 ENCOUNTER — Other Ambulatory Visit (HOSPITAL_BASED_OUTPATIENT_CLINIC_OR_DEPARTMENT_OTHER): Payer: Self-pay

## 2022-01-16 ENCOUNTER — Other Ambulatory Visit: Payer: Self-pay

## 2022-01-16 DIAGNOSIS — J209 Acute bronchitis, unspecified: Secondary | ICD-10-CM | POA: Insufficient documentation

## 2022-01-16 DIAGNOSIS — M6281 Muscle weakness (generalized): Secondary | ICD-10-CM

## 2022-01-16 DIAGNOSIS — E7800 Pure hypercholesterolemia, unspecified: Secondary | ICD-10-CM | POA: Insufficient documentation

## 2022-01-16 DIAGNOSIS — I2510 Atherosclerotic heart disease of native coronary artery without angina pectoris: Secondary | ICD-10-CM

## 2022-01-16 DIAGNOSIS — Z719 Counseling, unspecified: Secondary | ICD-10-CM | POA: Insufficient documentation

## 2022-01-16 DIAGNOSIS — G5620 Lesion of ulnar nerve, unspecified upper limb: Secondary | ICD-10-CM

## 2022-01-16 DIAGNOSIS — I639 Cerebral infarction, unspecified: Secondary | ICD-10-CM | POA: Insufficient documentation

## 2022-01-16 DIAGNOSIS — M542 Cervicalgia: Secondary | ICD-10-CM | POA: Insufficient documentation

## 2022-01-16 DIAGNOSIS — E118 Type 2 diabetes mellitus with unspecified complications: Secondary | ICD-10-CM | POA: Insufficient documentation

## 2022-01-16 DIAGNOSIS — M545 Low back pain, unspecified: Secondary | ICD-10-CM | POA: Insufficient documentation

## 2022-01-16 DIAGNOSIS — R251 Tremor, unspecified: Secondary | ICD-10-CM | POA: Insufficient documentation

## 2022-01-16 DIAGNOSIS — I10 Essential (primary) hypertension: Secondary | ICD-10-CM | POA: Insufficient documentation

## 2022-01-16 DIAGNOSIS — E785 Hyperlipidemia, unspecified: Secondary | ICD-10-CM

## 2022-01-16 DIAGNOSIS — N200 Calculus of kidney: Secondary | ICD-10-CM | POA: Insufficient documentation

## 2022-01-16 DIAGNOSIS — M1990 Unspecified osteoarthritis, unspecified site: Secondary | ICD-10-CM | POA: Insufficient documentation

## 2022-01-16 DIAGNOSIS — E119 Type 2 diabetes mellitus without complications: Secondary | ICD-10-CM | POA: Insufficient documentation

## 2022-01-16 DIAGNOSIS — I219 Acute myocardial infarction, unspecified: Secondary | ICD-10-CM | POA: Insufficient documentation

## 2022-01-16 DIAGNOSIS — G5600 Carpal tunnel syndrome, unspecified upper limb: Secondary | ICD-10-CM

## 2022-01-16 HISTORY — DX: Lesion of ulnar nerve, unspecified upper limb: G56.20

## 2022-01-16 HISTORY — DX: Cervicalgia: M54.2

## 2022-01-16 HISTORY — DX: Essential (primary) hypertension: I10

## 2022-01-16 HISTORY — DX: Cerebral infarction, unspecified: I63.9

## 2022-01-16 HISTORY — DX: Muscle weakness (generalized): M62.81

## 2022-01-16 HISTORY — DX: Acute bronchitis, unspecified: J20.9

## 2022-01-16 HISTORY — DX: Carpal tunnel syndrome, unspecified upper limb: G56.00

## 2022-01-16 HISTORY — DX: Atherosclerotic heart disease of native coronary artery without angina pectoris: I25.10

## 2022-01-16 HISTORY — DX: Low back pain, unspecified: M54.50

## 2022-01-16 HISTORY — DX: Hyperlipidemia, unspecified: E78.5

## 2022-01-16 HISTORY — DX: Counseling, unspecified: Z71.9

## 2022-02-22 ENCOUNTER — Encounter: Payer: Self-pay | Admitting: Cardiology

## 2022-02-22 ENCOUNTER — Ambulatory Visit (INDEPENDENT_AMBULATORY_CARE_PROVIDER_SITE_OTHER): Payer: Medicare PPO | Admitting: Cardiology

## 2022-02-22 VITALS — BP 146/82 | HR 80 | Ht 73.0 in | Wt 213.8 lb

## 2022-02-22 DIAGNOSIS — E119 Type 2 diabetes mellitus without complications: Secondary | ICD-10-CM

## 2022-02-22 DIAGNOSIS — E7800 Pure hypercholesterolemia, unspecified: Secondary | ICD-10-CM | POA: Diagnosis not present

## 2022-02-22 DIAGNOSIS — I2510 Atherosclerotic heart disease of native coronary artery without angina pectoris: Secondary | ICD-10-CM | POA: Diagnosis not present

## 2022-02-22 DIAGNOSIS — I482 Chronic atrial fibrillation, unspecified: Secondary | ICD-10-CM | POA: Diagnosis not present

## 2022-02-22 DIAGNOSIS — I639 Cerebral infarction, unspecified: Secondary | ICD-10-CM | POA: Diagnosis not present

## 2022-02-22 DIAGNOSIS — E782 Mixed hyperlipidemia: Secondary | ICD-10-CM

## 2022-02-22 DIAGNOSIS — E785 Hyperlipidemia, unspecified: Secondary | ICD-10-CM

## 2022-02-22 DIAGNOSIS — I10 Essential (primary) hypertension: Secondary | ICD-10-CM

## 2022-02-22 DIAGNOSIS — Z8673 Personal history of transient ischemic attack (TIA), and cerebral infarction without residual deficits: Secondary | ICD-10-CM

## 2022-02-22 MED ORDER — METOPROLOL TARTRATE 50 MG PO TABS: 50.0000 mg | ORAL_TABLET | Freq: Two times a day (BID) | ORAL | 3 refills | Status: DC

## 2022-02-22 NOTE — Patient Instructions (Signed)
Medication Instructions:  Your physician recommends that you continue on your current medications as directed. Please refer to the Current Medication list given to you today.  *If you need a refill on your cardiac medications before your next appointment, please call your pharmacy*   Lab Work: None ordered If you have labs (blood work) drawn today and your tests are completely normal, you will receive your results only by: MyChart Message (if you have MyChart) OR A paper copy in the mail If you have any lab test that is abnormal or we need to change your treatment, we will call you to review the results.   Testing/Procedures: Your physician has requested that you have a carotid duplex. This test is an ultrasound of the carotid arteries in your neck. It looks at blood flow through these arteries that supply the brain with blood. Allow one hour for this exam. There are no restrictions or special instructions.    Follow-Up: At [facility], you and your health needs are our priority.  As part of our continuing mission to provide you with exceptional heart care, we have created designated Provider Care Teams.  These Care Teams include your primary Cardiologist (physician) and Advanced Practice Providers (APPs -  Physician Assistants and Nurse Practitioners) who all work together to provide you with the care you need, when you need it.  We recommend signing up for the patient portal called "MyChart".  Sign up information is provided on this After Visit Summary.  MyChart is used to connect with patients for Virtual Visits (Telemedicine).  Patients are able to view lab/test results, encounter notes, upcoming appointments, etc.  Non-urgent messages can be sent to your provider as well.   To learn more about what you can do with MyChart, go to [email or web address].    Your next appointment:   6 month(s)  The format for your next appointment:   In Person  Provider:   Rajan Revankar,  MD   Other Instructions NA  

## 2022-02-22 NOTE — Progress Notes (Signed)
?Cardiology Office Note:   ? ?Date:  02/22/2022  ? ?ID:  Cory Brown, DOB 1951/02/24, MRN [id number] ? ?PCP:  [facility]  ?Cardiologist:  Garwin Brothers, MD  ? ?Referring MD: Trenton Founds, MD  ? ? ?ASSESSMENT:   ? ?1. Acute ischemic stroke (HCC)   ?2. Chronic atrial fibrillation (HCC)   ?3. Coronary artery disease involving native heart, unspecified vessel or lesion type, unspecified whether angina present   ?4. Hypercholesterolemia   ?5. Hyperlipidemia, unspecified hyperlipidemia type   ?6. Mixed hyperlipidemia   ?7. Essential (primary) hypertension   ?8. Coronary artery disease involving native coronary artery of native heart without angina pectoris   ?9. Diabetes mellitus without complication (HCC)   ?10. History of recent stroke   ? ?PLAN:   ? ?In order of problems listed above: ? ?Coronary artery disease: Secondary prevention stressed with the patient.  Importance of compliance with diet medication stressed any vocalized understanding. ?Permanent atrial fibrillation:I discussed with the patient atrial fibrillation, disease process. Management and therapy including rate and rhythm control, anticoagulation benefits and potential risks were discussed extensively with the patient. Patient had multiple questions which were answered to patient's satisfaction.  Compliance with medications understanding promises to do so. ?Essential hypertension: Blood pressure stable and diet was emphasized.  Lifestyle modification urged. ?Mixed dyslipidemia: On statin therapy and lipids followed by primary care. ?History of stroke: Followed by neurology.  Stable at this time. ?Patient and family had multiple questions which were answered to their satisfaction.Patient will be seen in follow-up appointment in 6 months or earlier if the patient has any concerns.  Records were reviewed. ? ? ? ?Medication Adjustments/Labs and Tests Ordered: ?Current medicines are reviewed at length with the patient today.  Concerns regarding  medicines are outlined above.  ?Orders Placed This Encounter  ?Procedures  ? EKG 12-Lead  ? VAS US CAROTID  ? ?Meds ordered this encounter  ?Medications  ? metoprolol tartrate (LOPRESSOR) 50 MG tablet  ?  Sig: Take 1 tablet (50 mg total) by mouth 2 (two) times daily.  ?  Dispense:  180 tablet  ?  Refill:  3  ? ? ? ?History of Present Illness:   ? ?Cory Brown is a 71 y.o. male who is being seen today for the evaluation of atrial fibrillation with recent stroke at the request of Trenton Founds, MD. patient is a pleasant 71 year old male.  He is accompanied by family member.  He mentions to me that he has history of atrial fibrillation.  He recently had a stroke.  He was not on anticoagulation at that time.  He stopped his medications by himself.  There is history mentioned of coronary artery disease though details are not clear to me.  There is history of essential hypertension dyslipidemia and diabetes mellitus.  He seems to have recovered well from the stroke.  He has come with his family member to the clinic.  At the time of my evaluation, the patient is alert awake oriented and in no distress. ? ?Past Medical History:  ?Diagnosis Date  ? Acute bronchitis, unspecified 01/16/2022  ? Acute ischemic stroke (HCC) 12/05/2021  ? Arthritis   ? Atrial fibrillation (HCC)   ? Carpal tunnel syndrome 01/16/2022  ? Cerebral infarction, unspecified (HCC) 01/16/2022  ? Cerebrovascular accident [facility]) 01/16/2022  ? Dec 27, 2021 Entered By: [facility] D Comment: Left MCA stroke s/p thrombectomy, 12/05/2021.  ? Chronic atrial fibrillation (HCC) 09/17/2021  ? Coronary artery disease 01/16/2022  ?  Dec 27, 2021 Entered By: [facility] D Comment: S/p stent Obtuse marginal 2, 10/2013.  ? Counseling, unspecified 01/16/2022  ? Diabetes mellitus without complication (HCC)   ? Essential (primary) hypertension 01/16/2022  ? Generalized weakness 09/17/2021  ? Hypercholesterolemia   ? Hyperglycemia due to diabetes mellitus (HCC) 09/17/2021  ?  Hyperlipidemia 01/16/2022  ? Hypertension   ? Hyponatremia 09/17/2021  ? Kidney stones   ? Low back pain 01/16/2022  ? MI (myocardial infarction) (HCC)   ? Muscle weakness (generalized) 01/16/2022  ? Neck pain 01/16/2022  ? Non-traumatic rhabdomyolysis 09/19/2021  ? Special screening for malignant neoplasms, colon 11/30/2009  ? May 05, 2010 Entered By: [facility] D Comment: Normal per pt  ? Stroke (cerebrum) (HCC) 12/05/2021  ? Stroke [facility]) 2010  ? Thrombocytopenia (HCC) 09/18/2021  ? Traumatic rhabdomyolysis (HCC) 09/17/2021  ? Tremor   ? 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 ? ?Past Surgical History:  ?Procedure Laterality Date  ? cardiac stents  2005  ? CHOLECYSTECTOMY  2007  ? IR CT HEAD LTD  12/05/2021  ? IR PERCUTANEOUS ART THROMBECTOMY/INFUSION INTRACRANIAL INC DIAG ANGIO  12/05/2021  ? KIDNEY STONE SURGERY  1997-2003  ? RADIOLOGY WITH ANESTHESIA N/A 12/05/2021  ? Procedure: RADIOLOGY WITH ANESTHESIA;  Surgeon: Radiologist, Medication, MD;  Location: [facility] OR;  Service: Radiology;  Laterality: N/A;  ? ? ?Current Medications: ?Current Meds  ?Medication Sig  ? amLODipine (NORVASC) 5 MG tablet Take 1 tablet by mouth daily.  ? dabigatran (PRADAXA) 150 MG CAPS capsule Take 1 capsule (150 mg total) by mouth every 12 (twelve) hours.  ? empagliflozin (JARDIANCE) 25 MG TABS tablet Take 12.5 mg by mouth daily.  ? gabapentin (NEURONTIN) 300 MG capsule Take 300 mg by mouth 2 (two) times daily.  ? glimepiride (AMARYL) 4 MG tablet Take 4 mg by mouth daily with breakfast.  ? insulin glargine (LANTUS) 100 UNIT/ML injection Inject 15 Units into the skin daily as needed (For Diabetes).  ? losartan (COZAAR) 100 MG tablet Take 100 mg by mouth daily.  ? metFORMIN (GLUCOPHAGE) 500 MG tablet Take 500 mg by mouth 2 (two) times daily with a meal.  ? rosuvastatin (CRESTOR) 10 MG tablet Take 10 mg by mouth daily.  ? [DISCONTINUED] metoprolol tartrate (LOPRESSOR) 50 MG tablet Take 1 tablet (50 mg total) by mouth 2 (two) times daily.  ?  ? ?Allergies:    Patient has no known allergies.  ? ?Social History  ? ?Socioeconomic History  ? Marital status: Single  ?  Spouse name: Not on file  ? Number of children: 0  ? Years of education: 54  ? Highest education level: Not on file  ?Occupational History  ?  Comment: [employer]  ?Tobacco Use  ? Smoking status: Never  ? Smokeless tobacco: Never  ?Substance and Sexual Activity  ? Alcohol use: Never  ? Drug use: Never  ? Sexual activity: Not on file  ?Other Topics Concern  ? Not on file  ?Social History Narrative  ? Lives alone  ? ?Social Determinants of Health  ? ?Financial Resource Strain: Not on file  ?Food Insecurity: Not on file  ?Transportation Needs: Not on file  ?Physical Activity: Not on file  ?Stress: Not on file  ?Social Connections: Not on file  ?  ? ?Family History: ?The patient's family history includes Cancer in his brother and sister; Diabetes in his brother and father; Hypertension in his mother; Stroke in his mother. ? ?ROS:   ?Please see the history of present  illness.    ?All other systems reviewed and are negative. ? ?EKGs/Labs/Other Studies Reviewed:   ? ?The following studies were reviewed today: ?EKG reveals atrial fibrillation with well-controlled ventricular rate. ? ? ?Recent Labs: ?12/06/2021: ALT 29 ?12/07/2021: Magnesium 2.0 ?12/08/2021: BUN 13; Creatinine, Ser 0.95; Hemoglobin 15.9; Platelets 168; Potassium 3.8; Sodium 137  ?Recent Lipid Panel ?   ?Component Value Date/Time  ? CHOL 151 12/06/2021 0438  ? TRIG 41 12/06/2021 0438  ? HDL 44 12/06/2021 0438  ? CHOLHDL 3.4 12/06/2021 0438  ? VLDL 8 12/06/2021 0438  ? LDLCALC 99 12/06/2021 0438  ? ? ?Physical Exam:   ? ?VS:  BP (!) 146/82   Pulse 80   Ht 6\' 1"  (1.854 m)   Wt 213 lb 12.8 oz (97 kg)   SpO2 99%   BMI 28.21 kg/m?    ? ?Wt Readings from Last 3 Encounters:  ?02/22/22 213 lb 12.8 oz (97 kg)  ?12/07/21 209 lb 7 oz (95 kg)  ?12/29/20 229 lb (103.9 kg)  ?  ? ?GEN: Patient is in no acute distress ?HEENT: Normal ?NECK: No JVD; bilateral soft  carotid bruits ?LYMPHATICS: No lymphadenopathy ?CARDIAC: S1 S2 regular, 2/6 systolic murmur at the apex. ?RESPIRATORY:  Clear to auscultation without rales, wheezing or rhonchi  ?ABDOMEN: Soft, non-tender, non-distend

## 2022-03-07 ENCOUNTER — Ambulatory Visit (INDEPENDENT_AMBULATORY_CARE_PROVIDER_SITE_OTHER): Payer: Medicare PPO

## 2022-03-07 DIAGNOSIS — I2510 Atherosclerotic heart disease of native coronary artery without angina pectoris: Secondary | ICD-10-CM | POA: Diagnosis not present

## 2022-03-07 DIAGNOSIS — E785 Hyperlipidemia, unspecified: Secondary | ICD-10-CM

## 2022-03-07 DIAGNOSIS — I6389 Other cerebral infarction: Secondary | ICD-10-CM

## 2022-03-07 DIAGNOSIS — E7800 Pure hypercholesterolemia, unspecified: Secondary | ICD-10-CM

## 2022-03-07 DIAGNOSIS — I639 Cerebral infarction, unspecified: Secondary | ICD-10-CM

## 2022-03-07 DIAGNOSIS — I482 Chronic atrial fibrillation, unspecified: Secondary | ICD-10-CM

## 2022-03-14 ENCOUNTER — Other Ambulatory Visit: Payer: Self-pay

## 2022-03-14 NOTE — Patient Outreach (Signed)
[facility] Customer service manager [facility]) Care Management ? ?03/14/2022 ? ?Cory Brown ?April 10, 1951 ?[id number] ? ? ?First telephone outreach attempt to obtain mRS. No answer. Left message for returned call. ? ?Vanice Sarah ?[facility]-Care Management Assistant ?[phone] ? ?

## 2022-03-21 ENCOUNTER — Other Ambulatory Visit: Payer: Self-pay

## 2022-03-21 NOTE — Patient Outreach (Signed)
[facility]) Care Management  03/21/2022  Cory Brown [birth year] [id number]   Second telephone outreach attempt to obtain mRS. No answer. Left message for returned call.  Vanice Sarah [facility] Management Assistant [phone]

## 2022-03-23 ENCOUNTER — Other Ambulatory Visit: Payer: Self-pay

## 2022-03-23 NOTE — Patient Outreach (Signed)
[place] [facility]) Care Management  03/23/2022  TRESTAN MASSIMINO 23-Aug-1951 ES:[id number]   3 outreach attempts were completed to obtain mRs. mRs could not be obtained because patient never returned my calls. mRs=7    [place] Management Assistant [phone]

## 2023-07-08 ENCOUNTER — Other Ambulatory Visit: Payer: Self-pay

## 2023-07-08 ENCOUNTER — Inpatient Hospital Stay (HOSPITAL_COMMUNITY): Payer: No Typology Code available for payment source

## 2023-07-08 ENCOUNTER — Inpatient Hospital Stay (HOSPITAL_COMMUNITY)
Admission: EM | Admit: 2023-07-08 | Discharge: 2023-07-11 | DRG: 023 | Disposition: A | Discharge status: Rehabilitation Facility | Payer: No Typology Code available for payment source | Attending: Neurology | Admitting: Neurology

## 2023-07-08 ENCOUNTER — Emergency Department (HOSPITAL_COMMUNITY): Payer: Non-veteran care | Admitting: Certified Registered"

## 2023-07-08 ENCOUNTER — Emergency Department (HOSPITAL_COMMUNITY): Payer: No Typology Code available for payment source | Admitting: Certified Registered"

## 2023-07-08 ENCOUNTER — Emergency Department (HOSPITAL_COMMUNITY): Payer: Non-veteran care

## 2023-07-08 ENCOUNTER — Emergency Department (HOSPITAL_COMMUNITY): Payer: No Typology Code available for payment source

## 2023-07-08 ENCOUNTER — Encounter (HOSPITAL_COMMUNITY)
Admission: EM | Disposition: A | Discharge status: Rehabilitation Facility | Payer: Self-pay | Source: Home / Self Care | Attending: Internal Medicine

## 2023-07-08 ENCOUNTER — Encounter (HOSPITAL_COMMUNITY): Payer: Self-pay | Admitting: Internal Medicine

## 2023-07-08 DIAGNOSIS — R1310 Dysphagia, unspecified: Secondary | ICD-10-CM | POA: Diagnosis present

## 2023-07-08 DIAGNOSIS — G8191 Hemiplegia, unspecified affecting right dominant side: Secondary | ICD-10-CM | POA: Diagnosis present

## 2023-07-08 DIAGNOSIS — I63522 Cerebral infarction due to unspecified occlusion or stenosis of left anterior cerebral artery: Principal | ICD-10-CM | POA: Diagnosis present

## 2023-07-08 DIAGNOSIS — I6300 Cerebral infarction due to thrombosis of unspecified precerebral artery: Secondary | ICD-10-CM | POA: Diagnosis not present

## 2023-07-08 DIAGNOSIS — I2510 Atherosclerotic heart disease of native coronary artery without angina pectoris: Secondary | ICD-10-CM | POA: Diagnosis present

## 2023-07-08 DIAGNOSIS — I252 Old myocardial infarction: Secondary | ICD-10-CM

## 2023-07-08 DIAGNOSIS — I63232 Cerebral infarction due to unspecified occlusion or stenosis of left carotid arteries: Secondary | ICD-10-CM | POA: Diagnosis not present

## 2023-07-08 DIAGNOSIS — H5340 Unspecified visual field defects: Secondary | ICD-10-CM | POA: Diagnosis present

## 2023-07-08 DIAGNOSIS — E1142 Type 2 diabetes mellitus with diabetic polyneuropathy: Secondary | ICD-10-CM | POA: Diagnosis not present

## 2023-07-08 DIAGNOSIS — Z950 Presence of cardiac pacemaker: Secondary | ICD-10-CM

## 2023-07-08 DIAGNOSIS — Z79899 Other long term (current) drug therapy: Secondary | ICD-10-CM | POA: Diagnosis not present

## 2023-07-08 DIAGNOSIS — E1165 Type 2 diabetes mellitus with hyperglycemia: Secondary | ICD-10-CM | POA: Diagnosis present

## 2023-07-08 DIAGNOSIS — Z87442 Personal history of urinary calculi: Secondary | ICD-10-CM

## 2023-07-08 DIAGNOSIS — F321 Major depressive disorder, single episode, moderate: Secondary | ICD-10-CM | POA: Diagnosis not present

## 2023-07-08 DIAGNOSIS — R4701 Aphasia: Secondary | ICD-10-CM | POA: Diagnosis present

## 2023-07-08 DIAGNOSIS — E118 Type 2 diabetes mellitus with unspecified complications: Secondary | ICD-10-CM | POA: Insufficient documentation

## 2023-07-08 DIAGNOSIS — Z7984 Long term (current) use of oral hypoglycemic drugs: Secondary | ICD-10-CM

## 2023-07-08 DIAGNOSIS — I69391 Dysphagia following cerebral infarction: Secondary | ICD-10-CM

## 2023-07-08 DIAGNOSIS — F329 Major depressive disorder, single episode, unspecified: Secondary | ICD-10-CM | POA: Diagnosis not present

## 2023-07-08 DIAGNOSIS — I480 Paroxysmal atrial fibrillation: Secondary | ICD-10-CM | POA: Diagnosis present

## 2023-07-08 DIAGNOSIS — E1140 Type 2 diabetes mellitus with diabetic neuropathy, unspecified: Secondary | ICD-10-CM | POA: Diagnosis present

## 2023-07-08 DIAGNOSIS — G8929 Other chronic pain: Secondary | ICD-10-CM | POA: Diagnosis not present

## 2023-07-08 DIAGNOSIS — R29810 Facial weakness: Secondary | ICD-10-CM | POA: Diagnosis present

## 2023-07-08 DIAGNOSIS — Z955 Presence of coronary angioplasty implant and graft: Secondary | ICD-10-CM | POA: Diagnosis not present

## 2023-07-08 DIAGNOSIS — Z7902 Long term (current) use of antithrombotics/antiplatelets: Secondary | ICD-10-CM

## 2023-07-08 DIAGNOSIS — G250 Essential tremor: Secondary | ICD-10-CM | POA: Diagnosis present

## 2023-07-08 DIAGNOSIS — Z8249 Family history of ischemic heart disease and other diseases of the circulatory system: Secondary | ICD-10-CM

## 2023-07-08 DIAGNOSIS — R531 Weakness: Principal | ICD-10-CM

## 2023-07-08 DIAGNOSIS — I618 Other nontraumatic intracerebral hemorrhage: Secondary | ICD-10-CM | POA: Diagnosis present

## 2023-07-08 DIAGNOSIS — Z794 Long term (current) use of insulin: Secondary | ICD-10-CM

## 2023-07-08 DIAGNOSIS — I63512 Cerebral infarction due to unspecified occlusion or stenosis of left middle cerebral artery: Secondary | ICD-10-CM | POA: Diagnosis present

## 2023-07-08 DIAGNOSIS — I6522 Occlusion and stenosis of left carotid artery: Secondary | ICD-10-CM

## 2023-07-08 DIAGNOSIS — Z833 Family history of diabetes mellitus: Secondary | ICD-10-CM

## 2023-07-08 DIAGNOSIS — Z823 Family history of stroke: Secondary | ICD-10-CM

## 2023-07-08 DIAGNOSIS — Z23 Encounter for immunization: Secondary | ICD-10-CM | POA: Diagnosis not present

## 2023-07-08 DIAGNOSIS — Z886 Allergy status to analgesic agent status: Secondary | ICD-10-CM

## 2023-07-08 DIAGNOSIS — R7989 Other specified abnormal findings of blood chemistry: Secondary | ICD-10-CM | POA: Diagnosis not present

## 2023-07-08 DIAGNOSIS — E7800 Pure hypercholesterolemia, unspecified: Secondary | ICD-10-CM | POA: Diagnosis present

## 2023-07-08 DIAGNOSIS — R29712 NIHSS score 12: Secondary | ICD-10-CM | POA: Diagnosis present

## 2023-07-08 DIAGNOSIS — I482 Chronic atrial fibrillation, unspecified: Secondary | ICD-10-CM | POA: Diagnosis not present

## 2023-07-08 DIAGNOSIS — E785 Hyperlipidemia, unspecified: Secondary | ICD-10-CM

## 2023-07-08 DIAGNOSIS — R471 Dysarthria and anarthria: Secondary | ICD-10-CM | POA: Diagnosis present

## 2023-07-08 DIAGNOSIS — I10 Essential (primary) hypertension: Secondary | ICD-10-CM

## 2023-07-08 DIAGNOSIS — I639 Cerebral infarction, unspecified: Secondary | ICD-10-CM | POA: Diagnosis present

## 2023-07-08 DIAGNOSIS — I6389 Other cerebral infarction: Secondary | ICD-10-CM | POA: Diagnosis not present

## 2023-07-08 DIAGNOSIS — H538 Other visual disturbances: Secondary | ICD-10-CM | POA: Diagnosis not present

## 2023-07-08 DIAGNOSIS — R079 Chest pain, unspecified: Secondary | ICD-10-CM | POA: Diagnosis not present

## 2023-07-08 DIAGNOSIS — I69351 Hemiplegia and hemiparesis following cerebral infarction affecting right dominant side: Secondary | ICD-10-CM | POA: Diagnosis not present

## 2023-07-08 DIAGNOSIS — Z1152 Encounter for screening for COVID-19: Secondary | ICD-10-CM | POA: Diagnosis not present

## 2023-07-08 DIAGNOSIS — I63132 Cerebral infarction due to embolism of left carotid artery: Secondary | ICD-10-CM | POA: Diagnosis not present

## 2023-07-08 DIAGNOSIS — I69392 Facial weakness following cerebral infarction: Secondary | ICD-10-CM | POA: Diagnosis not present

## 2023-07-08 DIAGNOSIS — I69398 Other sequelae of cerebral infarction: Secondary | ICD-10-CM | POA: Diagnosis not present

## 2023-07-08 HISTORY — PX: IR US GUIDE VASC ACCESS RIGHT: IMG2390

## 2023-07-08 HISTORY — PX: IR PERCUTANEOUS ART THROMBECTOMY/INFUSION INTRACRANIAL INC DIAG ANGIO: IMG6087

## 2023-07-08 HISTORY — PX: IR CT HEAD LTD: IMG2386

## 2023-07-08 HISTORY — PX: RADIOLOGY WITH ANESTHESIA: SHX6223

## 2023-07-08 LAB — RAPID URINE DRUG SCREEN, HOSP PERFORMED
Amphetamines: NOT DETECTED
Barbiturates: NOT DETECTED
Benzodiazepines: NOT DETECTED
Cocaine: NOT DETECTED
Opiates: NOT DETECTED
Tetrahydrocannabinol: NOT DETECTED

## 2023-07-08 LAB — CBC WITH DIFFERENTIAL/PLATELET
Abs Immature Granulocytes: 0.05 10*3/uL (ref 0.00–0.07)
Basophils Absolute: 0.1 10*3/uL (ref 0.0–0.1)
Basophils Relative: 1 %
Eosinophils Absolute: 0 10*3/uL (ref 0.0–0.5)
Eosinophils Relative: 0 %
HCT: 51.7 % (ref 39.0–52.0)
Hemoglobin: 17.7 g/dL — ABNORMAL HIGH (ref 13.0–17.0)
Immature Granulocytes: 1 %
Lymphocytes Relative: 10 %
Lymphs Abs: 0.9 10*3/uL (ref 0.7–4.0)
MCH: 32.5 pg (ref 26.0–34.0)
MCHC: 34.2 g/dL (ref 30.0–36.0)
MCV: 94.9 fL (ref 80.0–100.0)
Monocytes Absolute: 0.9 10*3/uL (ref 0.1–1.0)
Monocytes Relative: 10 %
Neutro Abs: 6.9 10*3/uL (ref 1.7–7.7)
Neutrophils Relative %: 78 %
Platelets: 154 10*3/uL (ref 150–400)
RBC: 5.45 MIL/uL (ref 4.22–5.81)
RDW: 13 % (ref 11.5–15.5)
WBC: 8.8 10*3/uL (ref 4.0–10.5)
nRBC: 0 % (ref 0.0–0.2)

## 2023-07-08 LAB — URINALYSIS, ROUTINE W REFLEX MICROSCOPIC
Bilirubin Urine: NEGATIVE
Glucose, UA: >=500 mg/dL — AB
Hgb urine dipstick: NEGATIVE
Ketones, ur: 80 mg/dL — AB
Leukocytes,Ua: NEGATIVE
Nitrite: NEGATIVE
Protein, ur: 30 mg/dL — AB
Specific Gravity, Urine: >1.046 — ABNORMAL HIGH (ref 1.005–1.030)
pH: 6 (ref 5.0–8.0)

## 2023-07-08 LAB — ETHANOL: Alcohol, Ethyl (B): <10 mg/dL (ref <10)

## 2023-07-08 LAB — BASIC METABOLIC PANEL
Anion gap: 15 (ref 5–15)
BUN: 22 mg/dL (ref 8–23)
CO2: 19 mmol/L — ABNORMAL LOW (ref 22–32)
Calcium: 9.5 mg/dL (ref 8.9–10.3)
Chloride: 103 mmol/L (ref 98–111)
Creatinine, Ser: 1.12 mg/dL (ref 0.61–1.24)
GFR, Estimated: >60 mL/min (ref >60)
Glucose, Bld: 102 mg/dL — ABNORMAL HIGH (ref 70–99)
Potassium: 4.3 mmol/L (ref 3.5–5.1)
Sodium: 137 mmol/L (ref 135–145)

## 2023-07-08 LAB — HEMOGLOBIN A1C
Hgb A1c MFr Bld: 7.1 % — ABNORMAL HIGH (ref 4.8–5.6)
Mean Plasma Glucose: 157.07 mg/dL

## 2023-07-08 LAB — I-STAT CHEM 8, ED
BUN: 26 mg/dL — ABNORMAL HIGH (ref 8–23)
Calcium, Ion: 1.13 mmol/L — ABNORMAL LOW (ref 1.15–1.40)
Chloride: 105 mmol/L (ref 98–111)
Creatinine, Ser: 1 mg/dL (ref 0.61–1.24)
Glucose, Bld: 100 mg/dL — ABNORMAL HIGH (ref 70–99)
HCT: 52 % (ref 39.0–52.0)
Hemoglobin: 17.7 g/dL — ABNORMAL HIGH (ref 13.0–17.0)
Potassium: 4.3 mmol/L (ref 3.5–5.1)
Sodium: 138 mmol/L (ref 135–145)
TCO2: 21 mmol/L — ABNORMAL LOW (ref 22–32)

## 2023-07-08 LAB — CBG MONITORING, ED: Glucose-Capillary: 91 mg/dL (ref 70–99)

## 2023-07-08 LAB — APTT: aPTT: 32 s (ref 24–36)

## 2023-07-08 LAB — TROPONIN I (HIGH SENSITIVITY)
Troponin I (High Sensitivity): 15 ng/L (ref <18)
Troponin I (High Sensitivity): 16 ng/L (ref <18)

## 2023-07-08 LAB — CK: Total CK: 378 U/L (ref 49–397)

## 2023-07-08 LAB — PROTIME-INR
INR: 1.2 (ref 0.8–1.2)
Prothrombin Time: 15.2 s (ref 11.4–15.2)

## 2023-07-08 LAB — SARS CORONAVIRUS 2 BY RT PCR: SARS Coronavirus 2 by RT PCR: NEGATIVE

## 2023-07-08 SURGERY — IR WITH ANESTHESIA
Anesthesia: General

## 2023-07-08 MED ORDER — STROKE: EARLY STAGES OF RECOVERY BOOK
Freq: Once | Status: AC
  Filled 2023-07-08: qty 1

## 2023-07-08 MED ORDER — DEXTROSE 50 % IV SOLN
INTRAVENOUS | Status: AC
  Administered 2023-07-08: 25 mL
  Filled 2023-07-08: qty 50

## 2023-07-08 MED ORDER — SODIUM CHLORIDE 0.9 % IV SOLN
INTRAVENOUS | Status: DC | PRN
Start: 2023-07-08 — End: 2023-07-08

## 2023-07-08 MED ORDER — ACETAMINOPHEN 325 MG PO TABS
650.0000 mg | ORAL_TABLET | ORAL | Status: DC | PRN
  Administered 2023-07-10: 650 mg via ORAL
  Filled 2023-07-08: qty 2

## 2023-07-08 MED ORDER — ROCURONIUM BROMIDE 10 MG/ML (PF) SYRINGE
PREFILLED_SYRINGE | INTRAVENOUS | Status: DC | PRN
  Administered 2023-07-08: 50 mg via INTRAVENOUS

## 2023-07-08 MED ORDER — PHENYLEPHRINE HCL-NACL 20-0.9 MG/250ML-% IV SOLN
INTRAVENOUS | Status: DC | PRN
  Administered 2023-07-08: 40 ug/min via INTRAVENOUS

## 2023-07-08 MED ORDER — CLEVIDIPINE BUTYRATE 0.5 MG/ML IV EMUL
0.0000 mg/h | INTRAVENOUS | Status: DC
  Administered 2023-07-08: 14 mg/h via INTRAVENOUS
  Administered 2023-07-09: 6 mg/h via INTRAVENOUS
  Filled 2023-07-08 (×5): qty 50

## 2023-07-08 MED ORDER — ACETAMINOPHEN 650 MG RE SUPP: 650.0000 mg | RECTAL | Status: DC | PRN

## 2023-07-08 MED ORDER — SODIUM CHLORIDE 0.9 % IV SOLN: INTRAVENOUS | Status: DC

## 2023-07-08 MED ORDER — CLEVIDIPINE BUTYRATE 0.5 MG/ML IV EMUL
INTRAVENOUS | Status: DC | PRN
Start: 2023-07-08 — End: 2023-07-08
  Administered 2023-07-08: 2 mg/h via INTRAVENOUS

## 2023-07-08 MED ORDER — SENNOSIDES-DOCUSATE SODIUM 8.6-50 MG PO TABS: 1.0000 | ORAL_TABLET | Freq: Every evening | ORAL | Status: DC | PRN

## 2023-07-08 MED ORDER — SUCCINYLCHOLINE CHLORIDE 200 MG/10ML IV SOSY
PREFILLED_SYRINGE | INTRAVENOUS | Status: DC | PRN
  Administered 2023-07-08: 100 mg via INTRAVENOUS

## 2023-07-08 MED ORDER — VERAPAMIL HCL 2.5 MG/ML IV SOLN
INTRAVENOUS | Status: AC
  Filled 2023-07-08: qty 2

## 2023-07-08 MED ORDER — ACETAMINOPHEN 160 MG/5ML PO SOLN: 650.0000 mg | ORAL | Status: DC | PRN

## 2023-07-08 MED ORDER — PROPOFOL 10 MG/ML IV BOLUS
INTRAVENOUS | Status: DC | PRN
Start: 2023-07-08 — End: 2023-07-08
  Administered 2023-07-08: 120 mg via INTRAVENOUS

## 2023-07-08 MED ORDER — CHLORHEXIDINE GLUCONATE CLOTH 2 % EX PADS
6.0000 | MEDICATED_PAD | Freq: Every day | CUTANEOUS | Status: DC
  Administered 2023-07-08 – 2023-07-10 (×3): 6 via TOPICAL

## 2023-07-08 MED ORDER — IOHEXOL 300 MG/ML  SOLN
150.0000 mL | Freq: Once | INTRAMUSCULAR | Status: AC | PRN
  Administered 2023-07-08: 50 mL via INTRA_ARTERIAL

## 2023-07-08 MED ORDER — DEXAMETHASONE SODIUM PHOSPHATE 10 MG/ML IJ SOLN
INTRAMUSCULAR | Status: DC | PRN
  Administered 2023-07-08: 5 mg via INTRAVENOUS

## 2023-07-08 MED ORDER — IOHEXOL 350 MG/ML SOLN
100.0000 mL | Freq: Once | INTRAVENOUS | Status: AC | PRN
  Administered 2023-07-08: 100 mL via INTRAVENOUS

## 2023-07-08 MED ORDER — FENTANYL CITRATE (PF) 250 MCG/5ML IJ SOLN
INTRAMUSCULAR | Status: DC | PRN
  Administered 2023-07-08 (×2): 50 ug via INTRAVENOUS

## 2023-07-08 MED ORDER — CEFAZOLIN SODIUM-DEXTROSE 2-4 GM/100ML-% IV SOLN
INTRAVENOUS | Status: AC
  Filled 2023-07-08: qty 100

## 2023-07-08 MED ORDER — DEXTROSE 50 % IV SOLN
INTRAVENOUS | Status: AC
  Filled 2023-07-08: qty 50

## 2023-07-08 MED ORDER — SODIUM CHLORIDE 0.9 % IV BOLUS: 250.0000 mL | INTRAVENOUS | Status: AC | PRN

## 2023-07-08 MED ORDER — FENTANYL CITRATE (PF) 100 MCG/2ML IJ SOLN
INTRAMUSCULAR | Status: AC
  Filled 2023-07-08: qty 2

## 2023-07-08 MED ORDER — SUGAMMADEX SODIUM 200 MG/2ML IV SOLN
INTRAVENOUS | Status: DC | PRN
  Administered 2023-07-08: 200 mg via INTRAVENOUS

## 2023-07-08 MED ORDER — LIDOCAINE 2% (20 MG/ML) 5 ML SYRINGE
INTRAMUSCULAR | Status: DC | PRN
  Administered 2023-07-08: 80 mg via INTRAVENOUS

## 2023-07-08 MED ORDER — ACETAMINOPHEN 325 MG PO TABS: 650.0000 mg | ORAL_TABLET | ORAL | Status: DC | PRN

## 2023-07-08 MED ORDER — ONDANSETRON HCL 4 MG/2ML IJ SOLN
INTRAMUSCULAR | Status: DC | PRN
  Administered 2023-07-08: 4 mg via INTRAVENOUS

## 2023-07-08 NOTE — Consult Note (Signed)
Stroke Neurology Admission History & Physical   CC: right sided weakness, dysarthria   History is obtained from: Son- Barbara Cower  HPI: Cory Brown is a 72 y.o. male with a past medical history of prior left MCA stroke s/p mechanical thrombectomy, A-fib with pacemaker on Pradaxa who presented to the ED via EMS due to a fall.  On initial exam he was found to be weak on the right side with slurred speech and a facial droop. A code stroke was activated in the ED.  Last known well is approximately 930 when his son was with him.  Send notes that he did have high blood pressure last night however he was in his usual state of health when he left.  He was found on the ground by a neighbor today and his son went to see him and saw that he was weaker on the right side with a right facial droop slurred speech and a "1000 yard stare".  At baseline it is reported that he has recovered well from his stroke and is currently ambulating independently but in physical therapy for minor right leg weakness.  He does all of his ADLs independently and is able to drive himself.   LKW: 2130 TNK given? No, on Pradaxa Mechanical thrombectomy? Yes. Code IR activated.   Premorbid modified Rankin scale (mRS): 1  ROS: Unable to obtain due to altered mental status.   Past Medical History:  Diagnosis Date   Acute bronchitis, unspecified 01/16/2022   Acute ischemic stroke (HCC) 12/05/2021   Arthritis    Atrial fibrillation (HCC)    Carpal tunnel syndrome 01/16/2022   Cerebral infarction, unspecified (HCC) 01/16/2022   Cerebrovascular accident [facility]) 01/16/2022   Dec 27, 2021 Entered By: [facility] D Comment: Left MCA stroke s/p thrombectomy, 12/05/2021.   Chronic atrial fibrillation (HCC) 09/17/2021   Coronary artery disease 01/16/2022   Dec 27, 2021 Entered By: [facility] D Comment: S/p stent Obtuse marginal 2, 10/2013.   Counseling, unspecified 01/16/2022   Diabetes mellitus without complication (HCC)    Essential (primary)  hypertension 01/16/2022   Generalized weakness 09/17/2021   Hypercholesterolemia    Hyperglycemia due to diabetes mellitus (HCC) 09/17/2021   Hyperlipidemia 01/16/2022   Hypertension    Hyponatremia 09/17/2021   Kidney stones    Low back pain 01/16/2022   MI (myocardial infarction) [facility])    Muscle weakness (generalized) 01/16/2022   Neck pain 01/16/2022   Non-traumatic rhabdomyolysis 09/19/2021   Special screening for malignant neoplasms, colon 11/30/2009   May 05, 2010 Entered By: [facility] D Comment: Normal per pt   Stroke (cerebrum) (HCC) 12/05/2021   Stroke (HCC) 2010   Thrombocytopenia (HCC) 09/18/2021   Traumatic rhabdomyolysis (HCC) 09/17/2021   Tremor    Ulnar neuropathy 01/16/2022    Family History  Problem Relation Age of Onset   Hypertension Mother    Stroke Mother    Diabetes Father    Cancer Sister    Diabetes Brother    Cancer Brother     Social History:   reports that he has never smoked. He has never used smokeless tobacco. He reports that he does not drink alcohol and does not use drugs.  Medications  Current Facility-Administered Medications:    [START ON 07/09/2023]  stroke: early stages of recovery book, , Does not apply, Once, Pamalee Leyden, [place], NP   0.9 %  sodium chloride infusion, , Intravenous, Continuous, Shafer, [place], NP   acetaminophen (TYLENOL) tablet 650 mg, 650 mg, Oral, Q4H PRN **OR**  acetaminophen (TYLENOL) 160 MG/5ML solution 650 mg, 650 mg, Per Tube, Q4H PRN **OR** acetaminophen (TYLENOL) suppository 650 mg, 650 mg, Rectal, Q4H PRN, Pamalee Leyden, [place], NP   senna-docusate (Senokot-S) tablet 1 tablet, 1 tablet, Oral, QHS PRN, Elmer Picker, NP  Current Outpatient Medications:    amLODipine (NORVASC) 5 MG tablet, Take 1 tablet by mouth daily., Disp: , Rfl:    dabigatran (PRADAXA) 150 MG CAPS capsule, Take 1 capsule (150 mg total) by mouth every 12 (twelve) hours., Disp: 60 capsule, Rfl: 1   empagliflozin (JARDIANCE) 25 MG TABS tablet, Take 12.5 mg by  mouth daily., Disp: , Rfl:    gabapentin (NEURONTIN) 300 MG capsule, Take 300 mg by mouth 2 (two) times daily., Disp: , Rfl:    glimepiride (AMARYL) 4 MG tablet, Take 4 mg by mouth daily with breakfast., Disp: , Rfl:    insulin glargine (LANTUS) 100 UNIT/ML injection, Inject 15 Units into the skin daily as needed (For Diabetes)., Disp: , Rfl:    losartan (COZAAR) 100 MG tablet, Take 100 mg by mouth daily., Disp: , Rfl:    metFORMIN (GLUCOPHAGE) 500 MG tablet, Take 500 mg by mouth 2 (two) times daily with a meal., Disp: , Rfl:    metoprolol tartrate (LOPRESSOR) 50 MG tablet, Take 1 tablet (50 mg total) by mouth 2 (two) times daily., Disp: 180 tablet, Rfl: 3   rosuvastatin (CRESTOR) 10 MG tablet, Take 10 mg by mouth daily., Disp: , Rfl:   Exam: Current vital signs: BP (!) 170/84   Pulse 71   Temp 97.8 F (36.6 C) (Oral)   Resp 18   SpO2 100%  Vital signs in last 24 hours: Temp:  [97.8 F (36.6 C)] 97.8 F (36.6 C) (09/08 1034) Pulse Rate:  [71] 71 (09/08 1034) Resp:  [18] 18 (09/08 1034) BP: (170)/(84) 170/84 (09/08 1034) SpO2:  [100 %] 100 % (09/08 1034)  GENERAL: Awake, alert in NAD HEENT: - Normocephalic and atraumatic, dry mm, no LN++, no Thyromegally LUNGS - Clear to auscultation bilaterally with no wheezes CV - S1S2 RRR, no m/r/g, equal pulses bilaterally. ABDOMEN - Soft, nontender, nondistended with normoactive BS Ext: warm, well perfused, intact peripheral pulses, no edema  NEURO:  Mental Status: Alert and oriented time 2- incorrect age Language: speech is dysarthric.  Naming and repetition fluctuate throughout the exam Cranial Nerves: PERRL 3 mm/brisk. EOMI, visual fields full, right facial droop, facial sensation intact, hearing intact, tongue/uvula/soft palate midline, normal sternocleidomastoid and trapezius muscle strength. No evidence of tongue atrophy or fasciculations Motor:  RUE 3-4+/5 -fluctuates throughout exam LUE 5/5 RLE 2-3/5     LLE 5/5 Tone: is normal and  bulk is normal Sensation- Diminished sensation on the right side Coordination: Ataxia not out of proportion to weakness Gait- deferred  NIHSS 1a Level of Conscious.: 0 1b LOC Questions: 1 1c LOC Commands: 0 2 Best Gaze: 1 3 Visual:  4 Facial Palsy: 1  5a Motor Arm - left: 0 5b Motor Arm - Right: 1  6a Motor Leg - Left: 0 6b Motor Leg - Right: 2 7 Limb Ataxia: 0 8 Sensory: 1 9 Best Language: 2 10 Dysarthria: 1 11 Extinct. and Inatten.: 0 TOTAL: 10 -fluctuating exam with worsening after CTA/P. To NIHS 12.  Labs I have reviewed labs in epic and the results pertinent to this consultation are:  CBC    Component Value Date/Time   WBC 8.8 07/08/2023 1052   RBC 5.45 07/08/2023 1052   HGB 17.7 (H) 07/08/2023  1104   HCT 52.0 07/08/2023 1104   PLT 154 07/08/2023 1052   MCV 94.9 07/08/2023 1052   MCH 32.5 07/08/2023 1052   MCHC 34.2 07/08/2023 1052   RDW 13.0 07/08/2023 1052   LYMPHSABS 0.9 07/08/2023 1052   MONOABS 0.9 07/08/2023 1052   EOSABS 0.0 07/08/2023 1052   BASOSABS 0.1 07/08/2023 1052    CMP     Component Value Date/Time   NA 138 07/08/2023 1104   K 4.3 07/08/2023 1104   CL 105 07/08/2023 1104   CO2 19 (L) 07/08/2023 1052   GLUCOSE 100 (H) 07/08/2023 1104   BUN 26 (H) 07/08/2023 1104   CREATININE 1.00 07/08/2023 1104   CALCIUM 9.5 07/08/2023 1052   PROT 7.4 12/06/2021 0438   ALBUMIN 4.2 12/06/2021 0438   AST 28 12/06/2021 0438   ALT 29 12/06/2021 0438   ALKPHOS 85 12/06/2021 0438   BILITOT 1.1 12/06/2021 0438   GFRNONAA >60 07/08/2023 1052   GFRAA >90 01/07/2014 1900    Lipid Panel     Component Value Date/Time   CHOL 151 12/06/2021 0438   TRIG 41 12/06/2021 0438   HDL 44 12/06/2021 0438   CHOLHDL 3.4 12/06/2021 0438   VLDL 8 12/06/2021 0438   LDLCALC 99 12/06/2021 0438     Imaging I have reviewed the images obtained:  CT-head 1. Nonspecific rounded hyperdensity in the left thalamus measuring 5 mm, which could represent chronic left  thalamic hemorrhage. 2. Age indeterminate infarcts in the left basal ganglia. 3. Redemonstrated chronic left MCA territory infarct and chronic right frontal lobe infarct.  CTA Head and Neck with perfusion Left ICA occlusion  Assessment/Plan:   Acute Ischemic Infarct  Acuity: Acute Current Suspected Etiology:  Continue Evaluation:  -Admit to: ICU - Blood Pressure Goal: BP less than 220/110 until procedure -> SBP 120-160 -MRI/ECHO/A1C/Lipid panel. -Hyperglycemia management per SSI to maintain glucose 140-180mg /dL. -PT/OT/ST therapies and recommendations when able  CNS -Close neuro monitoring  Dysarthria Dysphagia following cerebral infarction  -NPO until cleared by speech -ST  Hemiplegia and hemiparesis following cerebral infarction affecting right dominant side -PT/OT  RESP Intubated for procedure - Extubate in PACU  CV Essential (primary) hypertension -Aggressive BP control, Blood Pressure Goal: BP less than 220/110 until procedure -> SBP 120-160 per IR team -Titrate oral agents - IV Cleviprex -TTE  Hyperlipidemia, unspecified  - Statin for goal LDL < 70  Paroxysmal atrial fibrillation -Rate control -Home med: Pradaxa  HEME -Monitor -transfuse for hgb < 7  ENDO -SSI -goal HgbA1c < 7  GI/GU -Gentle hydration  Fluid/Electrolyte Disorders -Replete -Repeat labs -Trend  ID -CXR -NPO -Monitor  Nutrition -NPO  Prophylaxis DVT:  SCDs GI: Protonix Bowel: Senna  Diet: NPO until cleared by speech  Code Status: Full code   THE FOLLOWING WERE PRESENT ON ADMISSION: CNS -  Acute Ischemic Stroke, left Hemiparesis Cardiovascular -essential hypertension, paroxysmal atrial fibrillation Heme-coagulopathy due to anticoagulation --  Patient seen and examined by NP/APP with MD. MD to update note as needed.   Elmer Picker, DNP, FNP-BC [facility] Pager: [phone]  ATTENDING ATTESTATION:  H/o Left MCA stroke s/p thrombectomy  about a year ago with residual right leg weakness but able to do all ADLs. Worsening right side weakness with aphasia, left gaze deviation when found down by his son who is a paramedic.  Not in window for TNK and on pradaxa(compliant) but CTA shows left ICA occlusion. Perfusion shows mismatch of 16 but suspect penumbra is much larger  given fluctuating exam or unclear timing of onset therefore thrombectomy was appropriate and code IR activated from CT scanner and taken straight to IR suite after discussion with Dr. [name]. Also discussed scan with radiologist vis telephone. Son updated and consent signed. Will admit to ICU for close monitoring.   Dr. Viviann Spare evaluated pt independently, reviewed imaging, chart, labs. Discussed and formulated plan with the Resident/APP. Changes were made to the note where appropriate. Please see APP/resident note above for details.   Total 36 minutes spent on counseling patient and coordinating care, writing notes and reviewing chart.  MDM: High. Pertinent labs, imaging results reviewed by me and considered in my decision making. Independently reviewed imaging. Medical records reviewed. Discussed the patient with another medical provider/personnel. Obtained history from someone other than the patient-son.   This patient is critically ill due code stroke s/p IR and at significant risk of neurological worsening, death form heart failure, respiratory failure, recurrent stroke, bleeding from [facility], seizure, sepsis. This patient's care requires constant monitoring of vital signs, hemodynamics, respiratory and cardiac monitoring, review of multiple databases, neurological assessment, discussion with family, other specialists and medical decision making of high complexity. I spent 60 minutes of neurocritical care time in the care of this patient.   Trason Shifflet,MD

## 2023-07-08 NOTE — Anesthesia Preprocedure Evaluation (Addendum)
Anesthesia Evaluation  Patient identified by MRN, date of birth, ID band Patient awake    Reviewed: Allergy & Precautions, NPO status , Patient's Chart, lab work & pertinent test results, reviewed documented beta blocker date and time , Unable to perform ROS - Chart review onlyPreop documentation limited or incomplete due to emergent nature of procedure.  Airway Mallampati: II  TM Distance: >3 FB Neck ROM: Full    Dental  (+) Dental Advisory Given   Pulmonary neg pulmonary ROS   Pulmonary exam normal breath sounds clear to auscultation       Cardiovascular hypertension, Pt. on medications and Pt. on home beta blockers + CAD, + Past MI and + Cardiac Stents  + dysrhythmias Atrial Fibrillation + pacemaker  Rhythm:Irregular Rate:Abnormal     Neuro/Psych  Neuromuscular disease CVA, Residual Symptoms    GI/Hepatic negative GI ROS, Neg liver ROS,,,  Endo/Other  diabetes, Type 2, Oral Hypoglycemic Agents, Insulin Dependent    Renal/GU negative Renal ROS     Musculoskeletal  (+) Arthritis ,    Abdominal   Peds  Hematology  (+) Blood dyscrasia (Pradaxa)   Anesthesia Other Findings Day of surgery medications reviewed with the patient.  Reproductive/Obstetrics                             Anesthesia Physical Anesthesia Plan  ASA: 4 and emergent  Anesthesia Plan: General   Post-op Pain Management:    Induction: Intravenous and Rapid sequence  PONV Risk Score and Plan: 2 and Dexamethasone and Ondansetron  Airway Management Planned: Oral ETT  Additional Equipment: Arterial line  Intra-op Plan:   Post-operative Plan: Possible Post-op intubation/ventilation  Informed Consent:      Dental advisory given, Only emergency history available and History available from chart only  Plan Discussed with: CRNA  Anesthesia Plan Comments: (Pre-op eval completed after induction of anesthesia due to  emergent nature of procedure.)       Anesthesia Quick Evaluation

## 2023-07-08 NOTE — ED Provider Notes (Signed)
[facility] DEPARTMENT AT [facility] Provider Note   CSN: [id number] Arrival date & time: 07/08/23  1021  An emergency department physician performed an initial assessment on this suspected stroke patient at 1048.  History  Chief Complaint  Patient presents with   Fall   Code Stroke    Fall    Cory Brown is a 71 y.o. 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  The patient's son by phone reports that the patient was seen normal last evening around 9 or 10 PM, when the son was visiting.  The patient did have high blood pressure at the time.  This morning the patient was found down in his home by a neighbor family member.  When the son went to visit he was concern that the patient had a "1000 yard stare" and also had right-sided facial droop, slurred speech, and the weakness and more profound on his right side.  Per entheses the patient does have some residual right leg weakness from his prior stroke).  The patient's son, who is a former Artist, reported concern for stroke and called EMS.  At baseline the patient's son reports the patient has incredibly clear speech, no slurred speech, and is in physical therapy for right lower extremity weakness, thought to be related to lower back problems.  The patient was off reports he has a mild headache.  He is slow to respond and cannot follow all commands.  The patient did not take his morning Pradaxa.  Last dose yesterday evening.  HPI     Home Medications Prior to Admission medications   Medication Sig Start Date End Date Taking? Authorizing Provider  amLODipine (NORVASC) 5 MG tablet Take 1 tablet by mouth daily. 12/08/20   [provider]  dabigatran (PRADAXA) 150 MG CAPS capsule Take 1 capsule (150 mg total) by mouth every 12  (twelve) hours. 12/08/21   de Saintclair Halsted, Cortney E, NP  empagliflozin (JARDIANCE) 25 MG TABS tablet Take 12.5 mg by mouth daily.    [provider]  gabapentin (NEURONTIN) 300 MG capsule Take 300 mg by mouth 2 (two) times daily.    [provider]  glimepiride (AMARYL) 4 MG tablet Take 4 mg by mouth daily with breakfast. 03/31/20   [provider]  insulin glargine (LANTUS) 100 UNIT/ML injection Inject 15 Units into the skin daily as needed (For Diabetes). 03/31/20   [provider]  losartan (COZAAR) 100 MG tablet Take 100 mg by mouth daily. 03/31/20   [provider]  metFORMIN (GLUCOPHAGE) 500 MG tablet Take 500 mg by mouth 2 (two) times daily with a meal.    [provider]  metoprolol tartrate (LOPRESSOR) 50 MG tablet Take 1 tablet (50 mg total) by mouth 2 (two) times daily. 02/22/22   Revankar, Aundra Dubin, MD  rosuvastatin (CRESTOR) 10 MG tablet Take 10 mg by mouth daily.    [provider]      Allergies    Patient has no known allergies.    Review of Systems   Review of Systems  Physical Exam Updated Vital Signs BP (!) 142/73 (BP Location: Right Arm)   Pulse 70   Temp (!) 97 F (36.1 C)   Resp 19   Wt 94.5 kg   SpO2 100%   BMI 27.49  kg/m  Physical Exam Constitutional:      General: He is not in acute distress. HENT:     Head: Normocephalic and atraumatic.  Eyes:     Conjunctiva/sclera: Conjunctivae normal.     Pupils: Pupils are equal, round, and reactive to light.  Cardiovascular:     Rate and Rhythm: Normal rate and regular rhythm.  Pulmonary:     Effort: Pulmonary effort is normal. No respiratory distress.  Abdominal:     General: There is no distension.     Tenderness: There is no abdominal tenderness.  Skin:    General: Skin is warm and dry.  Neurological:     Mental Status: He is alert.     Comments: Slurred speech, right-sided facial droop, 3/5 strength to grip and arm raise on the right UE, 3/5 strength  RLE, 5/5 strength on the left upper and lower extremity  Psychiatric:        Mood and Affect: Mood normal.        Behavior: Behavior normal.     ED Results / Procedures / Treatments   Labs (all labs ordered are listed, but only abnormal results are displayed) Labs Reviewed  BASIC METABOLIC PANEL - Abnormal; Notable for the following components:      Result Value   CO2 19 (*)    Glucose, Bld 102 (*)    All other components within normal limits  CBC WITH DIFFERENTIAL/PLATELET - Abnormal; Notable for the following components:   Hemoglobin 17.7 (*)    All other components within normal limits  I-STAT CHEM 8, ED - Abnormal; Notable for the following components:   BUN 26 (*)    Glucose, Bld 100 (*)    Calcium, Ion 1.13 (*)    TCO2 21 (*)    Hemoglobin 17.7 (*)    All other components within normal limits  SARS CORONAVIRUS 2 BY RT PCR  CK  URINALYSIS, ROUTINE W REFLEX MICROSCOPIC  ETHANOL  PROTIME-INR  APTT  RAPID URINE DRUG SCREEN, HOSP PERFORMED  HEMOGLOBIN A1C  CBG MONITORING, ED  TROPONIN I (HIGH SENSITIVITY)  TROPONIN I (HIGH SENSITIVITY)    EKG EKG Interpretation Date/Time:  Sunday July 08 2023 10:36:21 EDT Ventricular Rate:  70 PR Interval:  184 QRS Duration:  143 QT Interval:  455 QTC Calculation: 491 R Axis:   112  Text Interpretation: Sinus rhythm Nonspecific intraventricular conduction delay Probable anteroseptal infarct, old Confirmed by Alvester Chou [phone]) on 07/08/2023 10:41:56 AM  Radiology CT HEAD CODE STROKE WO CONTRAST  Result Date: 07/08/2023 CLINICAL DATA:  Code stroke.  Neuro deficit, acute, stroke suspected EXAM: CT HEAD WITHOUT CONTRAST TECHNIQUE: Contiguous axial images were obtained from the base of the skull through the vertex without intravenous contrast. RADIATION DOSE REDUCTION: This exam was performed according to the departmental dose-optimization program which includes automated exposure control, adjustment of the mA and/or kV  according to patient size and/or use of iterative reconstruction technique. COMPARISON:  CT head 01/08/23 FINDINGS: Brain: There is a nonspecific rounded hyperdensity in the left thalamus measuring 5 mm, which could represent chronic left thalamic hemorrhage. Age indeterminate infarcts in the left basal ganglia. No hydrocephalus. No extra-axial fluid collection. Redemonstrated chronic left MCA territory infarct unchanged compared to prior exam. There is also a chronic right frontal lobe infarct, unchanged. No CT evidence of a new cortical infarct. There is a chronic right thalamic infarct Vascular: No hyperdense vessel or unexpected calcification. Skull: Normal. Negative for fracture or focal lesion. Sinuses/Orbits: No middle  ear or mastoid effusion. Paranasal sinuses are clear. Orbits are unremarkable. Other: None. ASPECTS [facility] Stroke Program Early CT Score): No localizing sign provided. IMPRESSION: 1. Nonspecific rounded hyperdensity in the left thalamus measuring 5 mm, which could represent chronic left thalamic hemorrhage. 2. Age indeterminate infarcts in the left basal ganglia. 3. Redemonstrated chronic left MCA territory infarct and chronic right frontal lobe infarct. Electronically Signed   By: Lorenza Cambridge M.D.   On: 07/08/2023 11:11    Procedures .Critical Care  Performed by: Terald Sleeper, MD Authorized by: Terald Sleeper, MD   Critical care provider statement:    Critical care time (minutes):  30   Critical care time was exclusive of:  Separately billable procedures and treating other patients   Critical care was necessary to treat or prevent imminent or life-threatening deterioration of the following conditions:  CNS failure or compromise   Critical care was time spent personally by me on the following activities:  Ordering and performing treatments and interventions, ordering and review of laboratory studies, ordering and review of radiographic studies, pulse oximetry, review of  old charts, examination of patient and evaluation of patient's response to treatment   Care discussed with: admitting provider   Comments:     Code stroke evaluation, discussion with specialist,     Medications Ordered in ED Medications   stroke: early stages of recovery book (has no administration in time range)  0.9 %  sodium chloride infusion (has no administration in time range)  acetaminophen (TYLENOL) tablet 650 mg (has no administration in time range)    Or  acetaminophen (TYLENOL) 160 MG/5ML solution 650 mg (has no administration in time range)    Or  acetaminophen (TYLENOL) suppository 650 mg (has no administration in time range)  senna-docusate (Senokot-S) tablet 1 tablet (has no administration in time range)  dextrose 50 % solution (50 mLs  Not Given 07/08/23 1336)  iohexol (OMNIPAQUE) 350 MG/ML injection 100 mL (100 mLs Intravenous Contrast Given 07/08/23 1126)  iohexol (OMNIPAQUE) 300 MG/ML solution 150 mL (50 mLs Intra-arterial Contrast Given 07/08/23 1324)  dextrose 50 % solution (25 mLs  Given 07/08/23 1338)    ED Course/ Medical Decision Making/ A&P Clinical Course as of 07/08/23 1353  Sun Jul 08, 2023  1038 No pradaxa taken this morning [MT]  1112 Pt at CTA with neuro now [MT]    Clinical Course User Index [MT] Terald Sleeper, MD                                 Medical Decision Making Amount and/or Complexity of Data Reviewed Labs: ordered. Radiology: ordered.  Risk Decision regarding hospitalization.   This patient presents to the ED with concern for unilateral weakness, slurred speech. This involves an extensive number of treatment options, and is a complaint that carries with it a high risk of complications and morbidity.  The differential diagnosis includes CVA versus metabolic encephalopathy versus infection versus other  Co-morbidities that complicate the patient evaluation: History of prior stroke, cardiovascular risk factors  Additional history  obtained from EMS, patient's son by phone  External records from outside source obtained and reviewed including prior left MCA occlusion  I ordered and personally interpreted labs.  The pertinent results include: No emergent findings.  CK level within normal limits.  Glucose within normal limit  I ordered imaging studies including CT head, CTA I independently visualized and interpreted imaging which  showed age-indeterminate infarcts, prior residual left MCA infarct; CTA showing left proximal ICA occlusion I agree with the radiologist interpretation  The patient was maintained on a cardiac monitor.   Per my interpretation the patient's ECG shows no acute ischemic findings  I have reviewed the patients home medicines and have made adjustments as needed  I requested consultation with the neurology,  and discussed lab and imaging findings as well as pertinent plan - they recommend:  IR for left ICA occlusion, will admit patient  Neurology team in discussion with patient's son about intervention  After the interventions noted above, I reevaluated the patient and found that they have: stayed the same  Dispostion:  After consideration of the diagnostic results and the patients response to treatment, I feel that the patent would benefit from admission.         Final Clinical Impression(s) / ED Diagnoses Final diagnoses:  Right sided weakness    Rx / DC Orders ED Discharge Orders     None         Terald Sleeper, MD 07/08/23 1353

## 2023-07-08 NOTE — Procedures (Signed)
INTERVENTIONAL NEURORADIOLOGY BRIEF POSTPROCEDURE NOTE  DIAGNOSTIC CEREBRAL ANGIOGRAM, MECHANICAL THROMBECTOMY  Attending physician: Baldemar Lenis, MD  Diagnosis: Left ICA occlusion  Access site: Right common femoral artery.  Access closure: Perclose Prostyle.  Anesthesia: IR sedation: General endotracheal anesthesia.  Complications: None.  Estimated blood loss:  50 mL.  Specimen: None.  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 Mechanical thrombectomy performed with direct contact aspiration (x3) retrieving large amount of clot. Complete recanalization achieved (TICI 3). No hemorrhagic complication on the post procedural flat panel CT.  The patient tolerated the procedure well without incident or complication. He was extubated and transferred to ICU in stable condition.   PLAN: - Bed rest x6 hours. May raise HOB 30 degrees after 1.5 hours. - SBP 120-160 mmHg.

## 2023-07-08 NOTE — Anesthesia Procedure Notes (Signed)
Procedure Name: Intubation Date/Time: 07/08/2023 11:59 AM  Performed by: Elliot Dally, CRNAPre-anesthesia Checklist: Patient identified, Emergency Drugs available, Suction available and Patient being monitored Patient Re-evaluated:Patient Re-evaluated prior to induction Oxygen Delivery Method: Circle System Utilized Preoxygenation: Pre-oxygenation with 100% oxygen Induction Type: IV induction, Rapid sequence and Cricoid Pressure applied Ventilation: Mask ventilation without difficulty Laryngoscope Size: Mac and 3 Grade View: Grade II Tube type: Oral Tube size: 7.5 mm Number of attempts: 1 Airway Equipment and Method: Stylet and Oral airway Placement Confirmation: ETT inserted through vocal cords under direct vision, positive ETCO2 and breath sounds checked- equal and bilateral Secured at: 24 cm Tube secured with: Tape Dental Injury: Teeth and Oropharynx as per pre-operative assessment

## 2023-07-08 NOTE — Transfer of Care (Signed)
Immediate Anesthesia Transfer of Care Note  Patient: MART MARECKI  Procedure(s) Performed: IR WITH ANESTHESIA  Patient Location: PACU  Anesthesia Type:General  Level of Consciousness: drowsy  Airway & Oxygen Therapy: Patient Spontanous Breathing and Patient connected to nasal cannula oxygen  Post-op Assessment: Report given to RN and Post -op Vital signs reviewed and stable  Post vital signs: Reviewed and stable  Last Vitals:  Vitals Value Taken Time  BP    Temp    Pulse    Resp    SpO2      Last Pain:  Vitals:   07/08/23 1151  TempSrc:   PainSc: 0-No pain         Complications: No notable events documented.

## 2023-07-08 NOTE — H&P (Addendum)
   Please see same day consult note for full H&P   Elmer Picker, DNP, FNP-BC [facility] Pager: [phone]'

## 2023-07-08 NOTE — ED Triage Notes (Signed)
PT BIB EMS for a fall from home yesterday, per EMS patient does not have any complaints but the son thinks he may have had a stroke.  Pt is in Physical Therapy for right sided weakness, from a previous stroke.  166/92 HR 80 Resp 18 98 RA  CBG 99

## 2023-07-08 NOTE — Anesthesia Procedure Notes (Signed)
Arterial Line Insertion Start/End9/05/2023 11:49 AM, 07/08/2023 11:52 AM Performed by: Aundria Rud, CRNA  Patient location: Pre-op. Preanesthetic checklist: patient identified, IV checked, site marked, risks and benefits discussed, surgical consent, monitors and equipment checked, pre-op evaluation, timeout performed and anesthesia consent Lidocaine 1% used for infiltration Left, radial was placed Catheter size: 20 G Hand hygiene performed  and maximum sterile barriers used   Attempts: 1 Procedure performed without using ultrasound guided technique. Following insertion, dressing applied and Biopatch. Post procedure assessment: normal and unchanged  Patient tolerated the procedure well with no immediate complications.

## 2023-07-08 NOTE — Code Documentation (Signed)
Stroke Response Nurse Documentation Code Documentation  AVREY TANAKA is a 72 y.o. male arriving to [facility]  via [facility] on 07-08-2023 with past medical hx of CVA, AF, DM . On Pradaxa (dabigatran) twice a day. Code stroke was activated by ED.   Patient from home where he was LKW at 07-07-2023 2130 and now complaining of Right side weakness and difficulty speaking.  Per Son he was his normal self last night at 2130.  Today when his son arrived he found him after a fall.  Stroke team at the bedside on patient arrival. Labs drawn and patient cleared for CT by Dr. Renaye Rakers. Patient to CT with team. NIHSS 10, see documentation for details and code stroke times. Patient with disoriented, left gaze preference , right facial droop, right arm weakness, right leg weakness, right decreased sensation, Expressive aphasia , and dysarthria  on exam. The following imaging was completed:  CT Head, CTA, and CTP. Patient is not a candidate for IV Thrombolytic due to Lkw last night. Patient is a candidate for IR.  Patient to IR at 1132   Care Plan: VS and NIHSS q 2 hours x 12.   Bedside handoff with IR RN .    Marcellina Millin  Stroke Response RN

## 2023-07-09 ENCOUNTER — Inpatient Hospital Stay (HOSPITAL_COMMUNITY): Payer: No Typology Code available for payment source

## 2023-07-09 ENCOUNTER — Inpatient Hospital Stay (HOSPITAL_COMMUNITY): Payer: Non-veteran care

## 2023-07-09 ENCOUNTER — Encounter (HOSPITAL_COMMUNITY): Payer: Self-pay | Admitting: Radiology

## 2023-07-09 DIAGNOSIS — I6300 Cerebral infarction due to thrombosis of unspecified precerebral artery: Secondary | ICD-10-CM | POA: Diagnosis not present

## 2023-07-09 DIAGNOSIS — I6389 Other cerebral infarction: Secondary | ICD-10-CM | POA: Diagnosis not present

## 2023-07-09 LAB — ECHOCARDIOGRAM COMPLETE
AR max vel: 1.79 cm2
AV Area VTI: 1.66 cm2
AV Area mean vel: 1.76 cm2
AV Mean grad: 8 mmHg
AV Peak grad: 14 mmHg
Ao pk vel: 1.87 m/s
Area-P 1/2: 3.6 cm2
S' Lateral: 3.3 cm
Weight: 3333.36 [oz_av]

## 2023-07-09 LAB — GLUCOSE, CAPILLARY
Glucose-Capillary: 66 mg/dL — ABNORMAL LOW (ref 70–99)
Glucose-Capillary: 117 mg/dL — ABNORMAL HIGH (ref 70–99)

## 2023-07-09 LAB — LIPID PANEL
Cholesterol: 88 mg/dL (ref 0–200)
HDL: 34 mg/dL — ABNORMAL LOW (ref >40)
LDL Cholesterol: 39 mg/dL (ref 0–99)
Total CHOL/HDL Ratio: 2.6 ratio
Triglycerides: 74 mg/dL (ref <150)
VLDL: 15 mg/dL (ref 0–40)

## 2023-07-09 MED ORDER — ROSUVASTATIN CALCIUM 5 MG PO TABS
10.0000 mg | ORAL_TABLET | Freq: Every day | ORAL | Status: DC
  Administered 2023-07-09 – 2023-07-11 (×3): 10 mg via ORAL
  Filled 2023-07-09 (×3): qty 2

## 2023-07-09 MED ORDER — POLYVINYL ALCOHOL 1.4 % OP SOLN: 1.0000 [drp] | OPHTHALMIC | Status: DC | PRN

## 2023-07-09 MED ORDER — DABIGATRAN ETEXILATE MESYLATE 150 MG PO CAPS
150.0000 mg | ORAL_CAPSULE | Freq: Two times a day (BID) | ORAL | Status: DC
  Administered 2023-07-09 – 2023-07-11 (×4): 150 mg via ORAL
  Filled 2023-07-09 (×6): qty 1

## 2023-07-09 MED ORDER — GABAPENTIN 100 MG PO CAPS
100.0000 mg | ORAL_CAPSULE | Freq: Every day | ORAL | Status: DC
  Administered 2023-07-09 – 2023-07-11 (×3): 100 mg via ORAL
  Filled 2023-07-09 (×3): qty 1

## 2023-07-09 MED ORDER — METOPROLOL SUCCINATE ER 50 MG PO TB24
50.0000 mg | ORAL_TABLET | Freq: Every day | ORAL | Status: DC
  Administered 2023-07-09 – 2023-07-10 (×2): 50 mg via ORAL
  Filled 2023-07-09 (×2): qty 1

## 2023-07-09 NOTE — Progress Notes (Signed)
Family at the bedside providing support to the patient.

## 2023-07-09 NOTE — Progress Notes (Signed)
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 Patient with some mild confusion at times but otherwise responds appropriately to questions.   Allergies: Acetaminophen  Medications: Prior to Admission medications   Medication Sig Start Date End Date Taking? Authorizing Provider  dabigatran (PRADAXA) 150 MG CAPS capsule Take 1 capsule (150 mg total) by mouth every 12 (twelve) hours. 12/08/21  Yes de Saintclair Halsted, Cortney E, NP  empagliflozin (JARDIANCE) 25 MG TABS tablet Take 12.5 mg by mouth daily.   Yes [provider]  gabapentin (NEURONTIN) 100 MG capsule Take 100 mg by mouth daily.   Yes [provider]  glimepiride (AMARYL) 2 MG tablet Take 1 mg by mouth daily.   Yes [provider]  metFORMIN (GLUCOPHAGE) 500 MG tablet Take 500 mg by mouth 2 (two) times daily with a meal.   Yes [provider]  methocarbamol (ROBAXIN) 500 MG tablet Take 500 mg by mouth 3 (three) times daily. For Pain   Yes [provider]  metoprolol succinate (TOPROL-XL) 100 MG 24 hr tablet Take 100 mg by mouth every evening. Take with or immediately following a meal.   Yes [provider]  rosuvastatin (CRESTOR) 20 MG tablet Take 10 mg by mouth daily. For Cholesterol   Yes [provider]     Vital Signs: BP (!) 154/74   Pulse 76   Temp 99 F (37.2 C)   Resp 14   Wt 208 lb 5.4 oz (94.5 kg)   SpO2 96%   BMI 27.49 kg/m   Physical Exam Alert, awake, and oriented to self. Knows he is in the hospital but is unsure of which hospital and unable to identify year Speech and comprehension intact PERRL  EOMs intact  Visual field deficit on right. Left visual field intact Right facial droop Tongue midline  Motor power 5/5 in  all extremities No pronator drift. Fine motor and coordination slightly decreased on right Sensation decreased on right Right groin puncture site soft, non-tender, no bleeding or hematoma noted Imaging: ECHOCARDIOGRAM COMPLETE  Result Date: 07/09/2023    ECHOCARDIOGRAM REPORT   Patient Name:   Cory Brown Date of Exam: 07/09/2023 Medical Rec #:  [id number]     Height:       73.0 in Accession #:    [id number]    Weight:       208.3 lb Date of Birth:  26-Nov-1950     BSA:          2.189 m Patient Age:    72 years      BP:           137/63 mmHg Patient Gender: M             HR:           71 bpm. Exam Location:  Inpatient Procedure: 2D Echo, Cardiac Doppler and Color Doppler Indications:    Stroke I63.9  History:        Patient has prior history of Echocardiogram examinations, most                 recent 12/06/2021. Stroke; Risk Factors:Diabetes, Hypertension,                 Dyslipidemia and Non-Smoker.  Sonographer:  Dondra Prader RVT RCS Referring Phys: [id number] [facility]  Sonographer Comments: Suboptimal subcostal window. IMPRESSIONS  1. Left ventricular ejection fraction, by estimation, is 60 to 65%. The left ventricle has normal function. The left ventricle has no regional wall motion abnormalities. There is mild concentric left ventricular hypertrophy. Left ventricular diastolic parameters are indeterminate.  2. Right ventricular systolic function is normal. The right ventricular size is normal. There is mildly elevated pulmonary artery systolic pressure.  3. Left atrial size was severely dilated.  4. Right atrial size was severely dilated.  5. The mitral valve is grossly normal. Trivial mitral valve regurgitation.  6. Tricuspid valve regurgitation is mild to moderate.  7. The aortic valve is tricuspid. There is mild calcification of the aortic valve. Aortic valve regurgitation is not visualized.  8. The inferior vena cava is normal in size with greater than 50% respiratory variability, suggesting right  atrial pressure of 3 mmHg. Conclusion(s)/Recommendation(s): No intracardiac source of embolism detected on this transthoracic study. Consider a transesophageal echocardiogram to exclude cardiac source of embolism if clinically indicated. FINDINGS  Left Ventricle: Left ventricular ejection fraction, by estimation, is 60 to 65%. The left ventricle has normal function. The left ventricle has no regional wall motion abnormalities. The left ventricular internal cavity size was normal in size. There is  mild concentric left ventricular hypertrophy. Left ventricular diastolic parameters are indeterminate. Right Ventricle: The right ventricular size is normal. Right ventricular systolic function is normal. There is mildly elevated pulmonary artery systolic pressure. The tricuspid regurgitant velocity is 3.15 m/s, and with an assumed right atrial pressure of 3 mmHg, the estimated right ventricular systolic pressure is 42.7 mmHg. Left Atrium: Left atrial size was severely dilated. Right Atrium: Right atrial size was severely dilated. Pericardium: There is no evidence of pericardial effusion. Mitral Valve: The mitral valve is grossly normal. Trivial mitral valve regurgitation. Tricuspid Valve: The tricuspid valve is normal in structure. Tricuspid valve regurgitation is mild to moderate. Aortic Valve: The aortic valve is tricuspid. There is mild calcification of the aortic valve. Aortic valve regurgitation is not visualized. Aortic valve mean gradient measures 8.0 mmHg. Aortic valve peak gradient measures 14.0 mmHg. Aortic valve area, by  VTI measures 1.66 cm. Pulmonic Valve: Pulmonic valve regurgitation is not visualized. Aorta: The aortic root and ascending aorta are structurally normal, with no evidence of dilitation. Venous: The inferior vena cava is normal in size with greater than 50% respiratory variability, suggesting right atrial pressure of 3 mmHg. IAS/Shunts: No atrial level shunt detected by color flow Doppler.  Additional Comments: A device lead is visualized.  LEFT VENTRICLE PLAX 2D LVIDd:         4.60 cm LVIDs:         3.30 cm LV PW:         1.20 cm LV IVS:        1.20 cm LVOT diam:     1.90 cm LV SV:         59 LV SV Index:   27 LVOT Area:     2.84 cm  RIGHT VENTRICLE RV Basal diam:  4.60 cm RV Mid diam:    3.60 cm RV S prime:     17.30 cm/s TAPSE (M-mode): 1.7 cm LEFT ATRIUM              Index        RIGHT ATRIUM           Index LA diam:  4.80 cm  2.19 cm/m   RA Area:     28.80 cm LA Vol (A2C):   124.0 ml 56.64 ml/m  RA Volume:   97.90 ml  44.72 ml/m LA Vol (A4C):   81.5 ml  37.23 ml/m LA Biplane Vol: 123.0 ml 56.18 ml/m  AORTIC VALVE                     PULMONIC VALVE AV Area (Vmax):    1.79 cm      PV Vmax:       1.46 m/s AV Area (Vmean):   1.76 cm      PV Peak grad:  8.6 mmHg AV Area (VTI):     1.66 cm AV Vmax:           187.00 cm/s AV Vmean:          129.000 cm/s AV VTI:            0.353 m AV Peak Grad:      14.0 mmHg AV Mean Grad:      8.0 mmHg LVOT Vmax:         118.00 cm/s LVOT Vmean:        79.900 cm/s LVOT VTI:          0.207 m LVOT/AV VTI ratio: 0.59  AORTA Ao Root diam: 3.10 cm Ao Asc diam:  3.80 cm MITRAL VALVE                TRICUSPID VALVE MV Area (PHT): 3.60 cm     TR Peak grad:   39.7 mmHg MV Decel Time: 211 msec     TR Vmax:        315.00 cm/s MV E velocity: 116.00 cm/s MV A velocity: 29.00 cm/s   SHUNTS MV E/A ratio:  4.00         Systemic VTI:  0.21 m                             Systemic Diam: 1.90 cm Carolan Clines Electronically signed by Carolan Clines Signature Date/Time: 07/09/2023/12:01:57 PM    Final    DG Shoulder Right Port  Result Date: 07/09/2023 CLINICAL DATA:  Pain from fall. EXAM: RIGHT SHOULDER - 1 VIEW COMPARISON:  Right shoulder radiographs 01/02/2023 FINDINGS: Moderate glenohumeral joint space narrowing with mild inferior glenoid degenerative osteophytes, similar to prior. Moderate acromioclavicular joint space narrowing and inferior greater than superior osteophytes,  similar to prior. Mild distal lateral broad-based subacromial spurring. No acute fracture or dislocation. IMPRESSION: Moderate glenohumeral and acromioclavicular osteoarthritis, similar to prior. Electronically Signed   By: Neita Garnet M.D.   On: 07/09/2023 10:46   DG Knee Right Port  Result Date: 07/09/2023 CLINICAL DATA:  Pain from fall. EXAM: PORTABLE RIGHT KNEE - 1-2 VIEW COMPARISON:  None Available. FINDINGS: Mildly decreased bone mineralization. Mild-to-moderate medial compartment joint space narrowing with minimal peripheral osteophytosis. Small joint effusion. Mild superior and minimal inferior patellar degenerative spurring. Minimal chronic enthesopathic change at the quadriceps insertion on the patella. There is calcification measuring approximately 1.5 cm in length overlying the proximal medial collateral ligament, greatest at the mid height of the medial femoral condyle. This likely reflects the sequela of remote injury to the medial collateral ligament. No acute fracture is seen.  No dislocation. IMPRESSION: 1. No acute fracture. 2. Mild-to-moderate medial compartment osteoarthritis. 3. Small joint effusion. 4. Calcification overlying the proximal medial collateral ligament, likely  the sequela of remote medial collateral ligament injury. Electronically Signed   By: Neita Garnet M.D.   On: 07/09/2023 10:44   CT HEAD CODE STROKE WO CONTRAST  Addendum Date: 07/08/2023   ADDENDUM REPORT: 07/08/2023 11:36 ADDENDUM: Findings were discussed with Dr. Viviann Spare on 07/08/23 at 11:22 AM. Electronically Signed   By: Lorenza Cambridge M.D.   On: 07/08/2023 11:36   Result Date: 07/08/2023 CLINICAL DATA:  Code stroke.  Neuro deficit, acute, stroke suspected EXAM: CT HEAD WITHOUT CONTRAST TECHNIQUE: Contiguous axial images were obtained from the base of the skull through the vertex without intravenous contrast. RADIATION DOSE REDUCTION: This exam was performed according to the departmental dose-optimization program  which includes automated exposure control, adjustment of the mA and/or kV according to patient size and/or use of iterative reconstruction technique. COMPARISON:  CT head 01/08/23 FINDINGS: Brain: There is a nonspecific rounded hyperdensity in the left thalamus measuring 5 mm, which could represent chronic left thalamic hemorrhage. Age indeterminate infarcts in the left basal ganglia. No hydrocephalus. No extra-axial fluid collection. Redemonstrated chronic left MCA territory infarct unchanged compared to prior exam. There is also a chronic right frontal lobe infarct, unchanged. No CT evidence of a new cortical infarct. There is a chronic right thalamic infarct Vascular: No hyperdense vessel or unexpected calcification. Skull: Normal. Negative for fracture or focal lesion. Sinuses/Orbits: No middle ear or mastoid effusion. Paranasal sinuses are clear. Orbits are unremarkable. Other: None. ASPECTS [facility] Stroke Program Early CT Score): No localizing sign provided. IMPRESSION: 1. Nonspecific rounded hyperdensity in the left thalamus measuring 5 mm, which could represent chronic left thalamic hemorrhage. 2. Age indeterminate infarcts in the left basal ganglia. 3. Redemonstrated chronic left MCA territory infarct and chronic right frontal lobe infarct. Electronically Signed: By: Lorenza Cambridge M.D. On: 07/08/2023 11:11   CT ANGIO HEAD NECK W WO CM W PERF (CODE STROKE)  Result Date: 07/08/2023 CLINICAL DATA:  Neuro deficit, acute, stroke suspected EXAM: CT ANGIOGRAPHY HEAD AND NECK CT PERFUSION BRAIN TECHNIQUE: Multidetector CT imaging of the head and neck was performed using the standard protocol during bolus administration of intravenous contrast. Multiplanar CT image reconstructions and MIPs were obtained to evaluate the vascular anatomy. Carotid stenosis measurements (when applicable) are obtained utilizing NASCET criteria, using the distal internal carotid diameter as the denominator. Multiphase CT imaging of the  brain was performed following IV bolus contrast injection. Subsequent parametric perfusion maps were calculated using RAPID software. RADIATION DOSE REDUCTION: This exam was performed according to the departmental dose-optimization program which includes automated exposure control, adjustment of the mA and/or kV according to patient size and/or use of iterative reconstruction technique. CONTRAST:  Iodinated contrast was used to improve disease detection. COMPARISON:  CTA head 01/31/23 FINDINGS: See same day CT head for intracranial findings. CTA NECK FINDINGS Aortic arch: Standard branching. Imaged portion shows no evidence of aneurysm or dissection. No significant stenosis of the major arch vessel origins. Right carotid system: No evidence of dissection, stenosis (50% or greater) or occlusion. 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 This is new compared to 01/31/2023 CTA of the head. Vertebral arteries: Codominant. No evidence of dissection, stenosis (50% or greater) or occlusion. 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 Other neck: Negative Upper chest: Negative Review of the MIP images confirms the above findings CTA HEAD FINDINGS Anterior circulation: Thrombus extends to the level of the ICA terminus (series 8, image 158). The left MCA territory otherwise appears normally contrast opacify. Right MCA territory is  normal in appearance. Posterior circulation: Moderate narrowing in the mid P2 segment of the right PCA (series 8, image 148). No proximal occlusion, aneurysm, or vascular malformation. Venous sinuses: As permitted by contrast timing, patent. Anatomic variants: None Review of the MIP images confirms the above findings CT Brain Perfusion Findings: ASPECTS: 9 CBF (<30%) Volume: 18mL Perfusion (Tmax>6.0s) volume: 34mL Mismatch Volume: 16mL Infarction Location:Left MCA territory. Patient has a known chronic left MCA territory infarct. There is perfusion abnormality in the region of the left basal  ganglia, which are likely accurate given findings on same day CTA. IMPRESSION: 1. Left ICA is occluded at the origin. This is new compared to 01/31/2023 CTA of the head. Thrombus extends to the level of the ICA terminus. The left MCA territory otherwise appears normally contrast opacify. 2. Moderate narrowing in the mid P2 segment of the right PCA. 3. Perfusion abnormality in the region of the left basal ganglia is likely accurate given findings on same day non-contrast enhanced CT. 4. See same day CT head for intracranial findings. Findings were discussed with Dr. Viviann Spare on 07/08/23 at 11:22 AM. Electronically Signed   By: Lorenza Cambridge M.D.   On: 07/08/2023 11:36    Labs:  CBC: Recent Labs    07/08/23 1052 07/08/23 1104  WBC 8.8  --   HGB 17.7* 17.7*  HCT 51.7 52.0  PLT 154  --     COAGS: Recent Labs    07/08/23 1534  INR 1.2  APTT 32    BMP: Recent Labs    07/08/23 1052 07/08/23 1104  NA 137 138  K 4.3 4.3  CL 103 105  CO2 19*  --   GLUCOSE 102* 100*  BUN 22 26*  CALCIUM 9.5  --   CREATININE 1.12 1.00  GFRNONAA >60  --     LIVER FUNCTION TESTS: No results for input(s): "BILITOT", "AST", "ALT", "ALKPHOS", "PROT", "ALBUMIN" in the last 8760 hours.  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 NIR to sign off at this time. Please contact NIR team with any questions or concerns.  Electronically Signed: Kennieth Francois, PA-C 07/09/2023, 4:34 PM   I spent a total of 15 Minutes at the the patient's bedside AND on the patient's hospital floor or unit,  greater than 50% of which was counseling/coordinating care for left ICA occlusion.

## 2023-07-09 NOTE — TOC CAGE-AID Note (Signed)
Transition of Care [facility]) - CAGE-AID Screening   Patient Details  Name: Cory Brown MRN: [id number] Date of Birth: [birth year]  [facility] CM/SW Contact:    Mearl Latin, LCSW Phone Number: 07/09/2023, 10:21 AM   Clinical Narrative: Patient unable to participate in screening at this time due to disorientation.    CAGE-AID Screening: Substance Abuse Screening unable to be completed due to: : Patient unable to participate

## 2023-07-09 NOTE — Evaluation (Signed)
Physical Therapy Evaluation Patient Details Name: Cory Brown MRN: [id number] DOB: 04/17/1951 Today's Date: 07/09/2023  History of Present Illness  Patient is a 72 yo male presenting to the ED with R sided weakness, slurred speech and facial droop on 07/08/23. 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 PMH - afib, DM, HTN, MI, CVA   Clinical Impression  Pt admitted with above diagnosis. PTA pt lived at home alone, independent and driving. Pt currently with functional limitations due to the deficits listed below (see PT Problem List). On eval, he required mod assist bed mobility, +2 mod assist sit to stand, and +2 HH/mod assist sidestepping bedside. Pt will benefit from acute skilled PT to increase their independence and safety with mobility to allow discharge.   Recommend further therapy in inpatient setting, > 3 hours/day.       If plan is discharge home, recommend the following:     Can travel by private vehicle        Equipment Recommendations Other (comment) (TBD)  Recommendations for Other Services  Rehab consult    Functional Status Assessment Patient has had a recent decline in their functional status and demonstrates the ability to make significant improvements in function in a reasonable and predictable amount of time.     Precautions / Restrictions Precautions Precautions: Fall;Other (comment) Precaution Comments: a line L wrist      Mobility  Bed Mobility Overal bed mobility: Needs Assistance Bed Mobility: Supine to Sit, Sit to Supine     Supine to sit: Mod assist, Used rails Sit to supine: Mod assist, Used rails   General bed mobility comments: cues for sequencing, assist with RLE and trunk. Increased assist due to limiting WB thru L wrist (a line).    Transfers Overall transfer level: Needs assistance Equipment used: 2 person hand held assist Transfers: Sit to/from Stand Sit to Stand: Mod assist           General transfer comment:  assist to power up and stabilize balance, increased time    Ambulation/Gait Ambulation/Gait assistance: +2 physical assistance, Mod assist   Assistive device: 2 person hand held assist Gait Pattern/deviations: Antalgic       General Gait Details: very unsteady, antalgic. Unable to safely progress forward gait. Able to take 2-3 shuffle sidesteps bedside. Would benefit from RW after a line is d/c'd.  Stairs            Wheelchair Mobility     Tilt Bed    Modified Rankin (Stroke Patients Only) Modified Rankin (Stroke Patients Only) Pre-Morbid Rankin Score: No symptoms Modified Rankin: Moderately severe disability     Balance Overall balance assessment: Needs assistance Sitting-balance support: Feet supported, Single extremity supported Sitting balance-Leahy Scale: Good     Standing balance support: Bilateral upper extremity supported, During functional activity Standing balance-Leahy Scale: Poor                               Pertinent Vitals/Pain Pain Assessment Pain Assessment: Faces Faces Pain Scale: Hurts even more Pain Location: R shoulder, R knee Pain Descriptors / Indicators: Grimacing, Guarding, Discomfort Pain Intervention(s): Monitored during session, Limited activity within patient's tolerance    Home Living Family/patient expects to be discharged to:: Private residence Living Arrangements: Alone Available Help at Discharge: Family (son) Type of Home: House Home Access: Stairs to enter   Secretary/administrator of Steps: 6   Home Layout: One level  Additional Comments: unsure of accuracy due to aphasia. Info taken from pt and chart (previous admission 11/2021). 11/2021 admission was for CVA. He d/c'd home then, I/mod I without any needs for follow up PT.    Prior Function Prior Level of Function : Independent/Modified Independent;Driving             Mobility Comments: no use of AD       Extremity/Trunk Assessment   Upper  Extremity Assessment Upper Extremity Assessment: Defer to OT evaluation    Lower Extremity Assessment Lower Extremity Assessment: RLE deficits/detail RLE Deficits / Details: 3/5, Pt with c/o R knee pain, with ROM, WB, and palpation. Noted bruising at patella. RLE: Unable to fully assess due to pain    Cervical / Trunk Assessment Cervical / Trunk Assessment: Normal  Communication   Communication Communication: Difficulty following commands/understanding;Difficulty communicating thoughts/reduced clarity of speech Following commands: Follows one step commands consistently Cueing Techniques: Verbal cues;Tactile cues  Cognition Arousal: Alert Behavior During Therapy: WFL for tasks assessed/performed Overall Cognitive Status: Impaired/Different from baseline Area of Impairment: Orientation, Attention, Memory, Following commands, Safety/judgement, Awareness, Problem solving                 Orientation Level: Disoriented to, Time, Situation, Place Current Attention Level: Sustained Memory: Decreased short-term memory Following Commands: Follows one step commands consistently Safety/Judgement: Decreased awareness of deficits Awareness: Emergent Problem Solving: Slow processing, Difficulty sequencing, Requires verbal cues          General Comments General comments (skin integrity, edema, etc.): VSS on RA    Exercises     Assessment/Plan    PT Assessment Patient needs continued PT services  PT Problem List Decreased strength;Decreased balance;Decreased cognition;Decreased knowledge of precautions;Pain;Decreased mobility;Decreased knowledge of use of DME;Decreased activity tolerance;Decreased safety awareness       PT Treatment Interventions DME instruction;Functional mobility training;Balance training;Patient/family education;Therapeutic activities;Gait training;Therapeutic exercise;Stair training;Cognitive remediation    PT Goals (Current goals can be found in the Care  Plan section)  Acute Rehab PT Goals Patient Stated Goal: not stated PT Goal Formulation: With patient Time For Goal Achievement: 07/23/23 Potential to Achieve Goals: Good    Frequency Min 1X/week     Co-evaluation PT/OT/SLP Co-Evaluation/Treatment: Yes Reason for Co-Treatment: Necessary to address cognition/behavior during functional activity;For patient/therapist safety;To address functional/ADL transfers PT goals addressed during session: Mobility/safety with mobility;Balance         AM-PAC PT "6 Clicks" Mobility  Outcome Measure Help needed turning from your back to your side while in a flat bed without using bedrails?: A Little Help needed moving from lying on your back to sitting on the side of a flat bed without using bedrails?: A Lot Help needed moving to and from a bed to a chair (including a wheelchair)?: A Lot Help needed standing up from a chair using your arms (e.g., wheelchair or bedside chair)?: A Lot Help needed to walk in hospital room?: Total Help needed climbing 3-5 steps with a railing? : Total 6 Click Score: 11    End of Session Equipment Utilized During Treatment: Gait belt Activity Tolerance: Patient tolerated treatment well Patient left: in bed;with call bell/phone within reach;with bed alarm set Nurse Communication: Mobility status PT Visit Diagnosis: Unsteadiness on feet (R26.81);Other abnormalities of gait and mobility (R26.89);Pain Pain - Right/Left: Right Pain - part of body: Knee    Time: 1829-9371 PT Time Calculation (min) (ACUTE ONLY): 40 min   Charges:   PT Evaluation $PT Eval Moderate Complexity: 1 Mod  PT General Charges $$ ACUTE PT VISIT: 1 Visit         Ferd Glassing., PT  Office # [phone]   Ilda Foil 07/09/2023, 12:22 PM

## 2023-07-09 NOTE — Progress Notes (Addendum)
STROKE TEAM PROGRESS NOTE   BRIEF HPI [name] is a 72 y.o. 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 He was found on the ground by a neighbor today and his son went to see him and saw that he was weaker on the right side with a right facial droop slurred speech and a "1000 yard stare". At baseline it is reported that he has recovered well from his stroke and is currently ambulating independently but in physical therapy for minor right leg weakness. On exam, had r-sided weakness, slurred speech, r facial droop. LKW 2130 9/7. NIH 12.  Previous M1 occlusion with thrombectomy in 2023, etiology thought to be d/t Afib, noncompliance with Pradaxa. Unknown current compliance.    SIGNIFICANT HOSPITAL EVENTS 9/8: CTA shows Left ICA occlusion  Taken for mech thrombectomy, TICI 3 achieved.   INTERIM HISTORY/SUBJECTIVE No family at bedside, RN at bedside. Physical therapy at bedside.  Patient sitting on edge of bed. Pending follow-up MRI/MRA.  On exam, patient does have right field cut, right facial droop, decreased fine motor movements on the right hand, word finding difficulties, decreased movement of the right side, decreased sensation on the right side. Passed swallow, home meds added back. Wean cleviprex as able.  Labs ordered for tomorrow AM, likely transfer out of ICU tomorrow AM.   OBJECTIVE  CBC    Component Value Date/Time   WBC 8.8 07/08/2023 1052   RBC 5.45 07/08/2023 1052   HGB 17.7 (H) 07/08/2023 1104   HCT 52.0 07/08/2023 1104   PLT 154 07/08/2023 1052   MCV 94.9 07/08/2023 1052   MCH 32.5 07/08/2023 1052   MCHC 34.2 07/08/2023 1052   RDW 13.0 07/08/2023 1052   LYMPHSABS 0.9 07/08/2023 1052   MONOABS 0.9 07/08/2023 1052   EOSABS 0.0 07/08/2023 1052   BASOSABS 0.1 07/08/2023 1052    BMET    Component Value Date/Time   NA 138 07/08/2023  1104   K 4.3 07/08/2023 1104   CL 105 07/08/2023 1104   CO2 19 (L) 07/08/2023 1052   GLUCOSE 100 (H) 07/08/2023 1104   BUN 26 (H) 07/08/2023 1104   CREATININE 1.00 07/08/2023 1104   CALCIUM 9.5 07/08/2023 1052   GFRNONAA >60 07/08/2023 1052    IMAGING past 24 hours ECHOCARDIOGRAM COMPLETE  Result Date: 07/09/2023    ECHOCARDIOGRAM REPORT   Patient Name:   Cory Brown Date of Exam: 07/09/2023 Medical Rec #:  [id number]     Height:       73.0 in Accession #:    [id number]    Weight:       208.3 lb Date of Birth:  05-Feb-1951     BSA:          2.189 m Patient Age:    72 years      BP:           137/63 mmHg Patient Gender: M             HR:           71 bpm. Exam Location:  Inpatient Procedure: 2D Echo, Cardiac Doppler and Color Doppler Indications:    Stroke I63.9  History:        Patient has prior history of Echocardiogram examinations, most  recent 12/06/2021. Stroke; Risk Factors:Diabetes, Hypertension,                 Dyslipidemia and Non-Smoker.  Sonographer:    Dondra Prader RVT RCS Referring Phys: [id number] [facility]  Sonographer Comments: Suboptimal subcostal window. IMPRESSIONS  1. Left ventricular ejection fraction, by estimation, is 60 to 65%. The left ventricle has normal function. The left ventricle has no regional wall motion abnormalities. There is mild concentric left ventricular hypertrophy. Left ventricular diastolic parameters are indeterminate.  2. Right ventricular systolic function is normal. The right ventricular size is normal. There is mildly elevated pulmonary artery systolic pressure.  3. Left atrial size was severely dilated.  4. Right atrial size was severely dilated.  5. The mitral valve is grossly normal. Trivial mitral valve regurgitation.  6. Tricuspid valve regurgitation is mild to moderate.  7. The aortic valve is tricuspid. There is mild calcification of the aortic valve. Aortic valve regurgitation is not visualized.  8. The inferior vena cava is normal in size  with greater than 50% respiratory variability, suggesting right atrial pressure of 3 mmHg. Conclusion(s)/Recommendation(s): No intracardiac source of embolism detected on this transthoracic study. Consider a transesophageal echocardiogram to exclude cardiac source of embolism if clinically indicated. FINDINGS  Left Ventricle: Left ventricular ejection fraction, by estimation, is 60 to 65%. The left ventricle has normal function. The left ventricle has no regional wall motion abnormalities. The left ventricular internal cavity size was normal in size. There is  mild concentric left ventricular hypertrophy. Left ventricular diastolic parameters are indeterminate. Right Ventricle: The right ventricular size is normal. Right ventricular systolic function is normal. There is mildly elevated pulmonary artery systolic pressure. The tricuspid regurgitant velocity is 3.15 m/s, and with an assumed right atrial pressure of 3 mmHg, the estimated right ventricular systolic pressure is 42.7 mmHg. Left Atrium: Left atrial size was severely dilated. Right Atrium: Right atrial size was severely dilated. Pericardium: There is no evidence of pericardial effusion. Mitral Valve: The mitral valve is grossly normal. Trivial mitral valve regurgitation. Tricuspid Valve: The tricuspid valve is normal in structure. Tricuspid valve regurgitation is mild to moderate. Aortic Valve: The aortic valve is tricuspid. There is mild calcification of the aortic valve. Aortic valve regurgitation is not visualized. Aortic valve mean gradient measures 8.0 mmHg. Aortic valve peak gradient measures 14.0 mmHg. Aortic valve area, by  VTI measures 1.66 cm. Pulmonic Valve: Pulmonic valve regurgitation is not visualized. Aorta: The aortic root and ascending aorta are structurally normal, with no evidence of dilitation. Venous: The inferior vena cava is normal in size with greater than 50% respiratory variability, suggesting right atrial pressure of 3 mmHg.  IAS/Shunts: No atrial level shunt detected by color flow Doppler. Additional Comments: A device lead is visualized.  LEFT VENTRICLE PLAX 2D LVIDd:         4.60 cm LVIDs:         3.30 cm LV PW:         1.20 cm LV IVS:        1.20 cm LVOT diam:     1.90 cm LV SV:         59 LV SV Index:   27 LVOT Area:     2.84 cm  RIGHT VENTRICLE RV Basal diam:  4.60 cm RV Mid diam:    3.60 cm RV S prime:     17.30 cm/s TAPSE (M-mode): 1.7 cm LEFT ATRIUM  Index        RIGHT ATRIUM           Index LA diam:        4.80 cm  2.19 cm/m   RA Area:     28.80 cm LA Vol (A2C):   124.0 ml 56.64 ml/m  RA Volume:   97.90 ml  44.72 ml/m LA Vol (A4C):   81.5 ml  37.23 ml/m LA Biplane Vol: 123.0 ml 56.18 ml/m  AORTIC VALVE                     PULMONIC VALVE AV Area (Vmax):    1.79 cm      PV Vmax:       1.46 m/s AV Area (Vmean):   1.76 cm      PV Peak grad:  8.6 mmHg AV Area (VTI):     1.66 cm AV Vmax:           187.00 cm/s AV Vmean:          129.000 cm/s AV VTI:            0.353 m AV Peak Grad:      14.0 mmHg AV Mean Grad:      8.0 mmHg LVOT Vmax:         118.00 cm/s LVOT Vmean:        79.900 cm/s LVOT VTI:          0.207 m LVOT/AV VTI ratio: 0.59  AORTA Ao Root diam: 3.10 cm Ao Asc diam:  3.80 cm MITRAL VALVE                TRICUSPID VALVE MV Area (PHT): 3.60 cm     TR Peak grad:   39.7 mmHg MV Decel Time: 211 msec     TR Vmax:        315.00 cm/s MV E velocity: 116.00 cm/s MV A velocity: 29.00 cm/s   SHUNTS MV E/A ratio:  4.00         Systemic VTI:  0.21 m                             Systemic Diam: 1.90 cm Carolan Clines Electronically signed by Carolan Clines Signature Date/Time: 07/09/2023/12:01:57 PM    Final    DG Shoulder Right Port  Result Date: 07/09/2023 CLINICAL DATA:  Pain from fall. EXAM: RIGHT SHOULDER - 1 VIEW COMPARISON:  Right shoulder radiographs 01/02/2023 FINDINGS: Moderate glenohumeral joint space narrowing with mild inferior glenoid degenerative osteophytes, similar to prior. Moderate acromioclavicular joint  space narrowing and inferior greater than superior osteophytes, similar to prior. Mild distal lateral broad-based subacromial spurring. No acute fracture or dislocation. IMPRESSION: Moderate glenohumeral and acromioclavicular osteoarthritis, similar to prior. Electronically Signed   By: Neita Garnet M.D.   On: 07/09/2023 10:46   DG Knee Right Port  Result Date: 07/09/2023 CLINICAL DATA:  Pain from fall. EXAM: PORTABLE RIGHT KNEE - 1-2 VIEW COMPARISON:  None Available. FINDINGS: Mildly decreased bone mineralization. Mild-to-moderate medial compartment joint space narrowing with minimal peripheral osteophytosis. Small joint effusion. Mild superior and minimal inferior patellar degenerative spurring. Minimal chronic enthesopathic change at the quadriceps insertion on the patella. There is calcification measuring approximately 1.5 cm in length overlying the proximal medial collateral ligament, greatest at the mid height of the medial femoral condyle. This likely reflects the sequela of remote injury to the medial collateral ligament. No  acute fracture is seen.  No dislocation. IMPRESSION: 1. No acute fracture. 2. Mild-to-moderate medial compartment osteoarthritis. 3. Small joint effusion. 4. Calcification overlying the proximal medial collateral ligament, likely the sequela of remote medial collateral ligament injury. Electronically Signed   By: Neita Garnet M.D.   On: 07/09/2023 10:44    Vitals:   07/09/23 1000 07/09/23 1015 07/09/23 1030 07/09/23 1200  BP: 132/63  (!) 130/59   Pulse: 70 71 69 83  Resp: (!) 24 (!) 25 (!) 21 (!) 23  Temp:    99 F (37.2 C)  TempSrc:      SpO2: 97% 97% 95% 97%  Weight:         PHYSICAL EXAM General:  Alert, well-nourished, well-developed patient in no acute distress Psych:  Mood and affect appropriate for situation CV: Regular rate and rhythm on monitor Respiratory:  Regular, unlabored respirations on room air GI: Abdomen soft and nontender   NEURO:  Mental  Status: AA&Ox2, follows commands.  Speech/Language: Slight dysarthria and Word-finding difficulties present. Decreased comprehension.   Cranial Nerves:  II: PERRL. Right visual field cut.  III, IV, VI: EOMI. Eyelids elevate symmetrically.  V: Sensation is intact to light touch and symmetrical to face.  VII: Right facial droop VIII: hearing intact to voice. IX, X: Palate elevates symmetrically. Phonation is normal.  WU:[id number] shrug 5/5. XII: tongue is midline without fasciculations. Motor: 5/5 strength to all muscle groups tested.  Tone: is normal and bulk is normal Sensation- Decreased to right side.  Coordination: FTN, fine motor decreased on right. .No drift.  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  Thrombus extends to the level of the ICA terminus Moderate right mid P2 narrowing Perfusion mismatch of 16 mL with perfusion abnormality in the region of the left basal ganglia Post IR CT: No hemorrhage  MRI: pending MRA  pending  2D Echo: EF 60 to 65%, mild LVH, elevated pulmonary artery systolic pressure, severely dilated left atria, severely dilated right atria, trivial MVR mild calcification of the aortic valve.   LDL 39 HgbA1c 7.1 VTE prophylaxis - SCds Pradaxa (dabigatran) twice a day prior to admission, now on No antithrombotic pending follow-up MRI/MRA. (Unknown current compliance of pradaxa.) Therapy recommendations:  CIR Disposition:  pending  Hx of Stroke  2010 S/p Left MCA stroke s/p thrombectomy 11/2021 Etiology thought to be d/t afib, not compliant with pradaxa 2023 CT showed multiple remote infarcts in bilateral frontal lobes Discharged on pradaxa  Atrial fibrillation Home Meds: pradaxa, metoprolol Patient previously stopped Pradaxa 07/2021 by himself, non-compliant history. Managed by [facility], previously [facility] Continue telemetry monitoring Begin anticoagulation with pradaxa once follow-up MRI is stable.    Hypertension CAD s/p stent x2 S/p MI  Home meds:  Toprol-XL 100mg  Stable Blood Pressure Goal: SBP 120-160 for first 24 hours then less than 180   Hyperlipidemia Home meds:  Crestor 20mg , resumed in hospital LDL 39, goal < 70 Continue statin at discharge  Diabetes type II Uncontrolled Home meds:  jardiance 25mg , glimepride 2mg , metformin 500mg  HgbA1c 7.1, goal < 7.0  CBGs SSI Recommend close follow-up with PCP for better DM control  Other Stroke Risk Factors Family hx stroke (mother)  Other Active Problems Essential Tremor Saw Dr. Marjory Lies at [facility] Was previously on Primidone 50mg  BID Diabetic nerve pain  Gabapentin 300mg  HS, restarted  Hospital day # 1   Pt seen by Neuro NP/APP and later by MD. Note/plan to be edited by MD as needed.    Lynnae January, DNP, AGACNP-BC [facility] Please use AMION for contact information & EPIC for messaging.  STROKE MD NOTE :  I have personally obtained history,examined this patient, reviewed notes, independently viewed imaging studies, participated in medical decision making and plan of care.ROS completed by me personally and pertinent positives fully documented  I have made any additions or clarifications directly to the above note. Agree with note above.  Patient presented with aphasia and right hemiparesis due to left cavernous ICA occlusion and underwent successful mechanical thrombectomy with complete revascularization.  His neurological  exam looks good except close slight right peripheral visual field defect and facial droop.  Patient states he was compliant with Pradaxa for his A-fib.  Recommend close neurological observation and strict blood pressure control as per post thrombectomy protocol.  Check MRI scan and if no hemorrhage resume anticoagulation with Pradaxa.  Mobilize out of bed.  Therapy consults.  Discussed with patient and family and answered questions.  Discussed with Dr. [name] neurointerventional radiology. This patient is critically ill and at significant risk of neurological worsening, death and care requires constant monitoring of vital signs, hemodynamics,respiratory and cardiac monitoring, extensive review of multiple databases, frequent neurological assessment, discussion with family, other specialists and medical decision making of high complexity.I have made any additions or clarifications directly to the above note.This critical care time does not reflect procedure time, or teaching time or supervisory time of PA/NP/Med Resident etc but could involve care discussion time.  I spent 30 minutes of neurocritical care time  in the care of  this patient.     Delia Heady, MD Medical Director [facility] Pager: [phone] 07/09/2023 5:34 PM  To contact Stroke Continuity provider, please refer to [email or web address]. After hours, contact General Neurology

## 2023-07-09 NOTE — Progress Notes (Signed)
*  PRELIMINARY RESULTS* Echocardiogram 2D Echocardiogram has been performed.  Cory Brown 07/09/2023, 11:18 AM

## 2023-07-09 NOTE — Progress Notes (Signed)
Patient went down with RN to MRI, pt had no events during transport or study.

## 2023-07-09 NOTE — Progress Notes (Signed)
Inpatient Rehab Admissions Coordinator:  ? ?Per therapy recommendations,  patient was screened for CIR candidacy by Laura Staley, MS, CCC-SLP. At this time, Pt. Appears to be a a potential candidate for CIR. I will place   order for rehab consult per protocol for full assessment. Please contact me any with questions. ? ?Laura Staley, MS, CCC-SLP ?Rehab Admissions Coordinator  ?[phone] (celll) ?[phone] (office) ? ?

## 2023-07-09 NOTE — Anesthesia Postprocedure Evaluation (Signed)
Anesthesia Post Note  Patient: Cory Brown  Procedure(s) Performed: IR WITH ANESTHESIA     Patient location during evaluation: PACU Anesthesia Type: General Level of consciousness: awake and alert Pain management: pain level controlled Vital Signs Assessment: post-procedure vital signs reviewed and stable Respiratory status: spontaneous breathing, nonlabored ventilation, respiratory function stable and patient connected to nasal cannula oxygen Cardiovascular status: blood pressure returned to baseline and stable Postop Assessment: no apparent nausea or vomiting Anesthetic complications: no   No notable events documented.  Last Vitals:  Vitals:   07/08/23 2130 07/09/23 0000  BP: 115/60   Pulse: 70   Resp: 20   Temp:  (!) 36.3 C  SpO2: 94%     Last Pain:  Vitals:   07/09/23 0000  TempSrc: Axillary  PainSc:                  Collene Schlichter

## 2023-07-09 NOTE — Evaluation (Signed)
Occupational Therapy Evaluation Patient Details Name: Cory Brown MRN: [id number] DOB: October 05, 1951 Today's Date: 07/09/2023   History of Present Illness Patient is a 72 yo male presenting to the ED with R sided weakness, slurred speech and facial droop on 07/08/23. 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 PMH - afib, DM, HTN, MI, CVA   Clinical Impression   Prior to this admission, patient living alone, driving, and independent in his ADLs. OT unsure of accuracy of report, especially with patient being able to independently manage his medications. Currently, patient presenting with R sided sensation differences in comparison to L, tremors in RUE, decreased cognition and expressive difficulties, potential R sided visual impairment, and need for increased assist in order to complete ADLs and functional mobility. Patient will need further cognitive assessment and visual assessment as patient demonstrated decreased attention throughout evaluation. Patient mod A for ADLs, and mod A of 2 for functional mobility, though limited because of A line. OT recommending high intensity level rehab at discharge, OT will continue to follow.       If plan is discharge home, recommend the following: Two people to help with walking and/or transfers;A lot of help with bathing/dressing/bathroom;Assistance with cooking/housework;Direct supervision/assist for medications management;Direct supervision/assist for financial management;Assist for transportation;Help with stairs or ramp for entrance;Supervision due to cognitive status    Functional Status Assessment  Patient has had a recent decline in their functional status and demonstrates the ability to make significant improvements in function in a reasonable and predictable amount of time.  Equipment Recommendations  Other (comment) (defer to next venue)    Recommendations for Other Services       Precautions / Restrictions Precautions Precautions:  Fall;Other (comment) Precaution Comments: a line L wrist      Mobility Bed Mobility Overal bed mobility: Needs Assistance Bed Mobility: Supine to Sit, Sit to Supine     Supine to sit: Mod assist, Used rails Sit to supine: Mod assist, Used rails   General bed mobility comments: cues for sequencing, assist with RLE and trunk. Increased assist due to limiting WB thru L wrist (a line).    Transfers Overall transfer level: Needs assistance Equipment used: 2 person hand held assist Transfers: Sit to/from Stand Sit to Stand: Mod assist           General transfer comment: assist to power up and stabilize balance, increased time      Balance Overall balance assessment: Needs assistance Sitting-balance support: Feet supported, Single extremity supported Sitting balance-Leahy Scale: Good     Standing balance support: Bilateral upper extremity supported, During functional activity Standing balance-Leahy Scale: Poor                             ADL either performed or assessed with clinical judgement   ADL Overall ADL's : Needs assistance/impaired Eating/Feeding: Set up;Sitting   Grooming: Set up;Sitting   Upper Body Bathing: Minimal assistance;Sitting   Lower Body Bathing: Moderate assistance;Sit to/from stand;Sitting/lateral leans   Upper Body Dressing : Minimal assistance;Sitting   Lower Body Dressing: Moderate assistance;Maximal assistance;Sitting/lateral leans;Sit to/from stand   Toilet Transfer: Moderate assistance;+2 for safety/equipment;+2 for physical assistance;Stand-pivot;BSC/3in1 Toilet Transfer Details (indicate cue type and reason): simulated Toileting- Clothing Manipulation and Hygiene: Moderate assistance;Sit to/from stand;Sitting/lateral lean       Functional mobility during ADLs: Moderate assistance;+2 for physical assistance;+2 for safety/equipment;Cueing for sequencing General ADL Comments: Patient presenting with R sided sensation  differences  in comparison to L, tremors in RUE, decreased cognition and expressive difficulties, potential R sided visual impairment, and need for increased assist in order to complete ADLs and functional mobility.     Vision Baseline Vision/History: 1 Wears glasses Ability to See in Adequate Light: 0 Adequate Patient Visual Report: No change from baseline Vision Assessment?: Yes Eye Alignment: Within Functional Limits Ocular Range of Motion: Within Functional Limits Alignment/Gaze Preference: Within Defined Limits Tracking/Visual Pursuits: Able to track stimulus in all quads without difficulty Saccades: Within functional limits Convergence: Within functional limits Visual Fields: Impaired-to be further tested in functional context Additional Comments: Potential R sided visual impairment, increased difficulty following commands in order to fully assess     Perception Perception: Impaired Preception Impairment Details: Body Part identification, Inattention/Neglect Perception-Other Comments: Minimal R sided inattention   Praxis Praxis: Impaired Praxis Impairment Details: Motor planning, Organization, Initiation     Pertinent Vitals/Pain Pain Assessment Pain Assessment: Faces Faces Pain Scale: Hurts even more Pain Location: R shoulder, R knee Pain Descriptors / Indicators: Grimacing, Guarding, Discomfort Pain Intervention(s): Limited activity within patient's tolerance, Monitored during session, Repositioned     Extremity/Trunk Assessment Upper Extremity Assessment Upper Extremity Assessment: Generalized weakness;Right hand dominant;RUE deficits/detail RUE Deficits / Details: decreased sensation in R arm, tremulous with all movement RUE Sensation: decreased light touch RUE Coordination: decreased fine motor;decreased gross motor   Lower Extremity Assessment Lower Extremity Assessment: Defer to PT evaluation RLE Deficits / Details: 3/5, Pt with c/o R knee pain, with ROM, WB, and  palpation. Noted bruising at patella. RLE: Unable to fully assess due to pain   Cervical / Trunk Assessment Cervical / Trunk Assessment: Normal   Communication Communication Communication: Difficulty following commands/understanding;Difficulty communicating thoughts/reduced clarity of speech Following commands: Follows one step commands consistently Cueing Techniques: Verbal cues;Tactile cues   Cognition Arousal: Alert Behavior During Therapy: WFL for tasks assessed/performed Overall Cognitive Status: Impaired/Different from baseline Area of Impairment: Orientation, Attention, Memory, Following commands, Safety/judgement, Awareness, Problem solving                 Orientation Level: Disoriented to, Time, Situation, Place Current Attention Level: Sustained Memory: Decreased short-term memory Following Commands: Follows one step commands consistently Safety/Judgement: Decreased awareness of deficits Awareness: Emergent Problem Solving: Slow processing, Difficulty sequencing, Requires verbal cues General Comments: Patient with decreased STM, despite errorless learning techniques patient requires frequent reorientation     General Comments  VSS on RA    Exercises     Shoulder Instructions      Home Living Family/patient expects to be discharged to:: Private residence Living Arrangements: Alone Available Help at Discharge: Family (son) Type of Home: House Home Access: Stairs to enter Secretary/administrator of Steps: 6   Home Layout: One level                   Additional Comments: unsure of accuracy due to aphasia. Info taken from pt and chart (previous admission 11/2021).      Prior Functioning/Environment Prior Level of Function : Independent/Modified Independent;Driving             Mobility Comments: no use of AD ADLs Comments: drove and independent per chart review        OT Problem List: Decreased strength;Decreased range of motion;Decreased  activity tolerance;Impaired balance (sitting and/or standing);Impaired vision/perception;Decreased coordination;Decreased cognition;Decreased safety awareness;Decreased knowledge of use of DME or AE;Decreased knowledge of precautions;Impaired sensation;Pain;Impaired UE functional use      OT Treatment/Interventions: Self-care/ADL training;Therapeutic exercise;Neuromuscular education;Energy  conservation;DME and/or AE instruction;Manual therapy;Therapeutic activities;Cognitive remediation/compensation;Visual/perceptual remediation/compensation;Patient/family education;Balance training    OT Goals(Current goals can be found in the care plan section) Acute Rehab OT Goals Patient Stated Goal: unable to state OT Goal Formulation: Patient unable to participate in goal setting Time For Goal Achievement: 07/23/23 Potential to Achieve Goals: Good ADL Goals Pt Will Perform Lower Body Bathing: with supervision;sit to/from stand;sitting/lateral leans Pt Will Perform Lower Body Dressing: with supervision;sit to/from stand;sitting/lateral leans Pt Will Transfer to Toilet: with supervision;ambulating;regular height toilet Pt Will Perform Toileting - Clothing Manipulation and hygiene: with supervision;sitting/lateral leans;sit to/from stand Additional ADL Goal #1: Patient will be able to follow 2 step commands as a precursor to upper level cognitive tasks to return to prior level of independence.  OT Frequency: Min 1X/week    Co-evaluation PT/OT/SLP Co-Evaluation/Treatment: Yes Reason for Co-Treatment: Necessary to address cognition/behavior during functional activity;For patient/therapist safety;To address functional/ADL transfers PT goals addressed during session: Mobility/safety with mobility;Balance OT goals addressed during session: ADL's and self-care;Strengthening/ROM      AM-PAC OT "6 Clicks" Daily Activity     Outcome Measure Help from another person eating meals?: A Little Help from another  person taking care of personal grooming?: A Little Help from another person toileting, which includes using toliet, bedpan, or urinal?: A Lot Help from another person bathing (including washing, rinsing, drying)?: A Lot Help from another person to put on and taking off regular upper body clothing?: A Little Help from another person to put on and taking off regular lower body clothing?: A Lot 6 Click Score: 15   End of Session Equipment Utilized During Treatment: Gait belt Nurse Communication: Mobility status  Activity Tolerance: Patient tolerated treatment well Patient left: in bed;with call bell/phone within reach;with nursing/sitter in room  OT Visit Diagnosis: Unsteadiness on feet (R26.81);Other abnormalities of gait and mobility (R26.89);Muscle weakness (generalized) (M62.81);Other symptoms and signs involving cognitive function;Other symptoms and signs involving the nervous system (R29.898);Pain Pain - Right/Left: Right Pain - part of body: Knee                Time: 1610-9604 OT Time Calculation (min): 36 min Charges:  OT General Charges $OT Visit: 1 Visit OT Evaluation $OT Eval Moderate Complexity: 1 Mod  Pollyann Glen E. Layonna Dobie, OTR/L Acute Rehabilitation Services [phone]   Cherlyn Cushing 07/09/2023, 4:04 PM

## 2023-07-09 NOTE — Progress Notes (Signed)
SLP Cancellation Note  Patient Details Name: Cory Brown MRN: [id number] DOB: Jan 09, 1951   Cancelled treatment:       Reason Eval/Treat Not Completed: Patient at procedure or test/unavailable. Multiple procedures this am   Haunani Dickard, Riley Nearing 07/09/2023, 10:21 AM

## 2023-07-09 NOTE — Progress Notes (Signed)
Patient could not have MRI due to [employer] pacemake error - one or more of the leads pacing unipolarly. Leads must pace bipolarly for MRI conditionality

## 2023-07-10 DIAGNOSIS — I6300 Cerebral infarction due to thrombosis of unspecified precerebral artery: Secondary | ICD-10-CM | POA: Diagnosis not present

## 2023-07-10 LAB — CBC
HCT: 47.1 % (ref 39.0–52.0)
Hemoglobin: 16.1 g/dL (ref 13.0–17.0)
MCH: 32.5 pg (ref 26.0–34.0)
MCHC: 34.2 g/dL (ref 30.0–36.0)
MCV: 95 fL (ref 80.0–100.0)
Platelets: 154 10*3/uL (ref 150–400)
RBC: 4.96 MIL/uL (ref 4.22–5.81)
RDW: 13.2 % (ref 11.5–15.5)
WBC: 6.6 10*3/uL (ref 4.0–10.5)
nRBC: 0 % (ref 0.0–0.2)

## 2023-07-10 LAB — BASIC METABOLIC PANEL
Anion gap: 13 (ref 5–15)
BUN: 31 mg/dL — ABNORMAL HIGH (ref 8–23)
CO2: 22 mmol/L (ref 22–32)
Calcium: 8.8 mg/dL — ABNORMAL LOW (ref 8.9–10.3)
Chloride: 104 mmol/L (ref 98–111)
Creatinine, Ser: 1.17 mg/dL (ref 0.61–1.24)
GFR, Estimated: >60 mL/min (ref >60)
Glucose, Bld: 98 mg/dL (ref 70–99)
Potassium: 3.7 mmol/L (ref 3.5–5.1)
Sodium: 139 mmol/L (ref 135–145)

## 2023-07-10 MED ORDER — METOPROLOL SUCCINATE ER 100 MG PO TB24
100.0000 mg | ORAL_TABLET | Freq: Every day | ORAL | Status: DC
  Administered 2023-07-11: 100 mg via ORAL
  Filled 2023-07-10: qty 1

## 2023-07-10 NOTE — Progress Notes (Signed)
Arrived to [facility] 3W 6A26 from 4N ICU as a transfer.  Vitals obtained. Placed on cardiac monitoring and second verifier completed. Oriented to room and unit. Call bell use and bed alarm protocol in place.  Left with call bell in reach and bed in lowest position and bed alarm in place.      07/10/23 1200  Vitals  Temp 98.2 F (36.8 C)  Temp Source Oral  BP (!) 178/95  MAP (mmHg) 119  BP Location Left Arm  BP Method Automatic  Patient Position (if appropriate) Lying  Pulse Rate 71  Pulse Rate Source Dinamap  ECG Heart Rate 71  Resp 20  Level of Consciousness  Level of Consciousness Alert  MEWS COLOR  MEWS Score Color Green  Oxygen Therapy  SpO2 99 %  O2 Device Room Air  Pain Assessment  Pain Scale 0-10  Pain Score 0  ECG Monitoring  Telemetry Box Number [id number]  Tele Box Verification Completed by Second Verifier Completed Psychologist, counselling RN)  MEWS Score  MEWS Temp 0  MEWS Systolic 0  MEWS Pulse 0  MEWS RR 0  MEWS LOC 0  MEWS Score 0

## 2023-07-10 NOTE — Progress Notes (Signed)
  Inpatient Rehabilitation Admissions Coordinator   Met with patient at bedside and spoke to his son, Kenslei Hearty Cower, by phone for rehab assessment. We discussed goals and expectations of a possible CIR admit. Jeffren Dombek Cower and his nephew, Brett Canales , can provide 24/7 care after any rehab stay. Alazar Cherian Cower prefers CIR for rehab. I will begin insurance Auth with [facility] for possible CIR admit pending approval. Please call me with any questions.   Ottie Glazier, RN, MSN Rehab Admissions Coordinator [phone]

## 2023-07-10 NOTE — PMR Pre-admission (Signed)
PMR Admission Coordinator Pre-Admission Assessment  Patient: Cory Brown is an 72 y.o., male MRN: [id number] DOB: [birth year] Height: 6\' 1"  (185.4 cm) Weight: 94.5 kg  Insurance Information HMO:     PPO:      PCP:      IPA:      80/20:      OTHER:  PR[facility]: [facility]      Policy#: [id number]      Subscriber: pt CM Name: Cassie      Phone#: [email or web address]     Fax#: [email or web address] or fax [phone] Pre-Cert#: [id number]      Employer:  Benefits:  Phone #: [phone]     Name: 9/10 Eff. Date: 03/26/2018     Deduct: none      Out of Pocket Max: none      Life Max: none CIR: per [facility]      SNF: per [place] Outpatient: per [facility]     Co-Pay:  Home Health: per [place]      Co-Pay:  DME: per [facility]     Co-Pay: per [place] Providers: in network  SECONDARY: [employer]      Policy#: [id number]      Financial Counselor:       Phone#:   The "Data Collection Information Summary" for patients in Inpatient Rehabilitation Facilities with attached "Privacy Act Statement-Health Care Records" was provided and verbally reviewed with: Patient and Family  Emergency Contact Information Contact Information     Name Relation Home Work Mobile   [place] Son [phone]        Other Contacts   None on File    Current Medical History  Patient Admitting Diagnosis: CVA  History of Present Illness: 72 year old male with history of left MCA stroke s/p mechanical thrombectomy, HTN, 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 Found on the ground by a neighbor and noted that he was weaker on his right side and slurred speech.   CTA showed left ICA occlusion. Taken for mechanical thrombectomy, TICI 3 achieved. MRI scattered foci of acute infarct throughout the left MCA and bilateral ACA. MRA with no LVO or high grade stenosis. 2 d echo EF 60 to 65%, Mild LVH, elevated pulmonary artery systolic pressure, severely dilated  left atria, severely dilated right atria, trivial MVR mild calcification of the aortic valve.   Home meds Pradaxa and metoprolol for afib. Resume Pradaxa. LDL 39 on Crestor to continue. Resumed Toprol XL for HTN and CAD. Home meds Jardiance, glimepride and metformin with Hgb A1c 7.1. CBGS and SSI, close f/u with PCP as OP. On Primidone for essential tremor and gabapentin for DM nerve pain.   Complete NIHSS TOTAL: 5  Patient's medical record from [facility] has been reviewed by the rehabilitation admission coordinator and physician.  Past Medical History  Past Medical History:  Diagnosis Date   Acute bronchitis, unspecified 01/16/2022   Acute ischemic stroke (HCC) 12/05/2021   Arthritis    Atrial fibrillation (HCC)    Carpal tunnel syndrome 01/16/2022   Cerebral infarction, unspecified (HCC) 01/16/2022   Cerebrovascular accident [facility]) 01/16/2022   Dec 27, 2021 Entered By: [facility] Comment: Left MCA stroke s/p thrombectomy, 12/05/2021.   Chronic atrial fibrillation (HCC) 09/17/2021   Coronary artery disease 01/16/2022   Dec 27, 2021 Entered By: [facility] D Comment: S/p stent Obtuse marginal 2, 10/2013.   Counseling, unspecified 01/16/2022   Diabetes mellitus without  complication (HCC)    Essential (primary) hypertension 01/16/2022   Generalized weakness 09/17/2021   Hypercholesterolemia    Hyperglycemia due to diabetes mellitus (HCC) 09/17/2021   Hyperlipidemia 01/16/2022   Hypertension    Hyponatremia 09/17/2021   Kidney stones    Low back pain 01/16/2022   MI (myocardial infarction) [facility])    Muscle weakness (generalized) 01/16/2022   Neck pain 01/16/2022   Non-traumatic rhabdomyolysis 09/19/2021   Special screening for malignant neoplasms, colon 11/30/2009   May 05, 2010 Entered By: [facility] D Comment: Normal per pt   Stroke (cerebrum) (HCC) 12/05/2021   Stroke (HCC) 2010   Thrombocytopenia (HCC) 09/18/2021   Traumatic rhabdomyolysis (HCC) 09/17/2021   Tremor    Ulnar  neuropathy 01/16/2022   Has the patient had major surgery during 100 days prior to admission? Yes  Family History   family history includes Cancer in his brother and sister; Diabetes in his brother and father; Hypertension in his mother; Stroke in his mother.  Current Medications  Current Facility-Administered Medications:    acetaminophen (TYLENOL) tablet 650 mg, 650 mg, Oral, Q4H PRN, 650 mg at 07/10/23 0833 **OR** acetaminophen (TYLENOL) 160 MG/5ML solution 650 mg, 650 mg, Per Tube, Q4H PRN **OR** acetaminophen (TYLENOL) suppository 650 mg, 650 mg, Rectal, Q4H PRN, Pamalee Leyden, Devon, NP   Chlorhexidine Gluconate Cloth 2 % PADS 6 each, 6 each, Topical, Daily, Palikh, Jeri Lager, MD, 6 each at 07/10/23 0834   dabigatran (PRADAXA) capsule 150 mg, 150 mg, Oral, Q12H, Palikh, Gaurang M, MD, 150 mg at 07/10/23 0837   gabapentin (NEURONTIN) capsule 100 mg, 100 mg, Oral, Daily, Hetty Blend C, NP, 100 mg at 07/10/23 0834   [START ON 07/11/2023] metoprolol succinate (TOPROL-XL) 24 hr tablet 100 mg, 100 mg, Oral, Daily, Estill Batten W, RPH   polyvinyl alcohol (LIQUIFILM TEARS) 1.4 % ophthalmic solution 1 drop, 1 drop, Both Eyes, PRN, Pearlean Brownie, Pramod S, MD   rosuvastatin (CRESTOR) tablet 10 mg, 10 mg, Oral, Daily, Richardo Priest, Erin C, NP, 10 mg at 07/10/23 1610   senna-docusate (Senokot-S) tablet 1 tablet, 1 tablet, Oral, QHS PRN, Elmer Picker, NP  Patients Current Diet:  Diet Order             Diet heart healthy/carb modified Room service appropriate? Yes with Assist; Fluid consistency: Thin  Diet effective now                  Precautions / Restrictions Precautions Precautions: Fall, Other (comment) Precaution Comments: a line L wrist Restrictions Weight Bearing Restrictions: No   Has the patient had 2 or more falls or a fall with injury in the past year? No  Prior Activity Level Community (5-7x/wk): lived alone, independent, driving  Prior Functional Level Self Care: Did the patient need  help bathing, dressing, using the toilet or eating? Independent  Indoor Mobility: Did the patient need assistance with walking from room to room (with or without device)? Independent  Stairs: Did the patient need assistance with internal or external stairs (with or without device)? Independent  Functional Cognition: Did the patient need help planning regular tasks such as shopping or remembering to take medications? Independent  Patient Information Are you of Hispanic, Latino/a,or Spanish origin?: A. No, not of Hispanic, Latino/a, or Spanish origin What is your race?: A. White Do you need or want an interpreter to communicate with a doctor or health care staff?: 0. No  Patient's Response To:  Health Literacy and Transportation Is the patient able to respond to  health literacy and transportation needs?: Yes Health Literacy - How often do you need to have someone help you when you read instructions, pamphlets, or other written material from your doctor or pharmacy?: Sometimes In the past 12 months, has lack of transportation kept you from medical appointments or from getting medications?: No In the past 12 months, has lack of transportation kept you from meetings, work, or from getting things needed for daily living?: No  Journalist, newspaper / Equipment Home Assistive Devices/Equipment: None  Prior Device Use: Indicate devices/aids used by the patient prior to current illness, exacerbation or injury? None of the above  Current Functional Level Cognition  Overall Cognitive Status: Impaired/Different from baseline Current Attention Level: Sustained Orientation Level: Oriented to person, Oriented to place, Disoriented to time, Disoriented to situation Following Commands: Follows one step commands consistently Safety/Judgement: Decreased awareness of deficits General Comments: Patient with decreased STM, despite errorless learning techniques patient requires frequent reorientation     Extremity Assessment (includes Sensation/Coordination)  Upper Extremity Assessment: Generalized weakness, Right hand dominant, RUE deficits/detail RUE Deficits / Details: decreased sensation in R arm, tremulous with all movement RUE Sensation: decreased light touch RUE Coordination: decreased fine motor, decreased gross motor  Lower Extremity Assessment: Defer to PT evaluation RLE Deficits / Details: 3/5, Pt with c/o R knee pain, with ROM, WB, and palpation. Noted bruising at patella. RLE: Unable to fully assess due to pain    ADLs  Overall ADL's : Needs assistance/impaired Eating/Feeding: Set up, Sitting Grooming: Set up, Sitting Upper Body Bathing: Minimal assistance, Sitting Lower Body Bathing: Moderate assistance, Sit to/from stand, Sitting/lateral leans Upper Body Dressing : Minimal assistance, Sitting Lower Body Dressing: Moderate assistance, Maximal assistance, Sitting/lateral leans, Sit to/from stand Toilet Transfer: Moderate assistance, +2 for safety/equipment, +2 for physical assistance, Stand-pivot, BSC/3in1 Toilet Transfer Details (indicate cue type and reason): simulated Toileting- Clothing Manipulation and Hygiene: Moderate assistance, Sit to/from stand, Sitting/lateral lean Functional mobility during ADLs: Moderate assistance, +2 for physical assistance, +2 for safety/equipment, Cueing for sequencing General ADL Comments: Patient presenting with R sided sensation differences in comparison to L, tremors in RUE, decreased cognition and expressive difficulties, potential R sided visual impairment, and need for increased assist in order to complete ADLs and functional mobility.    Mobility  Overal bed mobility: Needs Assistance Bed Mobility: Supine to Sit, Sit to Supine Supine to sit: Mod assist, Used rails Sit to supine: Mod assist, Used rails General bed mobility comments: cues for sequencing, assist with RLE and trunk. Increased assist due to limiting WB thru L wrist (a  line).    Transfers  Overall transfer level: Needs assistance Equipment used: 2 person hand held assist Transfers: Sit to/from Stand Sit to Stand: Mod assist General transfer comment: assist to power up and stabilize balance, increased time    Ambulation / Gait / Stairs / Wheelchair Mobility  Ambulation/Gait Ambulation/Gait assistance: +2 physical assistance, Mod assist Assistive device: 2 person hand held assist Gait Pattern/deviations: Antalgic General Gait Details: very unsteady, antalgic. Unable to safely progress forward gait. Able to take 2-3 shuffle sidesteps bedside. Would benefit from RW after a line is d/c'd.    Posture / Balance Balance Overall balance assessment: Needs assistance Sitting-balance support: Feet supported, Single extremity supported Sitting balance-Leahy Scale: Good Standing balance support: Bilateral upper extremity supported, During functional activity Standing balance-Leahy Scale: Poor    Special needs/care consideration Hgb A1c 7.1   Previous Home Environment  Living Arrangements: Alone  Lives With: Alone Available Help  at Discharge: Family, Available 24 hours/day (son and nephew to provide 24/7 assist) Type of Home: House Home Layout: One level Home Access: Stairs to enter [employer] of Steps: 6 Bathroom Shower/Tub: Associate Professor: Yes How Accessible: Accessible via walker Home Care Services: No Additional Comments: clairifed with son  Discharge Living Setting Plans for Discharge Living Setting: Patient's home, Alone, House Type of Home at Discharge: House Discharge Home Layout: One level Discharge Home Access: Stairs to enter Entrance Stairs-Rails: None Entrance Stairs-Number of Steps: 6 Discharge Bathroom Shower/Tub: Tub/shower unit Discharge Bathroom Toilet: Standard Discharge Bathroom Accessibility: Yes How Accessible: Accessible via walker Does the patient have any  problems obtaining your medications?: No  Social/Family/Support Systems Patient Roles: Parent Contact Information: son, Kerem Gilmer Cower Anticipated Caregiver: son and nephew Anticipated Caregiver's Contact Information: see contacts Ability/Limitations of Caregiver: son works, but will take time off and nephew can asisst Caregiver Availability: 24/7 Discharge Plan Discussed with Primary Caregiver: Yes Is Caregiver In Agreement with Plan?: Yes Does Caregiver/Family have Issues with Lodging/Transportation while Pt is in Rehab?: No  Goals Patient/Family Goal for Rehab: supervision PT, supervision to min assist OT, supervision SLP Expected length of stay: ELOS 10 to 12 days Pt/Family Agrees to Admission and willing to participate: Yes Program Orientation Provided & Reviewed with Pt/Caregiver Including Roles  & Responsibilities: Yes  Decrease burden of Care through IP rehab admission: n/a  Possible need for SNF placement upon discharge: not anticipated  Patient Condition: I have reviewed medical records from [facility], spoken with patient and son. I met with patient at the bedside for inpatient rehabilitation assessment.  Patient will benefit from ongoing PT, OT, and SLP, can actively participate in 3 hours of therapy a day 5 days of the week, and can make measurable gains during the admission.  Patient will also benefit from the coordinated team approach during an Inpatient Acute Rehabilitation admission.  The patient will receive intensive therapy as well as Rehabilitation physician, nursing, social worker, and care management interventions.  Due to bladder management, bowel management, safety, skin/wound care, disease management, medication administration, pain management, and patient education the patient requires 24 hour a day rehabilitation nursing.  The patient is currently *** with mobility and basic ADLs.  Discharge setting and therapy post discharge at home with home health is anticipated.   Patient has agreed to participate in the Acute Inpatient Rehabilitation Program and will admit today.  Preadmission Screen Completed By:  Clois Dupes, RN MSN 07/10/2023 3:09 PM ______________________________________________________________________   Discussed status with Dr. Marland Kitchen on *** at *** and received approval for admission today.  Admission Coordinator:  Clois Dupes, RN MSN time Marland KitchenDorna Bloom ***   Assessment/Plan: Diagnosis: Does the need for close, 24 hr/day Medical supervision in concert with the patient's rehab needs make it unreasonable for this patient to be served in a less intensive setting? {yes_no_potentially:3041433} Co-Morbidities requiring supervision/potential complications: *** Due to {due VO:5366440}, does the patient require 24 hr/day rehab nursing? {yes_no_potentially:3041433} Does the patient require coordinated care of a physician, rehab nurse, PT, OT, and SLP to address physical and functional deficits in the context of the above medical diagnosis(es)? {yes_no_potentially:3041433} Addressing deficits in the following areas: {deficits:3041436} Can the patient actively participate in an intensive therapy program of at least 3 hrs of therapy 5 days a week? {yes_no_potentially:3041433} The potential for patient to make measurable gains while on inpatient rehab is {potential:3041437} Anticipated functional outcomes upon discharge from inpatient rehab: {functional outcomes:304600100} PT, {functional  outcomes:304600100} OT, {functional outcomes:304600100} SLP Estimated rehab length of stay to reach the above functional goals is: *** Anticipated discharge destination: {anticipated dc setting:21604} 10. Overall Rehab/Functional Prognosis: {potential:3041437}   MD Signature: ***

## 2023-07-10 NOTE — Plan of Care (Signed)
  Problem: Education: Goal: Knowledge of disease or condition will improve Outcome: Progressing Goal: Knowledge of secondary prevention will improve (MUST DOCUMENT ALL) Outcome: Progressing   Problem: Ischemic Stroke/TIA Tissue Perfusion: Goal: Complications of ischemic stroke/TIA will be minimized Outcome: Progressing   Problem: Coping: Goal: Will verbalize positive feelings about self Outcome: Progressing   Problem: Self-Care: Goal: Ability to participate in self-care as condition permits will improve Outcome: Progressing Goal: Verbalization of feelings and concerns over difficulty with self-care will improve Outcome: Progressing Goal: Ability to communicate needs accurately will improve Outcome: Progressing

## 2023-07-10 NOTE — Progress Notes (Addendum)
STROKE TEAM PROGRESS NOTE   BRIEF HPI Cory Brown is a 72 y.o. 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 He was found on the ground by a neighbor today and his son went to see him and saw that he was weaker on the right side with a right facial droop slurred speech and a "1000 yard stare". At baseline it is reported that he has recovered well from his stroke and is currently ambulating independently but in physical therapy for minor right leg weakness. On exam, had r-sided weakness, slurred speech, r facial droop. LKW 2130 9/7. NIH 12.  Previous M1 occlusion with thrombectomy in 2023, etiology thought to be d/t Afib, noncompliance with Pradaxa. Unknown current compliance.    SIGNIFICANT HOSPITAL EVENTS 9/8: CTA shows Left ICA occlusion  Taken for mech thrombectomy, TICI 3 achieved.  9/9: Passed swallow. PT/OT recommending CIR.   INTERIM HISTORY/SUBJECTIVE On exam, patient does have partial right field cut, right facial droop, decreased fine motor movements on the right hand, word finding difficulties, decreased movement of the right side, decreased sensation on the right side.  Cleviprex off since 9/9 afternoon.  Will transfer out of ICU.  Therapy evaluation recommends inpatient rehab  OBJECTIVE  CBC    Component Value Date/Time   WBC 6.6 07/10/2023 0522   RBC 4.96 07/10/2023 0522   HGB 16.1 07/10/2023 0522   HCT 47.1 07/10/2023 0522   PLT 154 07/10/2023 0522   MCV 95.0 07/10/2023 0522   MCH 32.5 07/10/2023 0522   MCHC 34.2 07/10/2023 0522   RDW 13.2 07/10/2023 0522   LYMPHSABS 0.9 07/08/2023 1052   MONOABS 0.9 07/08/2023 1052   EOSABS 0.0 07/08/2023 1052   BASOSABS 0.1 07/08/2023 1052    BMET    Component Value Date/Time   NA 139 07/10/2023 0522   K 3.7 07/10/2023 0522   CL 104 07/10/2023 0522   CO2 22 07/10/2023 0522   GLUCOSE 98  07/10/2023 0522   BUN 31 (H) 07/10/2023 0522   CREATININE 1.17 07/10/2023 0522   CALCIUM 8.8 (L) 07/10/2023 0522   GFRNONAA >60 07/10/2023 0522    IMAGING past 24 hours MR BRAIN WO CONTRAST  Result Date: 07/09/2023 CLINICAL DATA:  Stroke, follow up. EXAM: MRI HEAD WITHOUT CONTRAST MRA HEAD WITHOUT CONTRAST TECHNIQUE: Multiplanar, multi-echo pulse sequences of the brain and surrounding structures were acquired without intravenous contrast. Angiographic images of the Circle of Willis were acquired using MRA technique without intravenous contrast. COMPARISON:  CTA head/neck 07/08/2023. FINDINGS: MRI HEAD FINDINGS Brain: Scattered foci of acute infarction throughout the left MCA and bilateral ACA territories. No acute hemorrhage or significant mass effect. Unchanged encephalomalacia along the left superior temporal lobe, left frontal operculum, and right middle frontal gyrus from prior infarcts. Old lacunar infarcts in the bilateral basal ganglia and thalami. No hydrocephalus or extra-axial collection. Vascular: See below. Skull and upper cervical spine: Normal marrow signal. Sinuses/Orbits: No acute findings. Other: None. MRA HEAD FINDINGS Anterior circulation: Intracranial ICAs are patent without stenosis or aneurysm. The proximal ACAs and MCAs are patent without stenosis or aneurysm. Distal branches are symmetric. Posterior circulation: Normal basilar artery. The SCAs, AICAs and PICAs are patent proximally. The PCAs are patent proximally without stenosis or aneurysm. Distal branches are symmetric. Anatomic variants: None. IMPRESSION: 1. Scattered foci of acute infarction throughout the left MCA and bilateral ACA  territories. No acute hemorrhage or significant mass effect. 2. No large vessel occlusion or high-grade stenosis in the head. Electronically Signed   By: Orvan Falconer M.D.   On: 07/09/2023 17:43   MR ANGIO HEAD WO CONTRAST  Result Date: 07/09/2023 CLINICAL DATA:  Stroke, follow up. EXAM: MRI  HEAD WITHOUT CONTRAST MRA HEAD WITHOUT CONTRAST TECHNIQUE: Multiplanar, multi-echo pulse sequences of the brain and surrounding structures were acquired without intravenous contrast. Angiographic images of the Circle of Willis were acquired using MRA technique without intravenous contrast. COMPARISON:  CTA head/neck 07/08/2023. FINDINGS: MRI HEAD FINDINGS Brain: Scattered foci of acute infarction throughout the left MCA and bilateral ACA territories. No acute hemorrhage or significant mass effect. Unchanged encephalomalacia along the left superior temporal lobe, left frontal operculum, and right middle frontal gyrus from prior infarcts. Old lacunar infarcts in the bilateral basal ganglia and thalami. No hydrocephalus or extra-axial collection. Vascular: See below. Skull and upper cervical spine: Normal marrow signal. Sinuses/Orbits: No acute findings. Other: None. MRA HEAD FINDINGS Anterior circulation: Intracranial ICAs are patent without stenosis or aneurysm. The proximal ACAs and MCAs are patent without stenosis or aneurysm. Distal branches are symmetric. Posterior circulation: Normal basilar artery. The SCAs, AICAs and PICAs are patent proximally. The PCAs are patent proximally without stenosis or aneurysm. Distal branches are symmetric. Anatomic variants: None. IMPRESSION: 1. Scattered foci of acute infarction throughout the left MCA and bilateral ACA territories. No acute hemorrhage or significant mass effect. 2. No large vessel occlusion or high-grade stenosis in the head. Electronically Signed   By: Orvan Falconer M.D.   On: 07/09/2023 17:43   ECHOCARDIOGRAM COMPLETE  Result Date: 07/09/2023    ECHOCARDIOGRAM REPORT   Patient Name:   Cory Brown Gamel Date of Exam: 07/09/2023 Medical Rec #:  [id number]     Height:       73.0 in Accession #:    [id number]    Weight:       208.3 lb Date of Birth:  1951-08-29     BSA:          2.189 m Patient Age:    72 years      BP:           137/63 mmHg Patient Gender: M              HR:           71 bpm. Exam Location:  Inpatient Procedure: 2D Echo, Cardiac Doppler and Color Doppler Indications:    Stroke I63.9  History:        Patient has prior history of Echocardiogram examinations, most                 recent 12/06/2021. Stroke; Risk Factors:Diabetes, Hypertension,                 Dyslipidemia and Non-Smoker.  Sonographer:    Dondra Prader RVT RCS Referring Phys: [id number] [employer]  Sonographer Comments: Suboptimal subcostal window. IMPRESSIONS  1. Left ventricular ejection fraction, by estimation, is 60 to 65%. The left ventricle has normal function. The left ventricle has no regional wall motion abnormalities. There is mild concentric left ventricular hypertrophy. Left ventricular diastolic parameters are indeterminate.  2. Right ventricular systolic function is normal. The right ventricular size is normal. There is mildly elevated pulmonary artery systolic pressure.  3. Left atrial size was severely dilated.  4. Right atrial size was severely dilated.  5. The mitral valve is grossly normal. Trivial mitral valve regurgitation.  6. Tricuspid valve regurgitation is mild to moderate.  7. The aortic valve is tricuspid. There is mild calcification of the aortic valve. Aortic valve regurgitation is not visualized.  8. The inferior vena cava is normal in size with greater than 50% respiratory variability, suggesting right atrial pressure of 3 mmHg. Conclusion(s)/Recommendation(s): No intracardiac source of embolism detected on this transthoracic study. Consider a transesophageal echocardiogram to exclude cardiac source of embolism if clinically indicated. FINDINGS  Left Ventricle: Left ventricular ejection fraction, by estimation, is 60 to 65%. The left ventricle has normal function. The left ventricle has no regional wall motion abnormalities. The left ventricular internal cavity size was normal in size. There is  mild concentric left ventricular hypertrophy. Left ventricular diastolic  parameters are indeterminate. Right Ventricle: The right ventricular size is normal. Right ventricular systolic function is normal. There is mildly elevated pulmonary artery systolic pressure. The tricuspid regurgitant velocity is 3.15 m/s, and with an assumed right atrial pressure of 3 mmHg, the estimated right ventricular systolic pressure is 42.7 mmHg. Left Atrium: Left atrial size was severely dilated. Right Atrium: Right atrial size was severely dilated. Pericardium: There is no evidence of pericardial effusion. Mitral Valve: The mitral valve is grossly normal. Trivial mitral valve regurgitation. Tricuspid Valve: The tricuspid valve is normal in structure. Tricuspid valve regurgitation is mild to moderate. Aortic Valve: The aortic valve is tricuspid. There is mild calcification of the aortic valve. Aortic valve regurgitation is not visualized. Aortic valve mean gradient measures 8.0 mmHg. Aortic valve peak gradient measures 14.0 mmHg. Aortic valve area, by  VTI measures 1.66 cm. Pulmonic Valve: Pulmonic valve regurgitation is not visualized. Aorta: The aortic root and ascending aorta are structurally normal, with no evidence of dilitation. Venous: The inferior vena cava is normal in size with greater than 50% respiratory variability, suggesting right atrial pressure of 3 mmHg. IAS/Shunts: No atrial level shunt detected by color flow Doppler. Additional Comments: A device lead is visualized.  LEFT VENTRICLE PLAX 2D LVIDd:         4.60 cm LVIDs:         3.30 cm LV PW:         1.20 cm LV IVS:        1.20 cm LVOT diam:     1.90 cm LV SV:         59 LV SV Index:   27 LVOT Area:     2.84 cm  RIGHT VENTRICLE RV Basal diam:  4.60 cm RV Mid diam:    3.60 cm RV S prime:     17.30 cm/s TAPSE (M-mode): 1.7 cm LEFT ATRIUM              Index        RIGHT ATRIUM           Index LA diam:        4.80 cm  2.19 cm/m   RA Area:     28.80 cm LA Vol (A2C):   124.0 ml 56.64 ml/m  RA Volume:   97.90 ml  44.72 ml/m LA Vol (A4C):    81.5 ml  37.23 ml/m LA Biplane Vol: 123.0 ml 56.18 ml/m  AORTIC VALVE                     PULMONIC VALVE AV Area (Vmax):    1.79 cm      PV Vmax:       1.46 m/s AV Area (Vmean):   1.76  cm      PV Peak grad:  8.6 mmHg AV Area (VTI):     1.66 cm AV Vmax:           187.00 cm/s AV Vmean:          129.000 cm/s AV VTI:            0.353 m AV Peak Grad:      14.0 mmHg AV Mean Grad:      8.0 mmHg LVOT Vmax:         118.00 cm/s LVOT Vmean:        79.900 cm/s LVOT VTI:          0.207 m LVOT/AV VTI ratio: 0.59  AORTA Ao Root diam: 3.10 cm Ao Asc diam:  3.80 cm MITRAL VALVE                TRICUSPID VALVE MV Area (PHT): 3.60 cm     TR Peak grad:   39.7 mmHg MV Decel Time: 211 msec     TR Vmax:        315.00 cm/s MV E velocity: 116.00 cm/s MV A velocity: 29.00 cm/s   SHUNTS MV E/A ratio:  4.00         Systemic VTI:  0.21 m                             Systemic Diam: 1.90 cm Carolan Clines Electronically signed by Carolan Clines Signature Date/Time: 07/09/2023/12:01:57 PM    Final    DG Shoulder Right Port  Result Date: 07/09/2023 CLINICAL DATA:  Pain from fall. EXAM: RIGHT SHOULDER - 1 VIEW COMPARISON:  Right shoulder radiographs 01/02/2023 FINDINGS: Moderate glenohumeral joint space narrowing with mild inferior glenoid degenerative osteophytes, similar to prior. Moderate acromioclavicular joint space narrowing and inferior greater than superior osteophytes, similar to prior. Mild distal lateral broad-based subacromial spurring. No acute fracture or dislocation. IMPRESSION: Moderate glenohumeral and acromioclavicular osteoarthritis, similar to prior. Electronically Signed   By: Neita Garnet M.D.   On: 07/09/2023 10:46   DG Knee Right Port  Result Date: 07/09/2023 CLINICAL DATA:  Pain from fall. EXAM: PORTABLE RIGHT KNEE - 1-2 VIEW COMPARISON:  None Available. FINDINGS: Mildly decreased bone mineralization. Mild-to-moderate medial compartment joint space narrowing with minimal peripheral osteophytosis. Small joint effusion.  Mild superior and minimal inferior patellar degenerative spurring. Minimal chronic enthesopathic change at the quadriceps insertion on the patella. There is calcification measuring approximately 1.5 cm in length overlying the proximal medial collateral ligament, greatest at the mid height of the medial femoral condyle. This likely reflects the sequela of remote injury to the medial collateral ligament. No acute fracture is seen.  No dislocation. IMPRESSION: 1. No acute fracture. 2. Mild-to-moderate medial compartment osteoarthritis. 3. Small joint effusion. 4. Calcification overlying the proximal medial collateral ligament, likely the sequela of remote medial collateral ligament injury. Electronically Signed   By: Neita Garnet M.D.   On: 07/09/2023 10:44    Vitals:   07/10/23 0600 07/10/23 0700 07/10/23 0800 07/10/23 0900  BP: (!) 165/88 (!) 173/94 (!) 179/83 (!) 168/83  Pulse: 69 69 70 74  Resp: 15 17 20  (!) 24  Temp:   97.6 F (36.4 C)   TempSrc:   Oral   SpO2: 95% 96% 96% 92%  Weight:       PHYSICAL EXAM General:  Alert, well-nourished, well-developed patient in no acute distress Psych:  Mood and affect  appropriate for situation CV: Regular rate and rhythm on monitor Respiratory:  Regular, unlabored respirations on room air GI: Abdomen soft and nontender  NEURO:  Mental Status: AA&Ox2, follows commands.  Speech/Language: Slight dysarthria and Word-finding difficulties present. Decreased comprehension, memory.  Cranial Nerves:  II: PERRL. Right visual field cut.  III, IV, VI: EOMI. Eyelids elevate symmetrically.  V: Sensation is intact to light touch and symmetrical to face.  VII: Right facial droop VIII: hearing intact to voice. IX, X: Palate elevates symmetrically. Phonation is normal.  DG:[id number] shrug 5/5. XII: tongue is midline without fasciculations. Motor: 5/5 strength to all muscle groups tested.  Tone: is normal and bulk is normal Sensation- Decreased to right side.   Coordination: FTN, fine motor decreased on right. .No drift.  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  Thrombus extends to the level of the ICA terminus Moderate right mid P2 narrowing Perfusion mismatch of 16 mL with perfusion abnormality in the region of the left basal ganglia Post IR CT: No hemorrhage  MRI:  Scattered foci of acute infarct throughout the left MCA and bilateral ACA. Acute hemorrhage or significant mass effect MRA  No LVO or high-grade stenosis  2D Echo: EF 60 to 65%, mild LVH, elevated pulmonary artery systolic pressure, severely dilated left atria, severely dilated right atria, trivial MVR mild calcification of the aortic valve.   LDL 39 HgbA1c 7.1 VTE prophylaxis - SCds Pradaxa (dabigatran) twice a day prior to admission, now on Pradaxa. (Unknown current compliance of pradaxa.) Therapy recommendations:  CIR SLP cognitive eval ordered Disposition:  pending  Hx of Stroke 2010 S/p Left MCA stroke s/p thrombectomy 11/2021 Etiology thought to be d/t afib, not compliant with pradaxa 2023 CT showed multiple remote infarcts in bilateral frontal lobes Discharged on pradaxa  Atrial fibrillation Home Meds: pradaxa, metoprolol Patient previously stopped Pradaxa 07/2021 by himself, non-compliant history. [facility], [facility] Continue telemetry monitoring Anticoagulation with pradaxa    Hypertension CAD s/p stent  x2 S/p MI  Home meds:  Toprol-XL 100mg , resumed Stable Blood Pressure Goal: SBP 120-160 for first 24 hours then less than 180   Hyperlipidemia Home meds:  Crestor 20mg , resumed in hospital LDL 39, goal < 70 Continue statin at discharge  Diabetes type II Uncontrolled Home meds:  jardiance 25mg , glimepride 2mg , metformin 500mg  HgbA1c 7.1, goal < 7.0 CBGs SSI Recommend close follow-up with PCP for better DM control  Other Stroke Risk Factors Family hx stroke (mother)  Other Active Problems Essential Tremor Saw Dr. Marjory Lies at [facility] Was previously on Primidone 50mg  BID Diabetic nerve pain  Gabapentin 300mg  HS, restarted  Hospital day # 2   Pt seen by Neuro NP/APP and later by MD. Note/plan to be edited by MD as needed.    Lynnae January, DNP, AGACNP-BC [facility]  I have personally obtained history,examined this patient, reviewed notes, independently viewed imaging studies, participated in medical decision making and plan of care.ROS completed by me personally and pertinent positives fully documented  I have made any additions or clarifications directly to  the above note. Agree with note above.  Patient is doing well and plan to mobilize out of bed and therapy consults.  Transfer out of ICU today.  Continue Pradaxa for his A-fib likely need inpatient rehab in the next few days.  No family available at the bedside today.  Greater than 50% time during this 35-minute visit were spent in counseling and coordination of care and discussion with patient and care team and answering questions.  Delia Heady, MD Medical Director [facility] Pager: [phone] 07/10/2023 2:59 PM  Please use AMION for contact information & EPIC for messaging.   To contact Stroke Continuity provider, please refer to [email or web address]. After hours, contact General Neurology

## 2023-07-10 NOTE — Progress Notes (Signed)
Physical Therapy Treatment Patient Details Name: WILHELM HASSAN MRN: [id number] DOB: 1951/02/04 Today's Date: 07/10/2023   History of Present Illness Patient is a 72 yo male presenting to the ED with R sided weakness, slurred speech and facial droop on 07/08/23. 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 PMH - afib, DM, HTN, MI, CVA    PT Comments  Pt received in bed, awake and able to verbalize that his bed is wet. Pt following commands appropriately with increased time, needs frequent repetition of vc's with mobility tasks. Pt stood with min A and was able to ambulate with RW 17' with min A. Pt unsteady and with shuffling gait. Worked on standing balance and strengthening RLE. Continue to recommend >3 hrs/ day post acute rehab. PT will continue to follow.     If plan is discharge home, recommend the following: A little help with walking and/or transfers;A little help with bathing/dressing/bathroom;Assistance with cooking/housework;Direct supervision/assist for medications management;Direct supervision/assist for financial management;Assist for transportation;Help with stairs or ramp for entrance;Supervision due to cognitive status   Can travel by private vehicle        Equipment Recommendations  Rolling walker (2 wheels)    Recommendations for Other Services Rehab consult     Precautions / Restrictions Precautions Precautions: Fall Restrictions Weight Bearing Restrictions: No     Mobility  Bed Mobility Overal bed mobility: Needs Assistance Bed Mobility: Supine to Sit     Supine to sit: Min assist     General bed mobility comments: min HHA and increased time to come to EOB    Transfers Overall transfer level: Needs assistance Equipment used: Rolling walker (2 wheels), Ambulation equipment used Transfers: Sit to/from Stand, Bed to chair/wheelchair/BSC Sit to Stand: Min assist           General transfer comment: pt first stood to stedy with min A.  Pivoted to chair and sat. Pt then stood to RW from recliner with min A for power up. Performed multiple times. had some difficulty from low surface Transfer via Lift Equipment: Stedy  Ambulation/Gait Ambulation/Gait assistance: Editor, commissioning (Feet): 17 Feet (7', 10') Assistive device: Rolling walker (2 wheels) Gait Pattern/deviations: Trunk flexed, Shuffle, Decreased weight shift to right Gait velocity: decreased Gait velocity interpretation: <1.31 ft/sec, indicative of household ambulator Pre-gait activities: marching in place, 10x R and 10x L General Gait Details: pt able to ambulate fwd and bkwd, needed more assist with bkwd walking due to mild posterior lean. vc's to pick up feet.   Stairs             Wheelchair Mobility     Tilt Bed    Modified Rankin (Stroke Patients Only) Modified Rankin (Stroke Patients Only) Pre-Morbid Rankin Score: No symptoms Modified Rankin: Moderately severe disability     Balance Overall balance assessment: Needs assistance Sitting-balance support: Feet supported, Single extremity supported Sitting balance-Leahy Scale: Good Sitting balance - Comments: pt able to maintain balance sitting EOB and edge of chair, slowed reaction time to perturbation   Standing balance support: Bilateral upper extremity supported, During functional activity Standing balance-Leahy Scale: Poor Standing balance comment: needs UE support as well as external assist for safety                            Cognition Arousal: Alert Behavior During Therapy: Flat affect Overall Cognitive Status: Impaired/Different from baseline Area of Impairment: Orientation, Attention, Memory, Following commands, Safety/judgement, Awareness, Problem  solving                 Orientation Level: Disoriented to, Time, Situation, Place Current Attention Level: Sustained Memory: Decreased short-term memory Following Commands: Follows one step commands  consistently Safety/Judgement: Decreased awareness of deficits Awareness: Emergent Problem Solving: Slow processing, Difficulty sequencing, Requires verbal cues General Comments: pt following commands, needs frequent cues though for repeated tasks. Minimal verbalization, answers questions but does not self generate conversation        Exercises      General Comments General comments (skin integrity, edema, etc.): VSS on RA      Pertinent Vitals/Pain Pain Assessment Pain Assessment: No/denies pain    Home Living                          Prior Function            PT Goals (current goals can now be found in the care plan section) Acute Rehab PT Goals Patient Stated Goal: go home PT Goal Formulation: With patient Time For Goal Achievement: 07/23/23 Potential to Achieve Goals: Good Progress towards PT goals: Progressing toward goals    Frequency    Min 1X/week      PT Plan      Co-evaluation              AM-PAC PT "6 Clicks" Mobility   Outcome Measure  Help needed turning from your back to your side while in a flat bed without using bedrails?: A Little Help needed moving from lying on your back to sitting on the side of a flat bed without using bedrails?: A Little Help needed moving to and from a bed to a chair (including a wheelchair)?: A Little Help needed standing up from a chair using your arms (e.g., wheelchair or bedside chair)?: A Little Help needed to walk in hospital room?: A Little Help needed climbing 3-5 steps with a railing? : Total 6 Click Score: 16    End of Session Equipment Utilized During Treatment: Gait belt Activity Tolerance: Patient tolerated treatment well Patient left: with call bell/phone within reach;with chair alarm set;in chair Nurse Communication: Mobility status PT Visit Diagnosis: Unsteadiness on feet (R26.81);Other abnormalities of gait and mobility (R26.89);Pain     Time: 4098-1191 PT Time Calculation (min)  (ACUTE ONLY): 32 min  Charges:    $Gait Training: 8-22 mins $Therapeutic Exercise: 8-22 mins PT General Charges $$ ACUTE PT VISIT: 1 Visit                     Lyanne Co, PT  Acute Rehab Services Secure chat preferred Office [phone]    Lawana Chambers Kathryne Ramella 07/10/2023, 5:08 PM

## 2023-07-11 ENCOUNTER — Other Ambulatory Visit: Payer: Self-pay

## 2023-07-11 ENCOUNTER — Inpatient Hospital Stay (HOSPITAL_COMMUNITY)
Admission: RE | Admit: 2023-07-11 | Discharge: 2023-07-25 | DRG: 057 | Disposition: A | Discharge status: Home / Self Care | Payer: No Typology Code available for payment source | Source: Intra-hospital | Attending: Physical Medicine and Rehabilitation | Admitting: Physical Medicine and Rehabilitation

## 2023-07-11 DIAGNOSIS — M19011 Primary osteoarthritis, right shoulder: Secondary | ICD-10-CM | POA: Diagnosis present

## 2023-07-11 DIAGNOSIS — I63232 Cerebral infarction due to unspecified occlusion or stenosis of left carotid arteries: Secondary | ICD-10-CM | POA: Diagnosis not present

## 2023-07-11 DIAGNOSIS — G250 Essential tremor: Secondary | ICD-10-CM | POA: Insufficient documentation

## 2023-07-11 DIAGNOSIS — R32 Unspecified urinary incontinence: Secondary | ICD-10-CM | POA: Diagnosis present

## 2023-07-11 DIAGNOSIS — I69120 Aphasia following nontraumatic intracerebral hemorrhage: Secondary | ICD-10-CM

## 2023-07-11 DIAGNOSIS — G8929 Other chronic pain: Secondary | ICD-10-CM | POA: Diagnosis present

## 2023-07-11 DIAGNOSIS — F329 Major depressive disorder, single episode, unspecified: Secondary | ICD-10-CM | POA: Diagnosis not present

## 2023-07-11 DIAGNOSIS — I482 Chronic atrial fibrillation, unspecified: Secondary | ICD-10-CM | POA: Diagnosis present

## 2023-07-11 DIAGNOSIS — I69320 Aphasia following cerebral infarction: Secondary | ICD-10-CM | POA: Diagnosis not present

## 2023-07-11 DIAGNOSIS — Z79899 Other long term (current) drug therapy: Secondary | ICD-10-CM

## 2023-07-11 DIAGNOSIS — Z87442 Personal history of urinary calculi: Secondary | ICD-10-CM | POA: Diagnosis not present

## 2023-07-11 DIAGNOSIS — R29810 Facial weakness: Secondary | ICD-10-CM | POA: Diagnosis present

## 2023-07-11 DIAGNOSIS — I69351 Hemiplegia and hemiparesis following cerebral infarction affecting right dominant side: Secondary | ICD-10-CM | POA: Diagnosis not present

## 2023-07-11 DIAGNOSIS — I63132 Cerebral infarction due to embolism of left carotid artery: Secondary | ICD-10-CM

## 2023-07-11 DIAGNOSIS — I10 Essential (primary) hypertension: Secondary | ICD-10-CM | POA: Diagnosis present

## 2023-07-11 DIAGNOSIS — Z833 Family history of diabetes mellitus: Secondary | ICD-10-CM

## 2023-07-11 DIAGNOSIS — Z8249 Family history of ischemic heart disease and other diseases of the circulatory system: Secondary | ICD-10-CM

## 2023-07-11 DIAGNOSIS — Z23 Encounter for immunization: Secondary | ICD-10-CM | POA: Diagnosis not present

## 2023-07-11 DIAGNOSIS — I69398 Other sequelae of cerebral infarction: Principal | ICD-10-CM

## 2023-07-11 DIAGNOSIS — Z823 Family history of stroke: Secondary | ICD-10-CM

## 2023-07-11 DIAGNOSIS — Z8673 Personal history of transient ischemic attack (TIA), and cerebral infarction without residual deficits: Secondary | ICD-10-CM | POA: Diagnosis not present

## 2023-07-11 DIAGNOSIS — R079 Chest pain, unspecified: Secondary | ICD-10-CM | POA: Diagnosis not present

## 2023-07-11 DIAGNOSIS — I252 Old myocardial infarction: Secondary | ICD-10-CM

## 2023-07-11 DIAGNOSIS — F321 Major depressive disorder, single episode, moderate: Secondary | ICD-10-CM | POA: Diagnosis not present

## 2023-07-11 DIAGNOSIS — R251 Tremor, unspecified: Secondary | ICD-10-CM | POA: Diagnosis present

## 2023-07-11 DIAGNOSIS — M25561 Pain in right knee: Secondary | ICD-10-CM | POA: Diagnosis present

## 2023-07-11 DIAGNOSIS — Z7984 Long term (current) use of oral hypoglycemic drugs: Secondary | ICD-10-CM | POA: Diagnosis not present

## 2023-07-11 DIAGNOSIS — Z886 Allergy status to analgesic agent status: Secondary | ICD-10-CM

## 2023-07-11 DIAGNOSIS — E1140 Type 2 diabetes mellitus with diabetic neuropathy, unspecified: Secondary | ICD-10-CM | POA: Diagnosis present

## 2023-07-11 DIAGNOSIS — M542 Cervicalgia: Secondary | ICD-10-CM | POA: Diagnosis present

## 2023-07-11 DIAGNOSIS — Z602 Problems related to living alone: Secondary | ICD-10-CM | POA: Diagnosis present

## 2023-07-11 DIAGNOSIS — M545 Low back pain, unspecified: Secondary | ICD-10-CM | POA: Diagnosis present

## 2023-07-11 DIAGNOSIS — R4701 Aphasia: Secondary | ICD-10-CM | POA: Diagnosis present

## 2023-07-11 DIAGNOSIS — I69192 Facial weakness following nontraumatic intracerebral hemorrhage: Secondary | ICD-10-CM

## 2023-07-11 DIAGNOSIS — R7989 Other specified abnormal findings of blood chemistry: Secondary | ICD-10-CM | POA: Diagnosis not present

## 2023-07-11 DIAGNOSIS — H538 Other visual disturbances: Secondary | ICD-10-CM | POA: Diagnosis present

## 2023-07-11 DIAGNOSIS — M25562 Pain in left knee: Secondary | ICD-10-CM | POA: Diagnosis present

## 2023-07-11 DIAGNOSIS — I69319 Unspecified symptoms and signs involving cognitive functions following cerebral infarction: Secondary | ICD-10-CM

## 2023-07-11 DIAGNOSIS — E7800 Pure hypercholesterolemia, unspecified: Secondary | ICD-10-CM | POA: Diagnosis present

## 2023-07-11 DIAGNOSIS — K30 Functional dyspepsia: Secondary | ICD-10-CM | POA: Diagnosis not present

## 2023-07-11 DIAGNOSIS — E1142 Type 2 diabetes mellitus with diabetic polyneuropathy: Secondary | ICD-10-CM | POA: Diagnosis not present

## 2023-07-11 DIAGNOSIS — I2510 Atherosclerotic heart disease of native coronary artery without angina pectoris: Secondary | ICD-10-CM | POA: Diagnosis present

## 2023-07-11 DIAGNOSIS — E118 Type 2 diabetes mellitus with unspecified complications: Secondary | ICD-10-CM | POA: Diagnosis present

## 2023-07-11 DIAGNOSIS — I63039 Cerebral infarction due to thrombosis of unspecified carotid artery: Secondary | ICD-10-CM

## 2023-07-11 DIAGNOSIS — I69392 Facial weakness following cerebral infarction: Secondary | ICD-10-CM | POA: Diagnosis not present

## 2023-07-11 DIAGNOSIS — I69198 Other sequelae of nontraumatic intracerebral hemorrhage: Secondary | ICD-10-CM | POA: Diagnosis not present

## 2023-07-11 DIAGNOSIS — Z955 Presence of coronary angioplasty implant and graft: Secondary | ICD-10-CM

## 2023-07-11 DIAGNOSIS — Z91199 Patient's noncompliance with other medical treatment and regimen due to unspecified reason: Secondary | ICD-10-CM

## 2023-07-11 LAB — GLUCOSE, CAPILLARY
Glucose-Capillary: 211 mg/dL — ABNORMAL HIGH (ref 70–99)
Glucose-Capillary: 170 mg/dL — ABNORMAL HIGH (ref 70–99)

## 2023-07-11 MED ORDER — TROLAMINE SALICYLATE 10 % EX CREA
TOPICAL_CREAM | Freq: Three times a day (TID) | CUTANEOUS | Status: DC
  Filled 2023-07-11 (×2): qty 85

## 2023-07-11 MED ORDER — AMLODIPINE BESYLATE 10 MG PO TABS
10.0000 mg | ORAL_TABLET | Freq: Every day | ORAL | Status: DC
  Administered 2023-07-11: 10 mg via ORAL
  Filled 2023-07-11: qty 1

## 2023-07-11 MED ORDER — BISACODYL 10 MG RE SUPP: 10.0000 mg | Freq: Every day | RECTAL | Status: DC | PRN

## 2023-07-11 MED ORDER — PROCHLORPERAZINE 25 MG RE SUPP: 12.5000 mg | Freq: Four times a day (QID) | RECTAL | Status: DC | PRN

## 2023-07-11 MED ORDER — SENNOSIDES-DOCUSATE SODIUM 8.6-50 MG PO TABS
2.0000 | ORAL_TABLET | Freq: Every evening | ORAL | Status: DC | PRN
  Administered 2023-07-21: 2 via ORAL
  Filled 2023-07-11: qty 2

## 2023-07-11 MED ORDER — ALUM & MAG HYDROXIDE-SIMETH 200-200-20 MG/5ML PO SUSP
30.0000 mL | ORAL | Status: DC | PRN
  Administered 2023-07-15: 30 mL via ORAL
  Filled 2023-07-11: qty 30

## 2023-07-11 MED ORDER — GABAPENTIN 100 MG PO CAPS
100.0000 mg | ORAL_CAPSULE | Freq: Every day | ORAL | Status: DC
  Administered 2023-07-12 – 2023-07-25 (×14): 100 mg via ORAL
  Filled 2023-07-11 (×14): qty 1

## 2023-07-11 MED ORDER — AMLODIPINE BESYLATE 10 MG PO TABS
10.0000 mg | ORAL_TABLET | Freq: Every day | ORAL | Status: DC
  Administered 2023-07-12 – 2023-07-25 (×14): 10 mg via ORAL
  Filled 2023-07-11 (×14): qty 1

## 2023-07-11 MED ORDER — INSULIN ASPART 100 UNIT/ML IJ SOLN
0.0000 [IU] | Freq: Three times a day (TID) | INTRAMUSCULAR | Status: DC
  Administered 2023-07-12: 1 [IU] via SUBCUTANEOUS
  Administered 2023-07-12 (×2): 2 [IU] via SUBCUTANEOUS
  Administered 2023-07-13: 1 [IU] via SUBCUTANEOUS
  Administered 2023-07-13 – 2023-07-16 (×10): 2 [IU] via SUBCUTANEOUS
  Administered 2023-07-16: 3 [IU] via SUBCUTANEOUS
  Administered 2023-07-17 (×2): 2 [IU] via SUBCUTANEOUS
  Administered 2023-07-17: 1 [IU] via SUBCUTANEOUS
  Administered 2023-07-18 – 2023-07-19 (×4): 2 [IU] via SUBCUTANEOUS
  Administered 2023-07-19: 1 [IU] via SUBCUTANEOUS
  Administered 2023-07-19: 2 [IU] via SUBCUTANEOUS
  Administered 2023-07-20 – 2023-07-22 (×5): 1 [IU] via SUBCUTANEOUS
  Administered 2023-07-22: 2 [IU] via SUBCUTANEOUS
  Administered 2023-07-23: 1 [IU] via SUBCUTANEOUS
  Administered 2023-07-23: 2 [IU] via SUBCUTANEOUS
  Administered 2023-07-24: 1 [IU] via SUBCUTANEOUS
  Administered 2023-07-24: 2 [IU] via SUBCUTANEOUS
  Administered 2023-07-25: 1 [IU] via SUBCUTANEOUS

## 2023-07-11 MED ORDER — TRAZODONE HCL 50 MG PO TABS: 25.0000 mg | ORAL_TABLET | Freq: Every evening | ORAL | Status: DC | PRN

## 2023-07-11 MED ORDER — PROCHLORPERAZINE MALEATE 5 MG PO TABS: 5.0000 mg | ORAL_TABLET | Freq: Four times a day (QID) | ORAL | Status: DC | PRN

## 2023-07-11 MED ORDER — INSULIN ASPART 100 UNIT/ML IJ SOLN
0.0000 [IU] | Freq: Every day | INTRAMUSCULAR | Status: DC
  Administered 2023-07-16: 2 [IU] via SUBCUTANEOUS

## 2023-07-11 MED ORDER — GUAIFENESIN-DM 100-10 MG/5ML PO SYRP: 5.0000 mL | ORAL_SOLUTION | Freq: Four times a day (QID) | ORAL | Status: DC | PRN

## 2023-07-11 MED ORDER — DABIGATRAN ETEXILATE MESYLATE 150 MG PO CAPS
150.0000 mg | ORAL_CAPSULE | Freq: Two times a day (BID) | ORAL | Status: DC
  Administered 2023-07-11 – 2023-07-25 (×28): 150 mg via ORAL
  Filled 2023-07-11 (×30): qty 1

## 2023-07-11 MED ORDER — PROCHLORPERAZINE EDISYLATE 10 MG/2ML IJ SOLN: 5.0000 mg | Freq: Four times a day (QID) | INTRAMUSCULAR | Status: DC | PRN

## 2023-07-11 MED ORDER — ROSUVASTATIN CALCIUM 5 MG PO TABS
10.0000 mg | ORAL_TABLET | Freq: Every day | ORAL | Status: DC
  Administered 2023-07-12 – 2023-07-25 (×14): 10 mg via ORAL
  Filled 2023-07-11 (×14): qty 2

## 2023-07-11 MED ORDER — POLYVINYL ALCOHOL 1.4 % OP SOLN: 1.0000 [drp] | OPHTHALMIC | Status: DC | PRN

## 2023-07-11 MED ORDER — DICLOFENAC SODIUM 1 % EX GEL
2.0000 g | Freq: Three times a day (TID) | CUTANEOUS | Status: DC
  Administered 2023-07-11 – 2023-07-25 (×32): 2 g via TOPICAL
  Filled 2023-07-11 (×3): qty 100

## 2023-07-11 MED ORDER — MUSCLE RUB 10-15 % EX CREA
TOPICAL_CREAM | Freq: Three times a day (TID) | CUTANEOUS | Status: DC
  Filled 2023-07-11: qty 85

## 2023-07-11 MED ORDER — SENNOSIDES-DOCUSATE SODIUM 8.6-50 MG PO TABS: 1.0000 | ORAL_TABLET | Freq: Every evening | ORAL | Status: DC | PRN

## 2023-07-11 MED ORDER — FLEET ENEMA RE ENEM: 1.0000 | ENEMA | Freq: Once | RECTAL | Status: DC | PRN

## 2023-07-11 MED ORDER — AMLODIPINE BESYLATE 10 MG PO TABS: 10.0000 mg | ORAL_TABLET | Freq: Every day | ORAL | Status: DC

## 2023-07-11 MED ORDER — DIPHENHYDRAMINE HCL 25 MG PO CAPS: 25.0000 mg | ORAL_CAPSULE | Freq: Four times a day (QID) | ORAL | Status: DC | PRN

## 2023-07-11 MED ORDER — INFLUENZA VAC A&B SURF ANT ADJ 0.5 ML IM SUSY
0.5000 mL | PREFILLED_SYRINGE | INTRAMUSCULAR | Status: AC
  Administered 2023-07-12: 0.5 mL via INTRAMUSCULAR
  Filled 2023-07-11: qty 0.5

## 2023-07-11 MED ORDER — METOPROLOL SUCCINATE ER 100 MG PO TB24: 100.0000 mg | ORAL_TABLET | Freq: Every day | ORAL | Status: DC

## 2023-07-11 MED ORDER — METOPROLOL SUCCINATE ER 50 MG PO TB24
100.0000 mg | ORAL_TABLET | Freq: Every day | ORAL | Status: DC
  Administered 2023-07-12 – 2023-07-25 (×14): 100 mg via ORAL
  Filled 2023-07-11 (×15): qty 2

## 2023-07-11 MED ORDER — DABIGATRAN ETEXILATE MESYLATE 150 MG PO CAPS: 150.0000 mg | ORAL_CAPSULE | Freq: Two times a day (BID) | ORAL | Status: AC

## 2023-07-11 MED ORDER — ROSUVASTATIN CALCIUM 10 MG PO TABS: 10.0000 mg | ORAL_TABLET | Freq: Every day | ORAL | Status: DC

## 2023-07-11 NOTE — H&P (Signed)
Physical Medicine and Rehabilitation Admission H&P    Chief Complaint  Patient presents with   Stroke with functional deficits   Cognitive deficits    Fall hx    HPI: Cory Brown is a 72 year old RH-male with history of L-MCA stroke w/residual mild RLE weakness, chronic LBP/neck pain, CAD s/p PTCA,  PPM, 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 He was found to have right sided weakness, right facial droop and slurred speech. UDS negative.  CTA head/neck with perfusion showed left ICA occluded at origin with thrombus except tending to level of ICA terminus, moderate narrowing mid P2 segment of right PCA and perfusion abnormality in the region of the left basal ganglia.  He underwent cerebral angio with mechanical thrombectomy of left ICA with complete recanalization by Dr. Lennox Laity to [name].  Follow-up MRI/MRI brain revealed scattered foci of acute infarct throughout the left MCA and bilateral ACA territories, no acute hemorrhage and no LVO. Chronic right frontal, left MCA and left basal ganglia infarcts and chronic left thalamic hemorrhage. 2D echo showed EF 60-65 % with mild concentric LVH, severe dilatation of b-atria and mild to moderate TVR. Dr. Pearlean Brownie felt that stroke was due to large vessel disease and recommended continuing Pradaxa as unknown compliance with medication. Patient with multiple strokes and hx of non compliance.    Right knee x-rays done due to pain revealing mild to moderate medial compartment OA and small joint effusion with calcification proximal medial collateral ligament likely due to remote injury.  Right shoulder x-ray showed moderate glenohumeral and acromioclavicular osteoarthritis no change compared to 03/24. 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 Therapy has been working with patient who is limited by right sided weakness and cognitive deficits. CIR recommended due to functional  decline. Reports blurred vision.    Review of Systems  Constitutional:  Negative for chills and fever.  HENT:  Negative for hearing loss.   Eyes:  Positive for blurred vision (at times can't see at times) and pain (lots of time).  Respiratory:  Positive for cough (off and on for some time). Negative for sputum production.   Cardiovascular:  Negative for chest pain and palpitations.  Gastrointestinal:  Negative for nausea and vomiting.  Genitourinary:  Negative for dysuria.  Musculoskeletal:  Positive for back pain and joint pain.  Neurological:  Positive for sensory change (in hand and feet) and headaches. Negative for dizziness.  Psychiatric/Behavioral:  Positive for memory loss.     Past Medical History:  Diagnosis Date   Acute bronchitis, unspecified 01/16/2022   Acute ischemic stroke (HCC) 12/05/2021   Arthritis    Atrial fibrillation (HCC)    Carpal tunnel syndrome 01/16/2022   Cerebral infarction, unspecified (HCC) 01/16/2022   Cerebrovascular accident [facility]) 01/16/2022   Dec 27, 2021 Entered By: [facility] D Comment: Left MCA stroke s/p thrombectomy, 12/05/2021.   Chronic atrial fibrillation (HCC) 09/17/2021   Coronary artery disease 01/16/2022   Dec 27, 2021 Entered By: [facility] D Comment: S/p stent Obtuse marginal 2, 10/2013.   Counseling, unspecified 01/16/2022   Diabetes mellitus without complication (HCC)    Essential (primary) hypertension 01/16/2022   Generalized weakness 09/17/2021   Hypercholesterolemia    Hyperglycemia due to diabetes mellitus (HCC) 09/17/2021   Hyperlipidemia 01/16/2022   Hypertension    Hyponatremia 09/17/2021   Kidney stones    Low back pain 01/16/2022   MI (myocardial infarction) (HCC)  Muscle weakness (generalized) 01/16/2022   Neck pain 01/16/2022   Non-traumatic rhabdomyolysis 09/19/2021   Special screening for malignant neoplasms, colon 11/30/2009   May 05, 2010 Entered By: [facility] D Comment: Normal per pt   Stroke (cerebrum) (HCC)  12/05/2021   Stroke (HCC) 2010   Thrombocytopenia (HCC) 09/18/2021   Traumatic rhabdomyolysis (HCC) 09/17/2021   Tremor    Ulnar neuropathy 01/16/2022    Past Surgical History:  Procedure Laterality Date   cardiac stents  2005   CHOLECYSTECTOMY  2007   IR CT HEAD LTD  12/05/2021   IR CT HEAD LTD  07/08/2023   IR PERCUTANEOUS ART THROMBECTOMY/INFUSION INTRACRANIAL INC DIAG ANGIO  12/05/2021   IR PERCUTANEOUS ART THROMBECTOMY/INFUSION INTRACRANIAL INC DIAG ANGIO  07/08/2023   IR US GUIDE VASC ACCESS RIGHT  07/08/2023   KIDNEY STONE SURGERY  1997-2003   RADIOLOGY WITH ANESTHESIA N/A 12/05/2021   Procedure: RADIOLOGY WITH ANESTHESIA;  Surgeon: Radiologist, Medication, MD;  Location: [facility] OR;  Service: Radiology;  Laterality: N/A;   RADIOLOGY WITH ANESTHESIA N/A 07/08/2023   Procedure: IR WITH ANESTHESIA;  Surgeon: Radiologist, Medication, MD;  Location: [facility] OR;  Service: Radiology;  Laterality: N/A;    Family History  Problem Relation Age of Onset   Hypertension Mother    Stroke Mother    Diabetes Father    Cancer Sister    Diabetes Brother    Cancer Brother     Social History: Lives alone. 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 He reports that he has never smoked. He has never used smokeless tobacco. He reports that he does not drink alcohol and does not use drugs.   Allergies  Allergen Reactions   Acetaminophen     Medications Prior to Admission  Medication Sig Dispense Refill   [START ON 07/12/2023] amLODipine (NORVASC) 10 MG tablet Take 1 tablet (10 mg total) by mouth daily.     dabigatran (PRADAXA) 150 MG CAPS capsule Take 1 capsule (150 mg total) by mouth every 12 (twelve) hours.     gabapentin (NEURONTIN) 100 MG capsule Take 100 mg by mouth daily.     [START ON 07/12/2023] metoprolol succinate (TOPROL-XL) 100 MG 24 hr tablet Take 1 tablet (100 mg total) by mouth daily. Take with or immediately following a meal.     [START ON 07/12/2023] rosuvastatin (CRESTOR) 10 MG tablet Take 1 tablet (10  mg total) by mouth daily.     senna-docusate (SENOKOT-S) 8.6-50 MG tablet Take 1 tablet by mouth at bedtime as needed for mild constipation.     Home: Home Living Family/patient expects to be discharged to:: Inpatient rehab Living Arrangements: Alone Available Help at Discharge: Family, Available 24 hours/day (son and nephew) Type of Home: House Home Access: Stairs to enter Secretary/administrator of Steps: 6 Home Layout: One level Bathroom Shower/Tub: Engineer, manufacturing systems: Standard Bathroom Accessibility: Yes Additional Comments: clairifed with son  Lives With: Alone   Functional History: Prior Function Prior Level of Function : Independent/Modified Independent, Driving Mobility Comments: no use of AD ADLs Comments: drove and independent per chart review   Functional Status:  Mobility: Bed Mobility Overal bed mobility: Needs Assistance Bed Mobility: Supine to Sit Supine to sit: Min assist, HOB elevated, Used rails Sit to supine: Mod assist, Used rails General bed mobility comments: cues for rail use and assistance to pull up Transfers Overall transfer level: Needs assistance Equipment used: Rolling walker (2 wheels) Transfers: Sit to/from Stand, Bed to chair/wheelchair/BSC Sit to Stand: Min assist Bed to/from  chair/wheelchair/BSC transfer type:: Via Theme park manager: [employer] transfer comment: cues for hand placement and safety with min assist to power up Ambulation/Gait Ambulation/Gait assistance: Contact guard assist, Min assist Gait Distance (Feet): 110 Feet Assistive device: Rolling walker (2 wheels) Gait Pattern/deviations: Trunk flexed, Shuffle, Decreased weight shift to right General Gait Details: cues for increased RLE clearance throughout and upright posture as pt with tendency for downward gaze Gait velocity: decr Gait velocity interpretation: <1.31 ft/sec, indicative of household ambulator Pre-gait activities:  marching in place, 10x R and 10x L   ADL: ADL Overall ADL's : Needs assistance/impaired Eating/Feeding: Set up, Sitting Grooming: Wash/dry hands, Wash/dry face, Oral care, Brushing hair, Contact guard assist, Standing, Cueing for sequencing Grooming Details (indicate cue type and reason): required cues for sequencing Upper Body Bathing: Set up, Cueing for sequencing, Standing Lower Body Bathing: Moderate assistance, Sit to/from stand, Sitting/lateral leans Upper Body Dressing : Supervision/safety, Sitting Lower Body Dressing: Moderate assistance, Maximal assistance, Sitting/lateral leans, Sit to/from stand Lower Body Dressing Details (indicate cue type and reason): able to doff socks, unable to [employer] Transfer: Minimal assistance, Rolling walker (2 wheels) Toilet Transfer Details (indicate cue type and reason): simulated Toileting- Clothing Manipulation and Hygiene: Moderate assistance, Sit to/from stand, Sitting/lateral lean Functional mobility during ADLs: Moderate assistance, +2 for physical assistance, +2 for safety/equipment, Cueing for sequencing General ADL Comments: making good gains with self care tasks   Cognition: Cognition Overall Cognitive Status: Impaired/Different from baseline Arousal/Alertness: Awake/alert Orientation Level: Oriented to person, Oriented to place Attention:  (sustained attention appeared [facility] for tasks) Awareness: Impaired Awareness Impairment: Intellectual impairment, Emergent impairment Problem Solving: Impaired Problem Solving Impairment: Functional basic Safety/Judgment: Impaired Cognition Arousal: Alert Behavior During Therapy: Flat affect Overall Cognitive Status: Impaired/Different from baseline Area of Impairment: Orientation, Attention, Memory, Following commands, Safety/judgement, Awareness, Problem solving Orientation Level: Disoriented to, Time, Situation, Place Current Attention Level: Sustained Memory: Decreased short-term  memory Following Commands: Follows one step commands consistently Safety/Judgement: Decreased awareness of deficits Awareness: Emergent Problem Solving: Slow processing, Difficulty sequencing, Requires verbal cues General Comments: stating 1977 and no guess as to location, stating "im here to get help" once re-oriented to hospital      Blood pressure (!) 196/93, pulse 69, temperature 99.7 F (37.6 C), temperature source Oral, resp. rate 18, height 6' (1.829 m), weight 87.2 kg, SpO2 98%.  Physical Exam Vitals and nursing note reviewed.  Constitutional:      Appearance: Normal appearance.  Cardiovascular:     Rate and Rhythm: Rhythm irregular.  Pulm: breathing comfortably on room air Abdomen: NT, ND Skin:    General: Skin is warm and dry.     Findings: Bruising present.  Neurological:     Mental Status: He is alert.     Comments: Oriented to self and DOB. Mild right facial droop. He was unable to recall age, details leading to admission, unable to recall month, year, season or hospital. Expressive deficits--was unable to describe being in the hospital. He was able to follow simple motor commands.   Psych: pleasant     Results for orders placed or performed during the hospital encounter of 07/08/23 (from the past 48 hour(s))  CBC     Status: None   Collection Time: 07/10/23  5:22 AM  Result Value Ref Range   WBC 6.6 4.0 - 10.5 K/uL   RBC 4.96 4.22 - 5.81 MIL/uL   Hemoglobin 16.1 13.0 - 17.0 g/dL   HCT 16.1 09.6 - 04.5 %  MCV 95.0 80.0 - 100.0 fL   MCH 32.5 26.0 - 34.0 pg   MCHC 34.2 30.0 - 36.0 g/dL   RDW 24.4 01.0 - 27.2 %   Platelets 154 150 - 400 K/uL   nRBC 0.0 0.0 - 0.2 %    Comment: Performed at [facility], [address] [place], [place] [address]  Basic metabolic panel     Status: Abnormal   Collection Time: 07/10/23  5:22 AM  Result Value Ref Range   Sodium 139 135 - 145 mmol/L   Potassium 3.7 3.5 - 5.1 mmol/L   Chloride 104 98 - 111 mmol/L   CO2 22  22 - 32 mmol/L   Glucose, Bld 98 70 - 99 mg/dL    Comment: Glucose reference range applies only to samples taken after fasting for at least 8 hours.   BUN 31 (H) 8 - 23 mg/dL   Creatinine, Ser 4.03 0.61 - 1.24 mg/dL   Calcium 8.8 (L) 8.9 - 10.3 mg/dL   GFR, Estimated >47 >42 mL/min    Comment: (NOTE) Calculated using the CKD-EPI Creatinine Equation (2021)    Anion gap 13 5 - 15    Comment: Performed at [facility], [address], [place], [place] [address]   No results found.    Blood pressure (!) 196/93, pulse 69, temperature 99.7 F (37.6 C), temperature source Oral, resp. rate 18, height 6' (1.829 m), weight 87.2 kg, SpO2 98%.  Medical Problem List and Plan: 1. Functional deficits secondary to acute left internal carotid artery CVA  -patient may shower  -ELOS/Goals: 5-7 days S  Admit to CIR 2.  Antithrombotics: -DVT/anticoagulation:  Pharmaceutical: Other (comment)--Pradaxa  -antiplatelet therapy: N/A 3. Pain Management: Intolereant of tylenol.  4. Mood/Behavior/Sleep: LCSW to follow for evaluation and support.   -antipsychotic agents: N/A  --will add melatonin prn for occasional insomnia 5. Neuropsych/cognition: This patient is not  capable of making decisions on his own behalf. 6. Skin/Wound Care: Routine pressure relief measures.  7. Fluids/Electrolytes/Nutrition:  Monitor I/O. Check CMET in am 8. A fib: Monitor HR TID--continue metoprolol and Pradaxa 9. T2DM w/neuropathy: Hgb A1c-7.1. Will start monitoring  BS ac/hs.  --Use SSI for elevated BS. Monitor intake  --Was on jardiance, Amaryl and metformin PTA  10. Pre-renal azotemia: BUN trending up 26-->31. Encourage fluid intake. Placed nursing order to encourage 6-8 glasses of water per day  11. Chronic LBP/neck as well as hip, knee, shoulder: Voltaren gel added to knees and right shoulder  --monitor for symptoms with increase in activity.  Kpad ordered  12. Screening for vitamin D deficiency: check vitamin  D level tomorrow   I have personally performed a face to face diagnostic evaluation, including, but not limited to relevant history and physical exam findings, of this patient and developed relevant assessment and plan.  Additionally, I have reviewed and concur with the physician assistant's documentation above.  Jacquelynn Cree, PA-C   Horton Chin, MD 07/11/2023

## 2023-07-11 NOTE — Progress Notes (Signed)
INPATIENT REHABILITATION ADMISSION NOTE   Arrival Method: Bed     Mental Orientation: Alert and Oriented to self   Assessment: done   Skin: assessed   IV'S: removed   Pain: none   Tubes and Drains: none   Safety Measures: reviewed with pt and family   Vital Signs: done   Height and Weight: done   Rehab Orientation: done   Family: at bedside    Notes: done  Marylu Lund, RN

## 2023-07-11 NOTE — Discharge Summary (Addendum)
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, TICI 3 revascularization  Discharge Diagnoses:  Principal Problem:   Stroke [facility]) Dysarthria aphasia Active Problems:   Chronic atrial fibrillation (HCC)   DM (diabetes mellitus), type 2 with complications (HCC)   Hypercholesterolemia   Essential (primary) hypertension   Coronary artery disease   Essential tremor   Diabetic neuropathy (HCC)   Medications to be continued on Rehab Allergies as of 07/11/2023       Reactions   Acetaminophen         Medication List     STOP taking these medications    empagliflozin 25 MG Tabs tablet Commonly known as: JARDIANCE   glimepiride 2 MG tablet Commonly known as: AMARYL   metFORMIN 500 MG tablet Commonly known as: GLUCOPHAGE   methocarbamol 500 MG tablet Commonly known as: ROBAXIN       TAKE these medications    amLODipine 10 MG tablet Commonly known as: NORVASC Take 1 tablet (10 mg total) by mouth daily. Start taking on: July 12, 2023   dabigatran 150 MG Caps capsule Commonly known as: PRADAXA Take 1 capsule (150 mg total) by mouth every 12 (twelve) hours.   gabapentin 100 MG capsule Commonly known as: NEURONTIN Take 100 mg by mouth daily.   metoprolol succinate 100 MG 24 hr tablet Commonly known as: TOPROL-XL Take 1 tablet (100 mg total) by mouth daily. Take with or immediately following a meal. Start taking on: July 12, 2023 What changed: when to take this   rosuvastatin 10 MG tablet Commonly known as: CRESTOR Take 1 tablet (10 mg total) by mouth daily. Start taking on: July 12, 2023 What changed:  medication  strength additional instructions   senna-docusate 8.6-50 MG tablet Commonly known as: Senokot-S Take 1 tablet by mouth at bedtime as needed for mild constipation.        LABORATORY STUDIES CBC    Component Value Date/Time   WBC 6.6 07/10/2023 0522   RBC 4.96 07/10/2023 0522   HGB 16.1 07/10/2023 0522   HCT 47.1 07/10/2023 0522   PLT 154 07/10/2023 0522   MCV 95.0 07/10/2023 0522   MCH 32.5 07/10/2023 0522   MCHC 34.2 07/10/2023 0522   RDW 13.2 07/10/2023 0522   LYMPHSABS 0.9 07/08/2023 1052   MONOABS 0.9 07/08/2023 1052   EOSABS 0.0 07/08/2023 1052   BASOSABS 0.1 07/08/2023 1052   CMP    Component Value Date/Time   NA 139 07/10/2023 0522   K 3.7 07/10/2023 0522   CL 104 07/10/2023 0522   CO2 22 07/10/2023 0522   GLUCOSE 98 07/10/2023 0522   BUN 31 (H) 07/10/2023 0522   CREATININE 1.17 07/10/2023 0522   CALCIUM 8.8 (L) 07/10/2023 0522   PROT 7.4 12/06/2021 0438   ALBUMIN 4.2 12/06/2021 0438   AST 28 12/06/2021 0438   ALT 29 12/06/2021 0438   ALKPHOS 85 12/06/2021 0438   BILITOT 1.1 12/06/2021 0438   GFRNONAA >60 07/10/2023 0522   GFRAA >90 01/07/2014 1900   COAGS Lab Results  Component Value Date   INR 1.2 07/08/2023   INR 1.1 12/05/2021   INR  1.2 03/22/2008   Lipid Panel    Component Value Date/Time   CHOL 88 07/09/2023 0718   TRIG 74 07/09/2023 0718   HDL 34 (L) 07/09/2023 0718   CHOLHDL 2.6 07/09/2023 0718   VLDL 15 07/09/2023 0718   LDLCALC 39 07/09/2023 0718   HgbA1C  Lab Results  Component Value Date   HGBA1C 7.1 (H) 07/08/2023   Urinalysis    Component Value Date/Time   COLORURINE YELLOW 07/08/2023 2043   APPEARANCEUR CLEAR 07/08/2023 2043   LABSPEC >1.046 (H) 07/08/2023 2043   PHURINE 6.0 07/08/2023 2043   GLUCOSEU >=500 (A) 07/08/2023 2043   HGBUR NEGATIVE 07/08/2023 2043   BILIRUBINUR NEGATIVE 07/08/2023 2043   KETONESUR 80 (A) 07/08/2023 2043   PROTEINUR 30 (A) 07/08/2023 2043   UROBILINOGEN 0.2 01/07/2014 1905   NITRITE  NEGATIVE 07/08/2023 2043   LEUKOCYTESUR NEGATIVE 07/08/2023 2043   Urine Drug Screen     Component Value Date/Time   LABOPIA NONE DETECTED 07/08/2023 2043   COCAINSCRNUR NONE DETECTED 07/08/2023 2043   LABBENZ NONE DETECTED 07/08/2023 2043   AMPHETMU NONE DETECTED 07/08/2023 2043   THCU NONE DETECTED 07/08/2023 2043   LABBARB NONE DETECTED 07/08/2023 2043    Alcohol Level    Component Value Date/Time   ETH <10 07/08/2023 1555     SIGNIFICANT DIAGNOSTIC STUDIES IR PERCUTANEOUS ART THROMBECTOMY/INFUSION INTRACRANIAL INC DIAG ANGIO  Result Date: 07/10/2023 INDICATION: Cory Brown is a 72 year old male presenting with right-sided weakness and slurred speech; NIHSS 12. His last known well was 9:30 p.m. on 07/07/2023. His past medical history significant for prior left MCA stroke status post mechanical thrombectomy, A-fib with pacemaker on Pradaxa; baseline modified Rankin scale 1. Head CT showed no large acute infarct or hemorrhage. CT angiogram of the head and neck showed occlusion of the left ICA near the terminus. Was transferred to our service or mechanical thrombectomy. EXAM: ULTRASOUND-GUIDED VASCULAR ACCESS DIAGNOSTIC CEREBRAL ANGIOGRAM MECHANICAL THROMBECTOMY FLAT PANEL HEAD CT COMPARISON:  CT/CT angiogram of head and neck July 08, 2023. MEDICATIONS: No antibiotics administered. ANESTHESIA/SEDATION: The procedure was performed under general anesthesia. CONTRAST:  50 mL of Omnipaque 300 milligram/mL. FLUOROSCOPY: Radiation Exposure Index (as provided by the fluoroscopic device): 706 mGy Kerma COMPLICATIONS: None immediate. TECHNIQUE: Informed written consent was obtained from the patient's son after a thorough discussion of the procedural risks, benefits and alternatives. All questions were addressed. Maximal Sterile Barrier Technique was utilized including caps, mask, sterile gowns, sterile gloves, sterile drape, hand hygiene and skin antiseptic. A timeout was performed prior to the  initiation of the procedure. The right groin was prepped and draped in the usual sterile fashion. Using a micropuncture kit and the modified Seldinger technique, access was gained to the right common femoral artery and an 8 French sheath was placed. Real-time ultrasound guidance was utilized for vascular access including the acquisition of a permanent ultrasound image documenting patency of the accessed vessel. Under fluoroscopy, a Zoom 88 guide catheter was navigated over a 6 [place] Berenstein 2 catheter and a 0.035" Terumo Glidewire into the aortic arch. The catheter was placed into the left common carotid artery and then advanced into the left internal carotid artery. The diagnostic catheter was removed. Frontal and lateral angiograms of the head were obtained. FINDINGS: 1. Normal caliber of the right common femoral artery, adequate for vascular access. 2. Occlusion of the intracranial left internal carotid artery at the cavernous segment with clot extending into the petrous segment. PROCEDURE: Using biplane roadmap guidance, a Cereglide aspiration  catheter was navigated into the cavernous segment of the left ICA. The aspiration catheter was then advanced through occlusion to the level of the left M1/MCA while connected to an aspiration pump. Continuous aspiration was performed for 2 minutes. The guide catheter was then advanced over the aspiration catheter into the left ICA terminus while connected to a VacLok syringe. The aspiration catheter was subsequently removed under constant aspiration. The guide catheter was also aspirated until clear flow was seen. Left internal carotid artery angiograms with frontal and lateral views of the head showed residual clot in the cavernous segment with contrast penetration into the ICA terminus which appear patent with no flow seen the on the M1 segment. 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 Using biplane roadmap guidance, a zoom 71 aspiration catheter was navigated  into the cavernous segment of the left ICA. The aspiration catheter was then advanced through occlusion to the level of the left M1/MCA while connected to an aspiration pump. Continuous aspiration was performed for 2 minutes. The guide catheter was then advanced over the aspiration catheter into the left ICA terminus while connected to a VacLok syringe. The aspiration catheter was subsequently removed under constant aspiration. The guide catheter was also aspirated until clear flow was seen. Left internal carotid artery angiograms with frontal and lateral views of the head showed recanalization of the left ICA with occlusion of the M1 segment. Using biplane roadmap guidance, a zoom 71 aspiration catheter was navigated over Colossus 35 microguidewire into the cavernous segment of the left ICA. The aspiration catheter was then advanced to the level of occlusion and connected to an aspiration pump. Continuous aspiration was performed for 2 minutes. The guide catheter was connected to a VacLok syringe. The aspiration catheter was subsequently removed under constant aspiration. The guide catheter was aspirated for debris. Left internal carotid artery angiograms with frontal and lateral views of the head showed complete recanalization of the left MCA vascular tree (TICI3). Flat panel CT of the head was obtained and post processed in a separate workstation with concurrent attending physician supervision. Selected images were sent to PACS. No evidence of hemorrhagic complication. The catheter was subsequently withdrawn. Right common femoral artery angiogram was obtained in right anterior oblique view. The puncture is at the level of the common femoral artery. The artery has normal caliber, adequate for closure device. The sheath was exchanged over the wire for a Perclose prostyle which was utilized for access closure. Immediate hemostasis was achieved. IMPRESSION: Successful and uncomplicated mechanical thrombectomy for  treatment of an intracranial left ICA occlusion achieving complete recanalization (T3). No thromboembolic or hemorrhagic complication. PLAN: Transfer to ICU. Electronically Signed   By: Baldemar Lenis M.D.   On: 07/10/2023 12:14   IR CT Head Ltd  Result Date: 07/10/2023 INDICATION: Cory Brown is a 72 year old male presenting with right-sided weakness and slurred speech; NIHSS 12. His last known well was 9:30 p.m. on 07/07/2023. His past medical history significant for prior left MCA stroke status post mechanical thrombectomy, A-fib with pacemaker on Pradaxa; baseline modified Rankin scale 1. Head CT showed no large acute infarct or hemorrhage. CT angiogram of the head and neck showed occlusion of the left ICA near the terminus. Was transferred to our service or mechanical thrombectomy. EXAM: ULTRASOUND-GUIDED VASCULAR ACCESS DIAGNOSTIC CEREBRAL ANGIOGRAM MECHANICAL THROMBECTOMY FLAT PANEL HEAD CT COMPARISON:  CT/CT angiogram of head and neck July 08, 2023. MEDICATIONS: No antibiotics administered. ANESTHESIA/SEDATION: The procedure was performed under general anesthesia. CONTRAST:  50  mL of Omnipaque 300 milligram/mL. FLUOROSCOPY: Radiation Exposure Index (as provided by the fluoroscopic device): 706 mGy Kerma COMPLICATIONS: None immediate. TECHNIQUE: Informed written consent was obtained from the patient's son after a thorough discussion of the procedural risks, benefits and alternatives. All questions were addressed. Maximal Sterile Barrier Technique was utilized including caps, mask, sterile gowns, sterile gloves, sterile drape, hand hygiene and skin antiseptic. A timeout was performed prior to the initiation of the procedure. The right groin was prepped and draped in the usual sterile fashion. Using a micropuncture kit and the modified Seldinger technique, access was gained to the right common femoral artery and an 8 French sheath was placed. Real-time ultrasound guidance was utilized  for vascular access including the acquisition of a permanent ultrasound image documenting patency of the accessed vessel. Under fluoroscopy, a Zoom 88 guide catheter was navigated over a 6 [place] Berenstein 2 catheter and a 0.035" Terumo Glidewire into the aortic arch. The catheter was placed into the left common carotid artery and then advanced into the left internal carotid artery. The diagnostic catheter was removed. Frontal and lateral angiograms of the head were obtained. FINDINGS: 1. Normal caliber of the right common femoral artery, adequate for vascular access. 2. Occlusion of the intracranial left internal carotid artery at the cavernous segment with clot extending into the petrous segment. PROCEDURE: Using biplane roadmap guidance, a Cereglide aspiration catheter was navigated into the cavernous segment of the left ICA. The aspiration catheter was then advanced through occlusion to the level of the left M1/MCA while connected to an aspiration pump. Continuous aspiration was performed for 2 minutes. The guide catheter was then advanced over the aspiration catheter into the left ICA terminus while connected to a VacLok syringe. The aspiration catheter was subsequently removed under constant aspiration. The guide catheter was also aspirated until clear flow was seen. Left internal carotid artery angiograms with frontal and lateral views of the head showed residual clot in the cavernous segment with contrast penetration into the ICA terminus which appear patent with no flow seen the on the M1 segment. 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 Using biplane roadmap guidance, a zoom 71 aspiration catheter was navigated into the cavernous segment of the left ICA. The aspiration catheter was then advanced through occlusion to the level of the left M1/MCA while connected to an aspiration pump. Continuous aspiration was performed for 2 minutes. The guide catheter was then advanced over the aspiration catheter  into the left ICA terminus while connected to a VacLok syringe. The aspiration catheter was subsequently removed under constant aspiration. The guide catheter was also aspirated until clear flow was seen. Left internal carotid artery angiograms with frontal and lateral views of the head showed recanalization of the left ICA with occlusion of the M1 segment. Using biplane roadmap guidance, a zoom 71 aspiration catheter was navigated over Colossus 35 microguidewire into the cavernous segment of the left ICA. The aspiration catheter was then advanced to the level of occlusion and connected to an aspiration pump. Continuous aspiration was performed for 2 minutes. The guide catheter was connected to a VacLok syringe. The aspiration catheter was subsequently removed under constant aspiration. The guide catheter was aspirated for debris. Left internal carotid artery angiograms with frontal and lateral views of the head showed complete recanalization of the left MCA vascular tree (TICI3). Flat panel CT of the head was obtained and post processed in a separate workstation with concurrent attending physician supervision. Selected images were sent to PACS. No evidence  of hemorrhagic complication. The catheter was subsequently withdrawn. Right common femoral artery angiogram was obtained in right anterior oblique view. The puncture is at the level of the common femoral artery. The artery has normal caliber, adequate for closure device. The sheath was exchanged over the wire for a Perclose prostyle which was utilized for access closure. Immediate hemostasis was achieved. IMPRESSION: Successful and uncomplicated mechanical thrombectomy for treatment of an intracranial left ICA occlusion achieving complete recanalization (T3). No thromboembolic or hemorrhagic complication. PLAN: Transfer to ICU. Electronically Signed   By: Baldemar Lenis M.D.   On: 07/10/2023 12:14   IR US Guide Vasc Access Right  Result Date:  07/10/2023 INDICATION: Cory Brown is a 72 year old male presenting with right-sided weakness and slurred speech; NIHSS 12. His last known well was 9:30 p.m. on 07/07/2023. His past medical history significant for prior left MCA stroke status post mechanical thrombectomy, A-fib with pacemaker on Pradaxa; baseline modified Rankin scale 1. Head CT showed no large acute infarct or hemorrhage. CT angiogram of the head and neck showed occlusion of the left ICA near the terminus. Was transferred to our service or mechanical thrombectomy. EXAM: ULTRASOUND-GUIDED VASCULAR ACCESS DIAGNOSTIC CEREBRAL ANGIOGRAM MECHANICAL THROMBECTOMY FLAT PANEL HEAD CT COMPARISON:  CT/CT angiogram of head and neck July 08, 2023. MEDICATIONS: No antibiotics administered. ANESTHESIA/SEDATION: The procedure was performed under general anesthesia. CONTRAST:  50 mL of Omnipaque 300 milligram/mL. FLUOROSCOPY: Radiation Exposure Index (as provided by the fluoroscopic device): 706 mGy Kerma COMPLICATIONS: None immediate. TECHNIQUE: Informed written consent was obtained from the patient's son after a thorough discussion of the procedural risks, benefits and alternatives. All questions were addressed. Maximal Sterile Barrier Technique was utilized including caps, mask, sterile gowns, sterile gloves, sterile drape, hand hygiene and skin antiseptic. A timeout was performed prior to the initiation of the procedure. The right groin was prepped and draped in the usual sterile fashion. Using a micropuncture kit and the modified Seldinger technique, access was gained to the right common femoral artery and an 8 French sheath was placed. Real-time ultrasound guidance was utilized for vascular access including the acquisition of a permanent ultrasound image documenting patency of the accessed vessel. Under fluoroscopy, a Zoom 88 guide catheter was navigated over a 6 [place] Berenstein 2 catheter and a 0.035" Terumo Glidewire into the aortic arch. The  catheter was placed into the left common carotid artery and then advanced into the left internal carotid artery. The diagnostic catheter was removed. Frontal and lateral angiograms of the head were obtained. FINDINGS: 1. Normal caliber of the right common femoral artery, adequate for vascular access. 2. Occlusion of the intracranial left internal carotid artery at the cavernous segment with clot extending into the petrous segment. PROCEDURE: Using biplane roadmap guidance, a Cereglide aspiration catheter was navigated into the cavernous segment of the left ICA. The aspiration catheter was then advanced through occlusion to the level of the left M1/MCA while connected to an aspiration pump. Continuous aspiration was performed for 2 minutes. The guide catheter was then advanced over the aspiration catheter into the left ICA terminus while connected to a VacLok syringe. The aspiration catheter was subsequently removed under constant aspiration. The guide catheter was also aspirated until clear flow was seen. Left internal carotid artery angiograms with frontal and lateral views of the head showed residual clot in the cavernous segment with contrast penetration into the ICA terminus which appear patent with no flow seen the on the M1 segment. Incidentally noted 2 mm carotid  cave aneurysm. Using biplane roadmap guidance, a zoom 71 aspiration catheter was navigated into the cavernous segment of the left ICA. The aspiration catheter was then advanced through occlusion to the level of the left M1/MCA while connected to an aspiration pump. Continuous aspiration was performed for 2 minutes. The guide catheter was then advanced over the aspiration catheter into the left ICA terminus while connected to a VacLok syringe. The aspiration catheter was subsequently removed under constant aspiration. The guide catheter was also aspirated until clear flow was seen. Left internal carotid artery angiograms with frontal and lateral views  of the head showed recanalization of the left ICA with occlusion of the M1 segment. Using biplane roadmap guidance, a zoom 71 aspiration catheter was navigated over Colossus 35 microguidewire into the cavernous segment of the left ICA. The aspiration catheter was then advanced to the level of occlusion and connected to an aspiration pump. Continuous aspiration was performed for 2 minutes. The guide catheter was connected to a VacLok syringe. The aspiration catheter was subsequently removed under constant aspiration. The guide catheter was aspirated for debris. Left internal carotid artery angiograms with frontal and lateral views of the head showed complete recanalization of the left MCA vascular tree (TICI3). Flat panel CT of the head was obtained and post processed in a separate workstation with concurrent attending physician supervision. Selected images were sent to PACS. No evidence of hemorrhagic complication. The catheter was subsequently withdrawn. Right common femoral artery angiogram was obtained in right anterior oblique view. The puncture is at the level of the common femoral artery. The artery has normal caliber, adequate for closure device. The sheath was exchanged over the wire for a Perclose prostyle which was utilized for access closure. Immediate hemostasis was achieved. IMPRESSION: Successful and uncomplicated mechanical thrombectomy for treatment of an intracranial left ICA occlusion achieving complete recanalization (T3). No thromboembolic or hemorrhagic complication. PLAN: Transfer to ICU. Electronically Signed   By: Baldemar Lenis M.D.   On: 07/10/2023 12:14   MR BRAIN WO CONTRAST  Result Date: 07/09/2023 CLINICAL DATA:  Stroke, follow up. EXAM: MRI HEAD WITHOUT CONTRAST MRA HEAD WITHOUT CONTRAST TECHNIQUE: Multiplanar, multi-echo pulse sequences of the brain and surrounding structures were acquired without intravenous contrast. Angiographic images of the Circle of Willis were  acquired using MRA technique without intravenous contrast. COMPARISON:  CTA head/neck 07/08/2023. FINDINGS: MRI HEAD FINDINGS Brain: Scattered foci of acute infarction throughout the left MCA and bilateral ACA territories. No acute hemorrhage or significant mass effect. Unchanged encephalomalacia along the left superior temporal lobe, left frontal operculum, and right middle frontal gyrus from prior infarcts. Old lacunar infarcts in the bilateral basal ganglia and thalami. No hydrocephalus or extra-axial collection. Vascular: See below. Skull and upper cervical spine: Normal marrow signal. Sinuses/Orbits: No acute findings. Other: None. MRA HEAD FINDINGS Anterior circulation: Intracranial ICAs are patent without stenosis or aneurysm. The proximal ACAs and MCAs are patent without stenosis or aneurysm. Distal branches are symmetric. Posterior circulation: Normal basilar artery. The SCAs, AICAs and PICAs are patent proximally. The PCAs are patent proximally without stenosis or aneurysm. Distal branches are symmetric. Anatomic variants: None. IMPRESSION: 1. Scattered foci of acute infarction throughout the left MCA and bilateral ACA territories. No acute hemorrhage or significant mass effect. 2. No large vessel occlusion or high-grade stenosis in the head. Electronically Signed   By: Orvan Falconer M.D.   On: 07/09/2023 17:43   MR ANGIO HEAD WO CONTRAST  Result Date: 07/09/2023 CLINICAL DATA:  Stroke,  follow up. EXAM: MRI HEAD WITHOUT CONTRAST MRA HEAD WITHOUT CONTRAST TECHNIQUE: Multiplanar, multi-echo pulse sequences of the brain and surrounding structures were acquired without intravenous contrast. Angiographic images of the Circle of Willis were acquired using MRA technique without intravenous contrast. COMPARISON:  CTA head/neck 07/08/2023. FINDINGS: MRI HEAD FINDINGS Brain: Scattered foci of acute infarction throughout the left MCA and bilateral ACA territories. No acute hemorrhage or significant mass effect.  Unchanged encephalomalacia along the left superior temporal lobe, left frontal operculum, and right middle frontal gyrus from prior infarcts. Old lacunar infarcts in the bilateral basal ganglia and thalami. No hydrocephalus or extra-axial collection. Vascular: See below. Skull and upper cervical spine: Normal marrow signal. Sinuses/Orbits: No acute findings. Other: None. MRA HEAD FINDINGS Anterior circulation: Intracranial ICAs are patent without stenosis or aneurysm. The proximal ACAs and MCAs are patent without stenosis or aneurysm. Distal branches are symmetric. Posterior circulation: Normal basilar artery. The SCAs, AICAs and PICAs are patent proximally. The PCAs are patent proximally without stenosis or aneurysm. Distal branches are symmetric. Anatomic variants: None. IMPRESSION: 1. Scattered foci of acute infarction throughout the left MCA and bilateral ACA territories. No acute hemorrhage or significant mass effect. 2. No large vessel occlusion or high-grade stenosis in the head. Electronically Signed   By: Orvan Falconer M.D.   On: 07/09/2023 17:43   ECHOCARDIOGRAM COMPLETE  Result Date: 07/09/2023    ECHOCARDIOGRAM REPORT   Patient Name:   Cory Brown Date of Exam: 07/09/2023 Medical Rec #:  [id number]     Height:       73.0 in Accession #:    [id number]    Weight:       208.3 lb Date of Birth:  [birth year]     BSA:          2.189 m Patient Age:    72 years      BP:           137/63 mmHg Patient Gender: M             HR:           71 bpm. Exam Location:  Inpatient Procedure: 2D Echo, Cardiac Doppler and Color Doppler Indications:    Stroke I63.9  History:        Patient has prior history of Echocardiogram examinations, most                 recent 12/06/2021. Stroke; Risk Factors:Diabetes, Hypertension,                 Dyslipidemia and Non-Smoker.  Sonographer:    Dondra Prader RVT RCS Referring Phys: [id number] [facility]  Sonographer Comments: Suboptimal subcostal window. IMPRESSIONS  1. Left ventricular  ejection fraction, by estimation, is 60 to 65%. The left ventricle has normal function. The left ventricle has no regional wall motion abnormalities. There is mild concentric left ventricular hypertrophy. Left ventricular diastolic parameters are indeterminate.  2. Right ventricular systolic function is normal. The right ventricular size is normal. There is mildly elevated pulmonary artery systolic pressure.  3. Left atrial size was severely dilated.  4. Right atrial size was severely dilated.  5. The mitral valve is grossly normal. Trivial mitral valve regurgitation.  6. Tricuspid valve regurgitation is mild to moderate.  7. The aortic valve is tricuspid. There is mild calcification of the aortic valve. Aortic valve regurgitation is not visualized.  8. The inferior vena cava is normal in size with greater than 50% respiratory variability, suggesting right  atrial pressure of 3 mmHg. Conclusion(s)/Recommendation(s): No intracardiac source of embolism detected on this transthoracic study. Consider a transesophageal echocardiogram to exclude cardiac source of embolism if clinically indicated. FINDINGS  Left Ventricle: Left ventricular ejection fraction, by estimation, is 60 to 65%. The left ventricle has normal function. The left ventricle has no regional wall motion abnormalities. The left ventricular internal cavity size was normal in size. There is  mild concentric left ventricular hypertrophy. Left ventricular diastolic parameters are indeterminate. Right Ventricle: The right ventricular size is normal. Right ventricular systolic function is normal. There is mildly elevated pulmonary artery systolic pressure. The tricuspid regurgitant velocity is 3.15 m/s, and with an assumed right atrial pressure of 3 mmHg, the estimated right ventricular systolic pressure is 42.7 mmHg. Left Atrium: Left atrial size was severely dilated. Right Atrium: Right atrial size was severely dilated. Pericardium: There is no evidence of  pericardial effusion. Mitral Valve: The mitral valve is grossly normal. Trivial mitral valve regurgitation. Tricuspid Valve: The tricuspid valve is normal in structure. Tricuspid valve regurgitation is mild to moderate. Aortic Valve: The aortic valve is tricuspid. There is mild calcification of the aortic valve. Aortic valve regurgitation is not visualized. Aortic valve mean gradient measures 8.0 mmHg. Aortic valve peak gradient measures 14.0 mmHg. Aortic valve area, by  VTI measures 1.66 cm. Pulmonic Valve: Pulmonic valve regurgitation is not visualized. Aorta: The aortic root and ascending aorta are structurally normal, with no evidence of dilitation. Venous: The inferior vena cava is normal in size with greater than 50% respiratory variability, suggesting right atrial pressure of 3 mmHg. IAS/Shunts: No atrial level shunt detected by color flow Doppler. Additional Comments: A device lead is visualized.  LEFT VENTRICLE PLAX 2D LVIDd:         4.60 cm LVIDs:         3.30 cm LV PW:         1.20 cm LV IVS:        1.20 cm LVOT diam:     1.90 cm LV SV:         59 LV SV Index:   27 LVOT Area:     2.84 cm  RIGHT VENTRICLE RV Basal diam:  4.60 cm RV Mid diam:    3.60 cm RV S prime:     17.30 cm/s TAPSE (M-mode): 1.7 cm LEFT ATRIUM              Index        RIGHT ATRIUM           Index LA diam:        4.80 cm  2.19 cm/m   RA Area:     28.80 cm LA Vol (A2C):   124.0 ml 56.64 ml/m  RA Volume:   97.90 ml  44.72 ml/m LA Vol (A4C):   81.5 ml  37.23 ml/m LA Biplane Vol: 123.0 ml 56.18 ml/m  AORTIC VALVE                     PULMONIC VALVE AV Area (Vmax):    1.79 cm      PV Vmax:       1.46 m/s AV Area (Vmean):   1.76 cm      PV Peak grad:  8.6 mmHg AV Area (VTI):     1.66 cm AV Vmax:           187.00 cm/s AV Vmean:          129.000 cm/s  AV VTI:            0.353 m AV Peak Grad:      14.0 mmHg AV Mean Grad:      8.0 mmHg LVOT Vmax:         118.00 cm/s LVOT Vmean:        79.900 cm/s LVOT VTI:          0.207 m LVOT/AV VTI  ratio: 0.59  AORTA Ao Root diam: 3.10 cm Ao Asc diam:  3.80 cm MITRAL VALVE                TRICUSPID VALVE MV Area (PHT): 3.60 cm     TR Peak grad:   39.7 mmHg MV Decel Time: 211 msec     TR Vmax:        315.00 cm/s MV E velocity: 116.00 cm/s MV A velocity: 29.00 cm/s   SHUNTS MV E/A ratio:  4.00         Systemic VTI:  0.21 m                             Systemic Diam: 1.90 cm Carolan Clines Electronically signed by Carolan Clines Signature Date/Time: 07/09/2023/12:01:57 PM    Final    DG Shoulder Right Port  Result Date: 07/09/2023 CLINICAL DATA:  Pain from fall. EXAM: RIGHT SHOULDER - 1 VIEW COMPARISON:  Right shoulder radiographs 01/02/2023 FINDINGS: Moderate glenohumeral joint space narrowing with mild inferior glenoid degenerative osteophytes, similar to prior. Moderate acromioclavicular joint space narrowing and inferior greater than superior osteophytes, similar to prior. Mild distal lateral broad-based subacromial spurring. No acute fracture or dislocation. IMPRESSION: Moderate glenohumeral and acromioclavicular osteoarthritis, similar to prior. Electronically Signed   By: Neita Garnet M.D.   On: 07/09/2023 10:46   DG Knee Right Port  Result Date: 07/09/2023 CLINICAL DATA:  Pain from fall. EXAM: PORTABLE RIGHT KNEE - 1-2 VIEW COMPARISON:  None Available. FINDINGS: Mildly decreased bone mineralization. Mild-to-moderate medial compartment joint space narrowing with minimal peripheral osteophytosis. Small joint effusion. Mild superior and minimal inferior patellar degenerative spurring. Minimal chronic enthesopathic change at the quadriceps insertion on the patella. There is calcification measuring approximately 1.5 cm in length overlying the proximal medial collateral ligament, greatest at the mid height of the medial femoral condyle. This likely reflects the sequela of remote injury to the medial collateral ligament. No acute fracture is seen.  No dislocation. IMPRESSION: 1. No acute fracture. 2.  Mild-to-moderate medial compartment osteoarthritis. 3. Small joint effusion. 4. Calcification overlying the proximal medial collateral ligament, likely the sequela of remote medial collateral ligament injury. Electronically Signed   By: Neita Garnet M.D.   On: 07/09/2023 10:44   CT HEAD CODE STROKE WO CONTRAST  Addendum Date: 07/08/2023   ADDENDUM REPORT: 07/08/2023 11:36 ADDENDUM: Findings were discussed with Dr. Viviann Spare on 07/08/23 at 11:22 AM. Electronically Signed   By: Lorenza Cambridge M.D.   On: 07/08/2023 11:36   Result Date: 07/08/2023 CLINICAL DATA:  Code stroke.  Neuro deficit, acute, stroke suspected EXAM: CT HEAD WITHOUT CONTRAST TECHNIQUE: Contiguous axial images were obtained from the base of the skull through the vertex without intravenous contrast. RADIATION DOSE REDUCTION: This exam was performed according to the departmental dose-optimization program which includes automated exposure control, adjustment of the mA and/or kV according to patient size and/or use of iterative reconstruction technique. COMPARISON:  CT head 01/08/23 FINDINGS: Brain: There is a nonspecific rounded hyperdensity  in the left thalamus measuring 5 mm, which could represent chronic left thalamic hemorrhage. Age indeterminate infarcts in the left basal ganglia. No hydrocephalus. No extra-axial fluid collection. Redemonstrated chronic left MCA territory infarct unchanged compared to prior exam. There is also a chronic right frontal lobe infarct, unchanged. No CT evidence of a new cortical infarct. There is a chronic right thalamic infarct Vascular: No hyperdense vessel or unexpected calcification. Skull: Normal. Negative for fracture or focal lesion. Sinuses/Orbits: No middle ear or mastoid effusion. Paranasal sinuses are clear. Orbits are unremarkable. Other: None. ASPECTS [facility] Stroke Program Early CT Score): No localizing sign provided. IMPRESSION: 1. Nonspecific rounded hyperdensity in the left thalamus measuring 5 mm, which  could represent chronic left thalamic hemorrhage. 2. Age indeterminate infarcts in the left basal ganglia. 3. Redemonstrated chronic left MCA territory infarct and chronic right frontal lobe infarct. Electronically Signed: By: Lorenza Cambridge M.D. On: 07/08/2023 11:11   CT ANGIO HEAD NECK W WO CM W PERF (CODE STROKE)  Result Date: 07/08/2023 CLINICAL DATA:  Neuro deficit, acute, stroke suspected EXAM: CT ANGIOGRAPHY HEAD AND NECK CT PERFUSION BRAIN TECHNIQUE: Multidetector CT imaging of the head and neck was performed using the standard protocol during bolus administration of intravenous contrast. Multiplanar CT image reconstructions and MIPs were obtained to evaluate the vascular anatomy. Carotid stenosis measurements (when applicable) are obtained utilizing NASCET criteria, using the distal internal carotid diameter as the denominator. Multiphase CT imaging of the brain was performed following IV bolus contrast injection. Subsequent parametric perfusion maps were calculated using RAPID software. RADIATION DOSE REDUCTION: This exam was performed according to the departmental dose-optimization program which includes automated exposure control, adjustment of the mA and/or kV according to patient size and/or use of iterative reconstruction technique. CONTRAST:  Iodinated contrast was used to improve disease detection. COMPARISON:  CTA head 01/31/23 FINDINGS: See same day CT head for intracranial findings. CTA NECK FINDINGS Aortic arch: Standard branching. Imaged portion shows no evidence of aneurysm or dissection. No significant stenosis of the major arch vessel origins. Right carotid system: No evidence of dissection, stenosis (50% or greater) or occlusion. 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 This is new compared to 01/31/2023 CTA of the head. Vertebral arteries: Codominant. No evidence of dissection, stenosis (50% or greater) or occlusion. 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 Other neck: Negative  Upper chest: Negative Review of the MIP images confirms the above findings CTA HEAD FINDINGS Anterior circulation: Thrombus extends to the level of the ICA terminus (series 8, image 158). The left MCA territory otherwise appears normally contrast opacify. Right MCA territory is normal in appearance. Posterior circulation: Moderate narrowing in the mid P2 segment of the right PCA (series 8, image 148). No proximal occlusion, aneurysm, or vascular malformation. Venous sinuses: As permitted by contrast timing, patent. Anatomic variants: None Review of the MIP images confirms the above findings CT Brain Perfusion Findings: ASPECTS: 9 CBF (<30%) Volume: 18mL Perfusion (Tmax>6.0s) volume: 34mL Mismatch Volume: 16mL Infarction Location:Left MCA territory. Patient has a known chronic left MCA territory infarct. There is perfusion abnormality in the region of the left basal ganglia, which are likely accurate given findings on same day CTA. IMPRESSION: 1. Left ICA is occluded at the origin. This is new compared to 01/31/2023 CTA of the head. Thrombus extends to the level of the ICA terminus. The left MCA territory otherwise appears normally contrast opacify. 2. Moderate narrowing in the mid P2 segment of the right PCA. 3. Perfusion abnormality in  the region of the left basal ganglia is likely accurate given findings on same day non-contrast enhanced CT. 4. See same day CT head for intracranial findings. Findings were discussed with Dr. Viviann Spare on 07/08/23 at 11:22 AM. Electronically Signed   By: Lorenza Cambridge M.D.   On: 07/08/2023 11:36       HISTORY OF PRESENT ILLNESS 72 y.o. 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 He was found on the ground by a neighbor today and his son went to see him and saw that he was weaker on the right side with a right facial droop slurred speech.    HOSPITAL  COURSE Patient underwent cerebral angiogram with mechanical thrombectomy of left intracranial ICA occlusion at the cavernous segment with TICI 3 revascularization.  He was admitted to the ICU post thrombectomy and transferred to the floor the following day.  MRI brain with scattered acute infarcts in the left MCA and bilateral ACA territories.  Has atrial fibrillation and was restarted on his home Pradaxa.  Hypertension medications were restarted.  Therapy evaluated patient and deemed him appropriate for acute inpatient rehab.  Patient was discharged to acute inpatient rehab   Left ICA Occlusion s/p mechanical thrombectomy, TICI 3 revascularization  Etiology:  large vessel disease  Code Stroke CT head: Nonspecific rounded hyperdensity in the left thalamus, 5 mm Age-indeterminate left basal ganglia infarcts Redemonstrated chronic left MCA infarct and chronic right frontal infarct CTA head & neck w/perfusion: Occluded life ICA at origin.  Thrombus extends to the level of the ICA terminus Moderate right mid P2 narrowing Perfusion mismatch of 16 mL with perfusion abnormality in the region of the left basal ganglia   MRI:  Scattered foci of acute infarct throughout the left MCA and bilateral ACA.  MRA  No LVO or high-grade stenosis   2D Echo: EF 60 to 65%, mild LVH, elevated pulmonary artery systolic pressure, severely dilated left atria, severely dilated right atria, trivial MVR mild calcification of the aortic valve.    LDL 39 HgbA1c 7.1  Pradaxa (dabigatran) twice a day prior to admission, now on Pradaxa. (Unknown current compliance of pradaxa.)  Hx of Stroke 2010 S/p Left MCA stroke s/p thrombectomy 11/2021 Etiology thought to be d/t afib, not compliant with pradaxa 2023 CT showed multiple remote infarcts in bilateral frontal lobes Discharged on pradaxa   Atrial fibrillation Home Meds: pradaxa, metoprolol Patient previously stopped Pradaxa 07/2021 by himself, non-compliant  history. Managed by [facility], [facility] Anticoagulation with pradaxa     Hypertension CAD s/p stent x2 S/p MI  Home meds:  Toprol-XL 100mg , resumed Added amlodipine 10 mg   Hyperlipidemia Home meds:  Crestor 20mg , resumed in hospital LDL 39, goal < 70 Continue statin at discharge   Diabetes type II Uncontrolled Home meds:  jardiance 25mg , glimepride 2mg , metformin 500mg  HgbA1c 7.1, goal < 7.0 Recommend close follow-up with PCP for better DM control   Other Active Problems Essential Tremor Saw Dr. Marjory Lies at [facility] Was previously on Primidone 50mg  BID Diabetic nerve pain  Gabapentin 300mg  HS, restarted  DISCHARGE EXAM Blood pressure (!) 191/98, pulse 70, temperature 100.2 F (37.9 C), temperature source Oral, resp. rate 18, height 6\' 1"  (1.854 m), weight 94.5 kg, SpO2 99%.  PHYSICAL EXAM General:  Alert, well-nourished, well-developed patient in no acute distress Yeah so:  Mood and affect  appropriate for situation CV: Regular rate and rhythm on monitor Respiratory:  Regular, unlabored respirations on room air GI: Abdomen soft and nontender   NEURO:  Mental Status: AA&Ox2, follows commands.  Speech/Language: Slight dysarthria and Word-finding difficulties present. Decreased comprehension, memory.   Cranial Nerves:  II: PERRL. Right visual field cut.  III, IV, VI: EOMI. Eyelids elevate symmetrically.  V: Sensation is intact to light touch and symmetrical to face.  VII: Right facial droop VIII: hearing intact to voice. IX, X: Palate elevates symmetrically. Phonation is normal.  HY:QMVHQION shrug 5/5. XII: tongue is midline without fasciculations. Motor: 5/5 strength to all muscle groups tested.  Tone: is normal and bulk is normal Sensation- Decreased to right side.  Coordination: FTN, fine motor decreased on right. .No drift.  Gait- deferred  Discharge Diet      Diet   Diet heart healthy/carb modified Room service appropriate? Yes  with Assist; Fluid consistency: Thin   liquids  DISCHARGE PLAN Disposition:  Transfer to [facility] for ongoing PT, OT and ST Pradaxa (dabigatran) twice a day for secondary stroke prevention  Recommend ongoing stroke risk factor control by Primary Care Physician at time of discharge from inpatient rehabilitation. [facility]up [facility] in 2 weeks following discharge from rehab. Follow-up in [facility] in 8 weeks following discharge from rehab, office to schedule an appointment.   50 minutes were spent preparing discharge.  Gevena Mart DNP, ACNPC-AG  [facility]  I have personally obtained history,examined this patient, reviewed notes, independently viewed imaging studies, participated in medical decision making and plan of care.ROS completed by me personally and pertinent positives fully documented  I have made any additions or clarifications directly to the above note. Agree with note above.    Delia Heady, MD Medical Director [facility] Pager: [phone] 07/17/2023 9:59 AM

## 2023-07-11 NOTE — Evaluation (Addendum)
Speech Language Pathology Evaluation Patient Details Name: Cory Brown MRN: [id number] DOB: 02-25-1951 Today's Date: 07/11/2023 Time: 1027-2536 SLP Time Calculation (min) (ACUTE ONLY): 17 min  Problem List:  Patient Active Problem List   Diagnosis Date Noted   History of recent stroke 02/22/2022   Arthritis 01/16/2022   Diabetes mellitus without complication (HCC) 01/16/2022   Hypercholesterolemia 01/16/2022   Essential (primary) hypertension 01/16/2022   Kidney stones 01/16/2022   MI (myocardial infarction) (HCC) 01/16/2022   Tremor 01/16/2022   Acute bronchitis, unspecified 01/16/2022   Carpal tunnel syndrome 01/16/2022   Coronary artery disease 01/16/2022   Counseling, unspecified 01/16/2022   Low back pain 01/16/2022   Muscle weakness (generalized) 01/16/2022   Neck pain 01/16/2022   Ulnar neuropathy 01/16/2022   Cerebrovascular accident (HCC) 01/16/2022   Cerebral infarction, unspecified (HCC) 01/16/2022   Hyperlipidemia 01/16/2022   Acute ischemic stroke (HCC) 12/05/2021   Stroke (cerebrum) (HCC) 12/05/2021   Non-traumatic rhabdomyolysis 09/19/2021   Thrombocytopenia (HCC) 09/18/2021   Chronic atrial fibrillation (HCC) 09/17/2021   Generalized weakness 09/17/2021   Hyperglycemia due to diabetes mellitus (HCC) 09/17/2021   Hyponatremia 09/17/2021   Traumatic rhabdomyolysis (HCC) 09/17/2021   Special screening for malignant neoplasms, colon 11/30/2009   Stroke (HCC) 2010   Past Medical History:  Past Medical History:  Diagnosis Date   Acute bronchitis, unspecified 01/16/2022   Acute ischemic stroke (HCC) 12/05/2021   Arthritis    Atrial fibrillation (HCC)    Carpal tunnel syndrome 01/16/2022   Cerebral infarction, unspecified (HCC) 01/16/2022   Cerebrovascular accident [facility]) 01/16/2022   Dec 27, 2021 Entered By: [facility] D Comment: Left MCA stroke s/p thrombectomy, 12/05/2021.   Chronic atrial fibrillation (HCC) 09/17/2021   Coronary artery disease 01/16/2022    Dec 27, 2021 Entered By: [facility] D Comment: S/p stent Obtuse marginal 2, 10/2013.   Counseling, unspecified 01/16/2022   Diabetes mellitus without complication (HCC)    Essential (primary) hypertension 01/16/2022   Generalized weakness 09/17/2021   Hypercholesterolemia    Hyperglycemia due to diabetes mellitus (HCC) 09/17/2021   Hyperlipidemia 01/16/2022   Hypertension    Hyponatremia 09/17/2021   Kidney stones    Low back pain 01/16/2022   MI (myocardial infarction) [facility])    Muscle weakness (generalized) 01/16/2022   Neck pain 01/16/2022   Non-traumatic rhabdomyolysis 09/19/2021   Special screening for malignant neoplasms, colon 11/30/2009   May 05, 2010 Entered By: [facility] D Comment: Normal per pt   Stroke (cerebrum) (HCC) 12/05/2021   Stroke (HCC) 2010   Thrombocytopenia (HCC) 09/18/2021   Traumatic rhabdomyolysis (HCC) 09/17/2021   Tremor    Ulnar neuropathy 01/16/2022   Past Surgical History:  Past Surgical History:  Procedure Laterality Date   cardiac stents  2005   CHOLECYSTECTOMY  2007   IR CT HEAD LTD  12/05/2021   IR CT HEAD LTD  07/08/2023   IR PERCUTANEOUS ART THROMBECTOMY/INFUSION INTRACRANIAL INC DIAG ANGIO  12/05/2021   IR PERCUTANEOUS ART THROMBECTOMY/INFUSION INTRACRANIAL INC DIAG ANGIO  07/08/2023   IR US GUIDE VASC ACCESS RIGHT  07/08/2023   KIDNEY STONE SURGERY  1997-2003   RADIOLOGY WITH ANESTHESIA N/A 12/05/2021   Procedure: RADIOLOGY WITH ANESTHESIA;  Surgeon: Radiologist, Medication, MD;  Location: [facility] OR;  Service: Radiology;  Laterality: N/A;   RADIOLOGY WITH ANESTHESIA N/A 07/08/2023   Procedure: IR WITH ANESTHESIA;  Surgeon: Radiologist, Medication, MD;  Location: [facility] OR;  Service: Radiology;  Laterality: N/A;   HPI:  Patient is a 72 yo male presenting to  the ED with R sided weakness, slurred speech and facial droop on 07/08/23. 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 PMH - afib, DM, HTN, MI, CVA   Assessment / Plan / Recommendation Clinical  Impression  Pt seen for cognitive-communication evaluation. Assessment completed via informal means. Pt presents with cognitive-linguistic deficits affecting memory, orientation, problem solving/insight,  auditory comprehension (basic and complex yes/no questions; 2 step commands), and verbal expression. Speech is minimally imprecise and occasionally mildly hypophonic; however, largely intelligible. Anomia noted during structured tasks (confrontation naming, generative naming, responsive naming) and informal conversational exchanges with occasional semantic paraphasias. Both verbal and motor perseveration observed. Verbal repetition was [facility]. Pt with impaired pragmatic language with flat affect and reduced ability to [employer] conversation. Aphasia likely impacting all domains of cognition assessed. Pt would benefit from further functional/dynamic assessment of cognitive-linguistic ability including further assessment of aphasia as well as intensive post-acute SLP services for above mentioned deficitis.    SLP Assessment  SLP Recommendation/Assessment: Patient needs continued Speech Lanaguage Pathology Services SLP Visit Diagnosis: Dysarthria and anarthria (R47.1);Aphasia (R47.01);Cognitive communication deficit (R41.841)    Recommendations for follow up therapy are one component of a multi-disciplinary discharge planning process, led by the attending physician.  Recommendations may be updated based on patient status, additional functional criteria and insurance authorization.    Follow Up Recommendations  Acute inpatient rehab (3hours/day)    Assistance Recommended at Discharge  Frequent or constant Supervision/Assistance  Functional Status Assessment Patient has had a recent decline in their functional status and demonstrates the ability to make significant improvements in function in a reasonable and predictable amount of time.  Frequency and Duration min 2x/week  2 weeks      SLP  Evaluation Cognition  Overall Cognitive Status: Impaired/Different from baseline Arousal/Alertness: Awake/alert Orientation Level: Oriented to person;Disoriented to place;Disoriented to time;Disoriented to situation Attention:  (sustained attention appeared [facility] for tasks) Awareness: Impaired Awareness Impairment: Intellectual impairment;Emergent impairment Problem Solving: Impaired Problem Solving Impairment: Functional basic Safety/Judgment: Impaired       Comprehension  Auditory Comprehension Overall Auditory Comprehension: Impaired Yes/No Questions: Impaired Commands: Impaired EffectiveTechniques: Extra processing time;Visual/Gestural cues Visual Recognition/Discrimination Discrimination: Not tested Reading Comprehension Reading Status: Not tested    Expression Expression Primary Mode of Expression: Verbal Verbal Expression Overall Verbal Expression: Impaired Initiation: Impaired Automatic Speech: Name;Social Response (WFL) Level of Generative/Spontaneous Verbalization: Phrase;Sentence Repetition: No impairment Naming: Impairment Responsive: 51-75% accurate Confrontation: Impaired Divergent:  (impaired 8 animals in 60s) Verbal Errors: Perseveration;Semantic paraphasias Pragmatics: Impairment Impairments: Abnormal affect;Topic maintenance Written Expression Dominant Hand: Right Written Expression: Not tested   Oral / Motor  Oral Motor/Sensory Function Overall Oral Motor/Sensory Function: Mild impairment Facial ROM: Reduced right Facial Symmetry: Abnormal symmetry right Motor Speech Overall Motor Speech: Impaired Respiration: Within functional limits Phonation: Normal Resonance: Within functional limits Articulation: Impaired (minimally imprecise) Intelligibility: Intelligibility reduced Conversation: 75-100% accurate Interfering Components: Inadequate dentition           Clyde Canterbury, M.S., CCC-SLP Speech-Language Pathologist Secure Chat Preferred  O:  [phone]  Woodroe Chen 07/11/2023, 10:44 AM

## 2023-07-11 NOTE — TOC CM/SW Note (Signed)
Transition of Care [facility]) - Inpatient Brief Assessment   Patient Details  Name: Cory Brown MRN: [id number] Date of Birth: [birth year]  [facility] CM/SW Contact:    Kermit Balo, RN Phone Number: 07/11/2023, 10:04 AM   Clinical Narrative:  CIR has started insurance auth for a rehab admission.  Transition of Care Asessment: Insurance and Status: Insurance coverage has been reviewed Patient has primary care physician: Yes Home environment has been reviewed: home alone   Prior/Current Home Services: No current home services Social Determinants of Health Reivew: SDOH reviewed no interventions necessary Readmission risk has been reviewed: Yes Transition of care needs: transition of care needs identified, TOC will continue to follow

## 2023-07-11 NOTE — Progress Notes (Signed)
Physical Therapy Treatment Patient Details Name: Cory Brown MRN: [id number] DOB: [birth year] Today's Date: 07/11/2023   History of Present Illness Patient is a 72 yo male presenting to the ED with R sided weakness, slurred speech and facial droop on 07/08/23. 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 PMH - afib, DM, HTN, MI, CVA    PT Comments  Pt greeted resting in bed with general complaints of soreness and fatigue, agreeable to session with encouragement and demonstrating good progress towards acute goals. Pt with poor recall from OT session earlier in morning, able to recall zero activities he participated in. Pt needing grossly min A for bed mobility and transfers to stand with cues for hand placement on each rise with poor carryover. Pt able to progress gait for hallway distance with RW support and grossly min A to steady with cues for increased RLE clearance. Continued termor noted in R hand throughout session and pt continues to be limited by RLE weakness and decreased motor control of RLE/RUE. Pt able to complete seated LE exercises for increased RLE strength. Pt continues to benefit from skilled PT services to progress toward functional mobility goals.     If plan is discharge home, recommend the following: A little help with walking and/or transfers;A little help with bathing/dressing/bathroom;Assistance with cooking/housework;Direct supervision/assist for medications management;Direct supervision/assist for financial management;Assist for transportation;Help with stairs or ramp for entrance;Supervision due to cognitive status   Can travel by private vehicle        Equipment Recommendations  Rolling walker (2 wheels)    Recommendations for Other Services       Precautions / Restrictions Precautions Precautions: Fall Restrictions Weight Bearing Restrictions: No     Mobility  Bed Mobility Overal bed mobility: Needs Assistance Bed Mobility: Supine to Sit      Supine to sit: Min assist, HOB elevated, Used rails     General bed mobility comments: cues for rail use and assistance to pull up    Transfers Overall transfer level: Needs assistance Equipment used: Rolling walker (2 wheels) Transfers: Sit to/from Stand, Bed to chair/wheelchair/BSC Sit to Stand: Min assist           General transfer comment: cues for hand placement and safety with min assist to power up    Ambulation/Gait Ambulation/Gait assistance: Contact guard assist, Min assist Gait Distance (Feet): 110 Feet Assistive device: Rolling walker (2 wheels) Gait Pattern/deviations: Trunk flexed, Shuffle, Decreased weight shift to right Gait velocity: decr     General Gait Details: cues for increased RLE clearance throughout and upright posture as pt with tendency for downward gaze   Stairs             Wheelchair Mobility     Tilt Bed    Modified Rankin (Stroke Patients Only)       Balance Overall balance assessment: Needs assistance Sitting-balance support: Feet supported, Single extremity supported Sitting balance-Leahy Scale: Good Sitting balance - Comments: EOB   Standing balance support: Single extremity supported, Bilateral upper extremity supported, During functional activity Standing balance-Leahy Scale: Poor Standing balance comment: reliant of RW support                            Cognition Arousal: Alert Behavior During Therapy: Flat affect Overall Cognitive Status: Impaired/Different from baseline Area of Impairment: Orientation, Attention, Memory, Following commands, Safety/judgement, Awareness, Problem solving  Orientation Level: Disoriented to, Time, Situation, Place Current Attention Level: Sustained Memory: Decreased short-term memory Following Commands: Follows one step commands consistently Safety/Judgement: Decreased awareness of deficits Awareness: Emergent Problem Solving: Slow processing,  Difficulty sequencing, Requires verbal cues General Comments: stating 1977 and no guess as to location, stating "im here to get help" once re-oriented to hospital        Exercises General Exercises - Lower Extremity Long Arc Quad: AROM, Right, 10 reps, Seated Hip Flexion/Marching: AROM, Both, 20 reps, Seated    General Comments General comments (skin integrity, edema, etc.): VSS on RA      Pertinent Vitals/Pain Pain Assessment Pain Assessment: Faces Faces Pain Scale: Hurts a little bit Pain Location: "everywhere" Pain Descriptors / Indicators: Grimacing, Guarding, Discomfort Pain Intervention(s): Monitored during session, Limited activity within patient's tolerance, Repositioned    Home Living     Available Help at Discharge: Family;Available 24 hours/day (son and nephew)                    Prior Function            PT Goals (current goals can now be found in the care plan section) Acute Rehab PT Goals PT Goal Formulation: With patient Time For Goal Achievement: 07/23/23 Progress towards PT goals: Progressing toward goals    Frequency    Min 1X/week      PT Plan      Co-evaluation              AM-PAC PT "6 Clicks" Mobility   Outcome Measure  Help needed turning from your back to your side while in a flat bed without using bedrails?: A Little Help needed moving from lying on your back to sitting on the side of a flat bed without using bedrails?: A Little Help needed moving to and from a bed to a chair (including a wheelchair)?: A Little Help needed standing up from a chair using your arms (e.g., wheelchair or bedside chair)?: A Little Help needed to walk in hospital room?: A Little Help needed climbing 3-5 steps with a railing? : Total 6 Click Score: 16    End of Session Equipment Utilized During Treatment: Gait belt Activity Tolerance: Patient tolerated treatment well Patient left: with call bell/phone within reach;with chair alarm set;in  chair Nurse Communication: Mobility status PT Visit Diagnosis: Unsteadiness on feet (R26.81);Other abnormalities of gait and mobility (R26.89);Pain Pain - Right/Left: Right Pain - part of body: Knee     Time: 4098-1191 PT Time Calculation (min) (ACUTE ONLY): 14 min  Charges:    $Gait Training: 8-22 mins PT General Charges $$ ACUTE PT VISIT: 1 Visit                     Pelham Hennick R. PTA Acute Rehabilitation Services Office: [phone]   Catalina Antigua 07/11/2023, 12:12 PM

## 2023-07-11 NOTE — TOC Transition Note (Signed)
Transition of Care [facility]) - CM/SW Discharge Note   Patient Details  Name: Cory Brown MRN: [id number] Date of Birth: [birth year]  Transition of Care [facility]) CM/SW Contact:  Kermit Balo, RN Phone Number: 07/11/2023, 2:09 PM   Clinical Narrative:    Pt is discharging to CIR today. TOC signing off.   Final next level of care: IP Rehab Facility Barriers to Discharge: No Barriers Identified   Patient Goals and CMS Choice      Discharge Placement                         Discharge Plan and Services Additional resources added to the After Visit Summary for                                       Social Determinants of Health (SDOH) Interventions SDOH Screenings   Food Insecurity: No Food Insecurity (07/10/2023)  Housing: Low Risk  (07/10/2023)  Transportation Needs: No Transportation Needs (07/10/2023)  Utilities: Not At Risk (07/10/2023)  Financial Resource Strain: Low Risk  (01/01/2023)   [facility]  Social Connections: Unknown (03/02/2022)   [facility] from [facility], [facility]  Stress: Stress Concern Present (11/08/2022)   Received from [facility], [facility]  Tobacco Use: Low Risk  (07/08/2023)     Readmission Risk Interventions     No data to display

## 2023-07-11 NOTE — Progress Notes (Signed)
Pt rolled off unit. All belongings carried up with son .

## 2023-07-11 NOTE — Progress Notes (Addendum)
STROKE TEAM PROGRESS NOTE   BRIEF HPI Cory Brown is a 72 y.o. 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 He was found on the ground by a neighbor today and his son went to see him and saw that he was weaker on the right side with a right facial droop slurred speech and a "1000 yard stare". At baseline it is reported that he has recovered well from his stroke and is currently ambulating independently but in physical therapy for minor right leg weakness. On exam, had r-sided weakness, slurred speech, r facial droop. LKW 2130 9/7. NIH 12.  Previous M1 occlusion with thrombectomy in 2023, etiology thought to be d/t Afib, noncompliance with Pradaxa. Unknown current compliance.    SIGNIFICANT HOSPITAL EVENTS 9/8: CTA shows Left ICA occlusion  Taken for mech thrombectomy, TICI 3 achieved.  9/9: Passed swallow. PT/OT recommending CIR.   INTERIM HISTORY/SUBJECTIVE No family at the bedside. Patient is laying in bed in no apparent distress. Is awake alert and oriented to self and place.  He is unable to state the correct month or current president.  Neurological exam is stable and unchanged Vital signs are slightly elevated will add amlodipine 10 mg  OBJECTIVE  CBC    Component Value Date/Time   WBC 6.6 07/10/2023 0522   RBC 4.96 07/10/2023 0522   HGB 16.1 07/10/2023 0522   HCT 47.1 07/10/2023 0522   PLT 154 07/10/2023 0522   MCV 95.0 07/10/2023 0522   MCH 32.5 07/10/2023 0522   MCHC 34.2 07/10/2023 0522   RDW 13.2 07/10/2023 0522   LYMPHSABS 0.9 07/08/2023 1052   MONOABS 0.9 07/08/2023 1052   EOSABS 0.0 07/08/2023 1052   BASOSABS 0.1 07/08/2023 1052    BMET    Component Value Date/Time   NA 139 07/10/2023 0522   K 3.7 07/10/2023 0522   CL 104 07/10/2023 0522   CO2 22 07/10/2023 0522   GLUCOSE 98 07/10/2023 0522   BUN 31 (H) 07/10/2023 0522    CREATININE 1.17 07/10/2023 0522   CALCIUM 8.8 (L) 07/10/2023 0522   GFRNONAA >60 07/10/2023 0522    IMAGING past 24 hours No results found.  Vitals:   07/11/23 0009 07/11/23 0532 07/11/23 0750 07/11/23 1207  BP: (!) 172/75 (!) 181/93 (!) 187/89 (!) 194/98  Pulse: 70 71 71 71  Resp: 14 16 17 19   Temp: 98.8 F (37.1 C) 98.6 F (37 C) 98.6 F (37 C) 99.3 F (37.4 C)  TempSrc: Oral Oral Oral Oral  SpO2: 100% 100% 98% 100%  Weight:      Height:       PHYSICAL EXAM General:  Alert, well-nourished, well-developed patient in no acute distress Psych:  Mood and affect appropriate for situation CV: Regular rate and rhythm on monitor Respiratory:  Regular, unlabored respirations on room air GI: Abdomen soft and nontender  NEURO:  Mental Status: AA&Ox2, follows commands.  Speech/Language: Slight dysarthria and Word-finding difficulties present. Decreased comprehension, memory.  Cranial Nerves:  II: PERRL. Right visual field cut.  III, IV, VI: EOMI. Eyelids elevate symmetrically.  V: Sensation is intact to light touch and symmetrical to face.  VII: Right facial droop VIII: hearing intact to voice. IX, X: Palate elevates symmetrically. Phonation is normal.  RU:[id number] shrug 5/5. XII: tongue is midline without fasciculations. Motor: 5/5 strength to all muscle groups tested.  Tone: is normal and bulk is normal Sensation- Decreased to right side.  Coordination: FTN, fine motor decreased on right. .No drift.  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  Thrombus extends to the level of the ICA terminus Moderate right mid P2 narrowing Perfusion mismatch of 16 mL with perfusion abnormality in the region of the left basal ganglia Post IR CT: No hemorrhage  MRI:  Scattered foci of acute infarct throughout the left MCA and bilateral ACA. Acute hemorrhage or significant mass effect MRA  No LVO or high-grade stenosis  2D Echo: EF 60 to 65%, mild LVH, elevated pulmonary artery systolic pressure, severely dilated left atria, severely dilated right atria, trivial MVR mild calcification of the aortic valve.   LDL 39 HgbA1c 7.1 VTE prophylaxis - SCds Pradaxa (dabigatran) twice a day prior to admission, now on Pradaxa. (Unknown current compliance of pradaxa.) Therapy recommendations:  CIR SLP cognitive eval ordered Disposition:  pending  Hx of Stroke 2010 S/p Left MCA stroke s/p thrombectomy 11/2021 Etiology thought to be d/t afib, not compliant with pradaxa 2023 CT showed multiple remote infarcts in bilateral frontal lobes Discharged on pradaxa  Atrial fibrillation Home Meds: pradaxa, metoprolol Patient previously stopped Pradaxa 07/2021 by himself, non-compliant history. Managed by [facility], previously [facility] Continue telemetry monitoring Anticoagulation with pradaxa    Hypertension CAD s/p stent x2 S/p MI  Home meds:  Toprol-XL 100mg , resumed Stable Blood Pressure Goal: SBP 120-160 for first 24 hours then less than 180  Will add amlodipine 10 mg  Hyperlipidemia Home meds:  Crestor 20mg , resumed in hospital LDL 39, goal < 70 Continue statin at discharge  Diabetes type II Uncontrolled Home meds:  jardiance 25mg , glimepride 2mg , metformin 500mg  HgbA1c 7.1, goal < 7.0 CBGs SSI Recommend close follow-up with PCP for better DM control  Other Stroke Risk Factors Family hx stroke (mother)  Other Active Problems Essential  Tremor Saw Dr. Marjory Lies at [facility] Was previously on Primidone 50mg  BID Diabetic nerve pain  Gabapentin 300mg  HS, restarted  Hospital day # 3   Pt seen by Neuro NP/APP and later by MD. Note/plan to be edited by MD as needed.    Cory Mart DNP, ACNPC-AG  [facility]  I have personally obtained history,examined this patient, reviewed notes, independently viewed imaging studies, participated in medical decision making and plan of care.ROS completed by me personally and pertinent positives fully documented  I have made any additions or clarifications directly to the above note. Agree with note above.  Patient neurological exam is stable.  He is medically stable to be transferred to inpatient rehab when bed available.  Cory Heady, MD Medical Director [facility] Stroke Center Pager: [phone] 07/11/2023 4:38 PM   To contact Stroke Continuity provider, please refer to [email or web address]. After hours, contact General Neurology

## 2023-07-11 NOTE — Progress Notes (Signed)
Occupational Therapy Treatment Patient Details Name: Cory Brown MRN: [id number] DOB: April 24, 1951 Today's Date: 07/11/2023   History of present illness Patient is a 72 yo male presenting to the ED with R sided weakness, slurred speech and facial droop on 07/08/23. 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 PMH - afib, DM, HTN, MI, CVA   OT comments  Patient demonstrating good gains with OT treatment with bed mobility, mobility, transfers, and self care. Patient was able to stand at sink for UB bathing and grooming tasks and LB seated. Patient will benefit from intensive inpatient follow up therapy, >3 hours/day to continue to address bathing, dressing, and functional transfers. Acute OT to continue to follow.       If plan is discharge home, recommend the following:  Assistance with cooking/housework;Direct supervision/assist for medications management;Direct supervision/assist for financial management;Assist for transportation;Help with stairs or ramp for entrance;Supervision due to cognitive status;A little help with walking and/or transfers;A lot of help with bathing/dressing/bathroom   Equipment Recommendations  Other (comment) (defer to next venue of care)    Recommendations for Other Services      Precautions / Restrictions Precautions Precautions: Fall Restrictions Weight Bearing Restrictions: No       Mobility Bed Mobility Overal bed mobility: Needs Assistance Bed Mobility: Supine to Sit     Supine to sit: Min assist, HOB elevated, Used rails     General bed mobility comments: cues for rail use and assistance to pull up    Transfers Overall transfer level: Needs assistance Equipment used: Rolling walker (2 wheels) Transfers: Sit to/from Stand, Bed to chair/wheelchair/BSC Sit to Stand: Min assist           General transfer comment: cues for hand placement and safety with min assist to power up     Balance Overall balance assessment: Needs  assistance Sitting-balance support: Feet supported, Single extremity supported Sitting balance-Leahy Scale: Good Sitting balance - Comments: EOB   Standing balance support: Single extremity supported, Bilateral upper extremity supported, During functional activity Standing balance-Leahy Scale: Poor Standing balance comment: one extremity support while standing at sink                           ADL either performed or assessed with clinical judgement   ADL Overall ADL's : Needs assistance/impaired     Grooming: Wash/dry hands;Wash/dry face;Oral care;Brushing hair;Contact guard assist;Standing;Cueing for sequencing Grooming Details (indicate cue type and reason): required cues for sequencing Upper Body Bathing: Set up;Cueing for sequencing;Standing   Lower Body Bathing: Moderate assistance;Sit to/from stand;Sitting/lateral leans   Upper Body Dressing : Supervision/safety;Sitting   Lower Body Dressing: Moderate assistance;Maximal assistance;Sitting/lateral leans;Sit to/from stand Lower Body Dressing Details (indicate cue type and reason): able to doff socks, unable to [employer] Transfer: Minimal assistance;Rolling walker (2 wheels) Toilet Transfer Details (indicate cue type and reason): simulated           General ADL Comments: making good gains with self care tasks    Extremity/Trunk Assessment              Vision       Perception     Praxis      Cognition Arousal: Alert Behavior During Therapy: Flat affect Overall Cognitive Status: Impaired/Different from baseline Area of Impairment: Orientation, Attention, Memory, Following commands, Safety/judgement, Awareness, Problem solving                 Orientation Level: Disoriented to, Time,  Situation, Place Current Attention Level: Sustained Memory: Decreased short-term memory Following Commands: Follows one step commands consistently Safety/Judgement: Decreased awareness of deficits Awareness:  Emergent Problem Solving: Slow processing, Difficulty sequencing, Requires verbal cues General Comments: believed he was in a nursing home in [facility] [place]        Exercises      Shoulder Instructions       General Comments VSS on RA    Pertinent Vitals/ Pain       Pain Assessment Pain Assessment: Faces Faces Pain Scale: Hurts a little bit Pain Location: right shoulder, back Pain Descriptors / Indicators: Grimacing, Guarding, Discomfort Pain Intervention(s): Limited activity within patient's tolerance, Monitored during session, Repositioned  Home Living     Available Help at Discharge: Family;Available 24 hours/day (son and nephew)                                Lives With: Alone    Prior Functioning/Environment              Frequency  Min 1X/week        Progress Toward Goals  OT Goals(current goals can now be found in the care plan section)  Progress towards OT goals: Progressing toward goals  Acute Rehab OT Goals Patient Stated Goal: none stated OT Goal Formulation: With patient Time For Goal Achievement: 07/23/23 Potential to Achieve Goals: Good ADL Goals Pt Will Perform Lower Body Bathing: with supervision;sit to/from stand;sitting/lateral leans Pt Will Perform Lower Body Dressing: with supervision;sit to/from stand;sitting/lateral leans Pt Will Transfer to Toilet: with supervision;ambulating;regular height toilet Pt Will Perform Toileting - Clothing Manipulation and hygiene: with supervision;sitting/lateral leans;sit to/from stand Additional ADL Goal #1: Patient will be able to follow 2 step commands as a precursor to upper level cognitive tasks to return to prior level of independence.  Plan      Co-evaluation                 AM-PAC OT "6 Clicks" Daily Activity     Outcome Measure   Help from another person eating meals?: A Little Help from another person taking care of personal grooming?: A Little Help from another person  toileting, which includes using toliet, bedpan, or urinal?: A Lot Help from another person bathing (including washing, rinsing, drying)?: A Lot Help from another person to put on and taking off regular upper body clothing?: A Little Help from another person to put on and taking off regular lower body clothing?: A Lot 6 Click Score: 15    End of Session Equipment Utilized During Treatment: Gait belt;Rolling walker (2 wheels)  OT Visit Diagnosis: Unsteadiness on feet (R26.81);Other abnormalities of gait and mobility (R26.89);Muscle weakness (generalized) (M62.81);Other symptoms and signs involving cognitive function;Other symptoms and signs involving the nervous system (R29.898);Pain Pain - Right/Left: Right Pain - part of body: Shoulder   Activity Tolerance Patient tolerated treatment well   Patient Left in chair;with call bell/phone within reach;with chair alarm set   Nurse Communication Mobility status        Time: 2841-3244 OT Time Calculation (min): 27 min  Charges: OT General Charges $OT Visit: 1 Visit OT Treatments $Self Care/Home Management : 23-37 mins  Alfonse Flavors, OTA Acute Rehabilitation Services  Office [phone]   Dewain Penning 07/11/2023, 11:31 AM

## 2023-07-11 NOTE — Progress Notes (Signed)
Inpatient Rehabilitation Admissions Coordinator   I have insurance approval and CIR bed to admit him to today. Dr Pearlean Brownie contacted and will d/c . I will make the arrangements to admit today.  Ottie Glazier, RN, MSN Rehab Admissions Coordinator [phone] 07/11/2023 2:07 PM

## 2023-07-11 NOTE — Progress Notes (Signed)
Inpatient Rehabilitation Admission Medication Review by a Pharmacist  A complete drug regimen review was completed for this patient to identify any potential clinically significant medication issues.  High Risk Drug Classes Is patient taking? Indication by Medication  Antipsychotic Yes Compazine - N/V  Anticoagulant Yes Pradaxa - AF  Antibiotic No   Opioid No   Antiplatelet No   Hypoglycemics/insulin Yes SSI - DM  Vasoactive Medication Yes Amlodipine/metoprolol - HTN  Chemotherapy No   Other Yes Gabapentin - neuropathy Voltaren gel - pain Liquifilm tears - dry eyes Rosuvastatin - HLD Trazodone - sleep Aspercream - pain     Type of Medication Issue Identified Description of Issue Recommendation(s)  Drug Interaction(s) (clinically significant)     Duplicate Therapy     Allergy     No Medication Administration End Date     Incorrect Dose     Additional Drug Therapy Needed     Significant med changes from prior encounter (inform family/care partners about these prior to discharge).    Other   jardiance 25mg , glimepride 2mg , metformin 500mg   Resume at discharge if needed    Clinically significant medication issues were identified that warrant physician communication and completion of prescribed/recommended actions by midnight of the next day:  No  Name of provider notified for urgent issues identified:   Provider Method of Notification:     Pharmacist comments:   Time spent performing this drug regimen review (minutes):  20   Ulyses Southward, PharmD, [place], AAHIVP, CPP Infectious Disease Pharmacist 07/11/2023 6:18 PM

## 2023-07-11 NOTE — Progress Notes (Signed)
Chaplain responded to [facility] consult for Advance Directive education. As chaplain arrived, provider was leaving the room and expressed concern in pt's cognitive ability to complete the documents. Chaplain assured her that we would not proceed if pt was not able to appropriately understand/participate in the conversation. Chaplain introduced spiritual care and informed pt about [facility] consult for ADs. Pt was not familiar and stated he did not make this request. Pt did not consent to further education, but did consent to chaplain leaving materials at bedside. Mr Pfeuffer stated he was doing okay today and thanked chaplain for the visit.  Please page as further needs arise.  Maryanna Shape. Carley Hammed, M.Div. [facility] Chaplain Pager [phone] Office [phone]

## 2023-07-11 NOTE — Plan of Care (Signed)
Problem: Ischemic Stroke/TIA Tissue Perfusion: Goal: Complications of ischemic stroke/TIA will be minimized Outcome: Progressing   Problem: Coping: Goal: Will verbalize positive feelings about self Outcome: Progressing Goal: Will identify appropriate support needs Outcome: Progressing   Problem: Health Behavior/Discharge Planning: Goal: Ability to manage health-related needs will improve Outcome: Progressing   Problem: Self-Care: Goal: Ability to participate in self-care as condition permits will improve Outcome: Progressing Goal: Verbalization of feelings and concerns over difficulty with self-care will improve Outcome: Progressing Goal: Ability to communicate needs accurately will improve Outcome: Progressing   Problem: Nutrition: Goal: Risk of aspiration will decrease Outcome: Progressing Goal: Dietary intake will improve Outcome: Progressing   Problem: Education: Goal: Knowledge of General Education information will improve Description: Including pain rating scale, medication(s)/side effects and non-pharmacologic comfort measures Outcome: Progressing   Problem: Health Behavior/Discharge Planning: Goal: Ability to manage health-related needs will improve Outcome: Progressing   Problem: Clinical Measurements: Goal: Ability to maintain clinical measurements within normal limits will improve Outcome: Progressing Goal: Will remain free from infection Outcome: Progressing Goal: Diagnostic test results will improve Outcome: Progressing Goal: Respiratory complications will improve Outcome: Progressing Goal: Cardiovascular complication will be avoided Outcome: Progressing   Problem: Activity: Goal: Risk for activity intolerance will decrease Outcome: Progressing   Problem: Nutrition: Goal: Adequate nutrition will be maintained Outcome: Progressing   Problem: Coping: Goal: Level of anxiety will decrease Outcome: Progressing   Problem: Elimination: Goal: Will  not experience complications related to bowel motility Outcome: Progressing Goal: Will not experience complications related to urinary retention Outcome: Progressing   Problem: Pain Managment: Goal: General experience of comfort will improve Outcome: Progressing   Problem: Safety: Goal: Ability to remain free from injury will improve Outcome: Progressing   Problem: Skin Integrity: Goal: Risk for impaired skin integrity will decrease Outcome: Progressing   Problem: Ischemic Stroke/TIA Tissue Perfusion: Goal: Complications of ischemic stroke/TIA will be minimized Outcome: Progressing   Problem: Coping: Goal: Will verbalize positive feelings about self Outcome: Progressing Goal: Will identify appropriate support needs Outcome: Progressing   Problem: Health Behavior/Discharge Planning: Goal: Ability to manage health-related needs will improve Outcome: Progressing   Problem: Self-Care: Goal: Ability to participate in self-care as condition permits will improve Outcome: Progressing Goal: Verbalization of feelings and concerns over difficulty with self-care will improve Outcome: Progressing Goal: Ability to communicate needs accurately will improve Outcome: Progressing   Problem: Nutrition: Goal: Risk of aspiration will decrease Outcome: Progressing Goal: Dietary intake will improve Outcome: Progressing   Problem: Education: Goal: Knowledge of General Education information will improve Description: Including pain rating scale, medication(s)/side effects and non-pharmacologic comfort measures Outcome: Progressing   Problem: Health Behavior/Discharge Planning: Goal: Ability to manage health-related needs will improve Outcome: Progressing   Problem: Clinical Measurements: Goal: Ability to maintain clinical measurements within normal limits will improve Outcome: Progressing Goal: Will remain free from infection Outcome: Progressing Goal: Diagnostic test results will  improve Outcome: Progressing Goal: Respiratory complications will improve Outcome: Progressing Goal: Cardiovascular complication will be avoided Outcome: Progressing   Problem: Activity: Goal: Risk for activity intolerance will decrease Outcome: Progressing   Problem: Nutrition: Goal: Adequate nutrition will be maintained Outcome: Progressing   Problem: Coping: Goal: Level of anxiety will decrease Outcome: Progressing   Problem: Elimination: Goal: Will not experience complications related to bowel motility Outcome: Progressing Goal: Will not experience complications related to urinary retention Outcome: Progressing   Problem: Pain Managment: Goal: General experience of comfort will improve Outcome: Progressing   Problem: Safety: Goal: Ability to remain  free from injury will improve Outcome: Progressing   Problem: Skin Integrity: Goal: Risk for impaired skin integrity will decrease Outcome: Progressing   Problem: Ischemic Stroke/TIA Tissue Perfusion: Goal: Complications of ischemic stroke/TIA will be minimized Outcome: Progressing   Problem: Coping: Goal: Will verbalize positive feelings about self Outcome: Progressing Goal: Will identify appropriate support needs Outcome: Progressing   Problem: Health Behavior/Discharge Planning: Goal: Ability to manage health-related needs will improve Outcome: Progressing   Problem: Self-Care: Goal: Ability to participate in self-care as condition permits will improve Outcome: Progressing Goal: Verbalization of feelings and concerns over difficulty with self-care will improve Outcome: Progressing Goal: Ability to communicate needs accurately will improve Outcome: Progressing   Problem: Nutrition: Goal: Risk of aspiration will decrease Outcome: Progressing Goal: Dietary intake will improve Outcome: Progressing   Problem: Education: Goal: Knowledge of General Education information will improve Description: Including  pain rating scale, medication(s)/side effects and non-pharmacologic comfort measures Outcome: Progressing   Problem: Health Behavior/Discharge Planning: Goal: Ability to manage health-related needs will improve Outcome: Progressing   Problem: Clinical Measurements: Goal: Ability to maintain clinical measurements within normal limits will improve Outcome: Progressing Goal: Will remain free from infection Outcome: Progressing Goal: Diagnostic test results will improve Outcome: Progressing Goal: Respiratory complications will improve Outcome: Progressing Goal: Cardiovascular complication will be avoided Outcome: Progressing   Problem: Activity: Goal: Risk for activity intolerance will decrease Outcome: Progressing   Problem: Nutrition: Goal: Adequate nutrition will be maintained Outcome: Progressing   Problem: Coping: Goal: Level of anxiety will decrease Outcome: Progressing   Problem: Elimination: Goal: Will not experience complications related to bowel motility Outcome: Progressing Goal: Will not experience complications related to urinary retention Outcome: Progressing   Problem: Pain Managment: Goal: General experience of comfort will improve Outcome: Progressing   Problem: Safety: Goal: Ability to remain free from injury will improve Outcome: Progressing   Problem: Skin Integrity: Goal: Risk for impaired skin integrity will decrease Outcome: Progressing

## 2023-07-12 DIAGNOSIS — I63232 Cerebral infarction due to unspecified occlusion or stenosis of left carotid arteries: Secondary | ICD-10-CM | POA: Diagnosis not present

## 2023-07-12 DIAGNOSIS — I69398 Other sequelae of cerebral infarction: Secondary | ICD-10-CM | POA: Diagnosis not present

## 2023-07-12 LAB — CBC WITH DIFFERENTIAL/PLATELET
Abs Immature Granulocytes: 0.03 10*3/uL (ref 0.00–0.07)
Basophils Absolute: 0.1 10*3/uL (ref 0.0–0.1)
Basophils Relative: 1 %
Eosinophils Absolute: 0 10*3/uL (ref 0.0–0.5)
Eosinophils Relative: 0 %
HCT: 50.7 % (ref 39.0–52.0)
Hemoglobin: 17.6 g/dL — ABNORMAL HIGH (ref 13.0–17.0)
Immature Granulocytes: 0 %
Lymphocytes Relative: 13 %
Lymphs Abs: 1.1 10*3/uL (ref 0.7–4.0)
MCH: 33.2 pg (ref 26.0–34.0)
MCHC: 34.7 g/dL (ref 30.0–36.0)
MCV: 95.7 fL (ref 80.0–100.0)
Monocytes Absolute: 1.1 10*3/uL — ABNORMAL HIGH (ref 0.1–1.0)
Monocytes Relative: 14 %
Neutro Abs: 5.7 10*3/uL (ref 1.7–7.7)
Neutrophils Relative %: 72 %
Platelets: 141 10*3/uL — ABNORMAL LOW (ref 150–400)
RBC: 5.3 MIL/uL (ref 4.22–5.81)
RDW: 12.9 % (ref 11.5–15.5)
WBC: 8 10*3/uL (ref 4.0–10.5)
nRBC: 0 % (ref 0.0–0.2)

## 2023-07-12 LAB — COMPREHENSIVE METABOLIC PANEL
ALT: 24 U/L (ref 0–44)
AST: 24 U/L (ref 15–41)
Albumin: 3.6 g/dL (ref 3.5–5.0)
Alkaline Phosphatase: 95 U/L (ref 38–126)
Anion gap: 16 — ABNORMAL HIGH (ref 5–15)
BUN: 24 mg/dL — ABNORMAL HIGH (ref 8–23)
CO2: 19 mmol/L — ABNORMAL LOW (ref 22–32)
Calcium: 9.1 mg/dL (ref 8.9–10.3)
Chloride: 103 mmol/L (ref 98–111)
Creatinine, Ser: 1.05 mg/dL (ref 0.61–1.24)
GFR, Estimated: >60 mL/min (ref >60)
Glucose, Bld: 240 mg/dL — ABNORMAL HIGH (ref 70–99)
Potassium: 3.9 mmol/L (ref 3.5–5.1)
Sodium: 138 mmol/L (ref 135–145)
Total Bilirubin: 1.8 mg/dL — ABNORMAL HIGH (ref 0.3–1.2)
Total Protein: 6.9 g/dL (ref 6.5–8.1)

## 2023-07-12 LAB — GLUCOSE, CAPILLARY
Glucose-Capillary: 154 mg/dL — ABNORMAL HIGH (ref 70–99)
Glucose-Capillary: 199 mg/dL — ABNORMAL HIGH (ref 70–99)
Glucose-Capillary: 134 mg/dL — ABNORMAL HIGH (ref 70–99)
Glucose-Capillary: 171 mg/dL — ABNORMAL HIGH (ref 70–99)

## 2023-07-12 LAB — VITAMIN D 25 HYDROXY (VIT D DEFICIENCY, FRACTURES): Vit D, 25-Hydroxy: 32.5 ng/mL (ref 30–100)

## 2023-07-12 LAB — MAGNESIUM: Magnesium: 2 mg/dL (ref 1.7–2.4)

## 2023-07-12 MED ORDER — VITAMIN D (ERGOCALCIFEROL) 1.25 MG (50000 UNIT) PO CAPS
50000.0000 [IU] | ORAL_CAPSULE | ORAL | Status: DC
  Administered 2023-07-12 – 2023-07-19 (×2): 50000 [IU] via ORAL
  Filled 2023-07-12 (×2): qty 1

## 2023-07-12 MED ORDER — METFORMIN HCL 500 MG PO TABS
500.0000 mg | ORAL_TABLET | Freq: Every day | ORAL | Status: DC
  Administered 2023-07-13 – 2023-07-14 (×2): 500 mg via ORAL
  Filled 2023-07-12 (×2): qty 1

## 2023-07-12 NOTE — Progress Notes (Signed)
Horton Chin, MD  Physician Physical Medicine and Rehabilitation   PMR Pre-admission    Signed   Date of Service: 07/10/2023  3:09 PM  Related encounter: ED to Hosp-Admission (Discharged) from 07/08/2023 in [facility]   Signed     Expand All Collapse All  Show:Clear all [x] Written[x] Templated[] Copied  Added by: [x] Standley Brooking, RN[x] Raulkar, Drema Pry, MD  [] Hover for details PMR Admission Coordinator Pre-Admission Assessment   Patient: Cory Brown is an 72 y.o., male MRN: [id number] DOB: December 08, 1950 Height: 6\' 1"  (185.4 cm) Weight: 94.5 kg   Insurance Information HMO:     PPO:      PCP:      IPA:      80/20:      OTHER:  PRIMARY: [facility]      Policy#: [id number]      Subscriber: pt CM Name: Cassie      Phone#: [email or web address]     Fax#: [email or web address] or fax [phone] Pre-Cert#: [id number] approved 9/11 until 10/11    Employer:  Benefits:  Phone #: [phone]     Name: 9/10 Eff. Date: 03/26/2018     Deduct: none      Out of Pocket Max: none      Life Max: none CIR: per [facility]      SNF: per [place] Outpatient: per [facility]     Co-Pay:  Home Health: per [place]      Co-Pay:  DME: per [facility]     Co-Pay: per [place] Providers: in network  SECONDARY: [employer] Medicare      Policy#: [id number]       Financial Counselor:       Phone#:    The "Data Collection Information Summary" for patients in Inpatient Rehabilitation Facilities with attached "Privacy Act Statement-Health Care Records" was provided and verbally reviewed with: Patient and Family   Emergency Contact Information Contact Information       Name Relation Home Work Mobile    [name] [phone]             Other Contacts   None on File      Current Medical History  Patient Admitting Diagnosis: CVA   History of Present Illness: 72 year old male with history of left MCA stroke s/p mechanical thrombectomy, HTN, 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 Found on the ground by a neighbor and noted that he was weaker on his right side and slurred speech.    CTA showed left ICA occlusion. Taken for mechanical thrombectomy, TICI 3 achieved. MRI scattered foci of acute infarct throughout the left MCA and bilateral ACA. MRA with no LVO or high grade stenosis. 2 d echo EF 60 to 65%, Mild LVH, elevated pulmonary artery systolic pressure, severely dilated left atria, severely dilated right atria, trivial MVR mild calcification of the aortic valve.    Home meds Pradaxa and metoprolol for afib. Resume Pradaxa. LDL 39 on Crestor to continue. Resumed Toprol XL for HTN and CAD. Home meds Jardiance, glimepride and metformin with Hgb A1c 7.1. CBGS and SSI, close f/u with PCP as OP. On Primidone for essential tremor and gabapentin for DM nerve pain.    Complete NIHSS TOTAL: 0   Patient's medical record from [facility] has been reviewed by the rehabilitation admission coordinator and physician.   Past Medical History      Past Medical History:  Diagnosis Date   Acute bronchitis, unspecified 01/16/2022   Acute ischemic stroke (HCC) 12/05/2021   Arthritis     Atrial fibrillation (HCC)     Carpal tunnel syndrome 01/16/2022   Cerebral infarction, unspecified (HCC) 01/16/2022   Cerebrovascular accident [facility]) 01/16/2022    Dec 27, 2021 Entered By: [facility] D Comment: Left MCA stroke s/p thrombectomy, 12/05/2021.   Chronic atrial fibrillation (HCC) 09/17/2021   Coronary artery disease 01/16/2022    Dec 27, 2021 Entered By: [facility] D Comment: S/p stent Obtuse marginal 2, 10/2013.   Counseling, unspecified 01/16/2022   Diabetes mellitus without complication (HCC)     Essential (primary) hypertension 01/16/2022   Generalized weakness 09/17/2021   Hypercholesterolemia     Hyperglycemia due to diabetes mellitus (HCC) 09/17/2021   Hyperlipidemia 01/16/2022   Hypertension      Hyponatremia 09/17/2021   Kidney stones     Low back pain 01/16/2022   MI (myocardial infarction) [facility])     Muscle weakness (generalized) 01/16/2022   Neck pain 01/16/2022   Non-traumatic rhabdomyolysis 09/19/2021   Special screening for malignant neoplasms, colon 11/30/2009    May 05, 2010 Entered By: [facility] D Comment: Normal per pt   Stroke (cerebrum) (HCC) 12/05/2021   Stroke (HCC) 2010   Thrombocytopenia (HCC) 09/18/2021   Traumatic rhabdomyolysis (HCC) 09/17/2021   Tremor     Ulnar neuropathy 01/16/2022        Has the patient had major surgery during 100 days prior to admission? Yes   Family History   family history includes Cancer in his brother and sister; Diabetes in his brother and father; Hypertension in his mother; Stroke in his mother.   Current Medications  Current Medications    Current Facility-Administered Medications:    acetaminophen (TYLENOL) tablet 650 mg, 650 mg, Oral, Q4H PRN, 650 mg at 07/10/23 0833 **OR** acetaminophen (TYLENOL) 160 MG/5ML solution 650 mg, 650 mg, Per Tube, Q4H PRN **OR** acetaminophen (TYLENOL) suppository 650 mg, 650 mg, Rectal, Q4H PRN, Pamalee Leyden, Devon, NP   amLODipine (NORVASC) tablet 10 mg, 10 mg, Oral, Daily, Gevena Mart A, NP   dabigatran (PRADAXA) capsule 150 mg, 150 mg, Oral, Q12H, Palikh, Gaurang M, MD, 150 mg at 07/11/23 1125   gabapentin (NEURONTIN) capsule 100 mg, 100 mg, Oral, Daily, Richardo Priest, Erin C, NP, 100 mg at 07/11/23 1124   metoprolol succinate (TOPROL-XL) 24 hr tablet 100 mg, 100 mg, Oral, Daily, Francena Hanly, RPH, 100 mg at 07/11/23 1124   polyvinyl alcohol (LIQUIFILM TEARS) 1.4 % ophthalmic solution 1 drop, 1 drop, Both Eyes, PRN, Pearlean Brownie, Pramod S, MD   rosuvastatin (CRESTOR) tablet 10 mg, 10 mg, Oral, Daily, Richardo Priest, Erin C, NP, 10 mg at 07/11/23 1124   senna-docusate (Senokot-S) tablet 1 tablet, 1 tablet, Oral, QHS PRN, Elmer Picker, NP     Patients Current Diet:  Diet Order                  Diet heart  healthy/carb modified Room service appropriate? Yes with Assist; Fluid consistency: Thin  Diet effective now                       Precautions / Restrictions Precautions Precautions: Fall Precaution Comments: a line L wrist Restrictions Weight Bearing Restrictions: No    Has the patient had 2 or more falls or a fall with injury in the past year? No   Prior Activity Level Community (5-7x/wk): lived alone, independent, driving   Prior Functional Level  Self Care: Did the patient need help bathing, dressing, using the toilet or eating? Independent   Indoor Mobility: Did the patient need assistance with walking from room to room (with or without device)? Independent   Stairs: Did the patient need assistance with internal or external stairs (with or without device)? Independent   Functional Cognition: Did the patient need help planning regular tasks such as shopping or remembering to take medications? Independent   Patient Information Are you of Hispanic, Latino/a,or Spanish origin?: A. No, not of Hispanic, Latino/a, or Spanish origin What is your race?: A. White Do you need or want an interpreter to communicate with a doctor or health care staff?: 0. No   Patient's Response To:  Health Literacy and Transportation Is the patient able to respond to health literacy and transportation needs?: Yes Health Literacy - How often do you need to have someone help you when you read instructions, pamphlets, or other written material from your doctor or pharmacy?: Sometimes In the past 12 months, has lack of transportation kept you from medical appointments or from getting medications?: No In the past 12 months, has lack of transportation kept you from meetings, work, or from getting things needed for daily living?: No   Journalist, newspaper / Equipment Home Assistive Devices/Equipment: None   Prior Device Use: Indicate devices/aids used by the patient prior to current illness,  exacerbation or injury? None of the above   Current Functional Level Cognition   Arousal/Alertness: Awake/alert Overall Cognitive Status: Impaired/Different from baseline Current Attention Level: Sustained Orientation Level: Oriented to person, Oriented to place Following Commands: Follows one step commands consistently Safety/Judgement: Decreased awareness of deficits General Comments: stating 1977 and no guess as to location, stating "im here to get help" once re-oriented to hospital Attention:  (sustained attention appeared [facility] for tasks) Awareness: Impaired Awareness Impairment: Intellectual impairment, Emergent impairment Problem Solving: Impaired Problem Solving Impairment: Functional basic Safety/Judgment: Impaired    Extremity Assessment (includes Sensation/Coordination)   Upper Extremity Assessment: Generalized weakness, Right hand dominant, RUE deficits/detail RUE Deficits / Details: decreased sensation in R arm, tremulous with all movement RUE Sensation: decreased light touch RUE Coordination: decreased fine motor, decreased gross motor  Lower Extremity Assessment: Defer to PT evaluation RLE Deficits / Details: 3/5, Pt with c/o R knee pain, with ROM, WB, and palpation. Noted bruising at patella. RLE: Unable to fully assess due to pain     ADLs   Overall ADL's : Needs assistance/impaired Eating/Feeding: Set up, Sitting Grooming: Wash/dry hands, Wash/dry face, Oral care, Brushing hair, Contact guard assist, Standing, Cueing for sequencing Grooming Details (indicate cue type and reason): required cues for sequencing Upper Body Bathing: Set up, Cueing for sequencing, Standing Lower Body Bathing: Moderate assistance, Sit to/from stand, Sitting/lateral leans Upper Body Dressing : Supervision/safety, Sitting Lower Body Dressing: Moderate assistance, Maximal assistance, Sitting/lateral leans, Sit to/from stand Lower Body Dressing Details (indicate cue type and reason): able to  doff socks, unable to [employer] Transfer: Minimal assistance, Rolling walker (2 wheels) Toilet Transfer Details (indicate cue type and reason): simulated Toileting- Clothing Manipulation and Hygiene: Moderate assistance, Sit to/from stand, Sitting/lateral lean Functional mobility during ADLs: Moderate assistance, +2 for physical assistance, +2 for safety/equipment, Cueing for sequencing General ADL Comments: making good gains with self care tasks     Mobility   Overal bed mobility: Needs Assistance Bed Mobility: Supine to Sit Supine to sit: Min assist, HOB elevated, Used rails Sit to supine: Mod assist, Used rails General bed mobility  comments: cues for rail use and assistance to pull up     Transfers   Overall transfer level: Needs assistance Equipment used: Rolling walker (2 wheels) Transfers: Sit to/from Stand, Bed to chair/wheelchair/BSC Sit to Stand: Min assist Bed to/from chair/wheelchair/BSC transfer type:: Via Financial planner via Lift Equipment: [employer] transfer comment: cues for hand placement and safety with min assist to power up     Ambulation / Gait / Stairs / Wheelchair Mobility   Ambulation/Gait Ambulation/Gait assistance: Contact guard assist, Min assist Gait Distance (Feet): 110 Feet Assistive device: Rolling walker (2 wheels) Gait Pattern/deviations: Trunk flexed, Shuffle, Decreased weight shift to right General Gait Details: cues for increased RLE clearance throughout and upright posture as pt with tendency for downward gaze Gait velocity: decr Gait velocity interpretation: <1.31 ft/sec, indicative of household ambulator Pre-gait activities: marching in place, 10x R and 10x L     Posture / Balance Dynamic Sitting Balance Sitting balance - Comments: EOB Balance Overall balance assessment: Needs assistance Sitting-balance support: Feet supported, Single extremity supported Sitting balance-Leahy Scale: Good Sitting balance - Comments:  EOB Standing balance support: Single extremity supported, Bilateral upper extremity supported, During functional activity Standing balance-Leahy Scale: Poor Standing balance comment: reliant of RW support     Special needs/care consideration Hgb A1c 7.1    Previous Home Environment  Living Arrangements: Alone  Lives With: Alone Available Help at Discharge: Family, Available 24 hours/day (son and nephew) Type of Home: House Home Layout: One level Home Access: Stairs to enter Secretary/administrator of Steps: 6 Bathroom Shower/Tub: Engineer, manufacturing systems: Standard Bathroom Accessibility: Yes How Accessible: Accessible via walker Home Care Services: No Additional Comments: clairifed with son   Discharge Living Setting Plans for Discharge Living Setting: Patient's home, Alone, House Type of Home at Discharge: House Discharge Home Layout: One level Discharge Home Access: Stairs to enter Entrance Stairs-Rails: None Entrance Stairs-Number of Steps: 6 Discharge Bathroom Shower/Tub: Tub/shower unit Discharge Bathroom Toilet: Standard Discharge Bathroom Accessibility: Yes How Accessible: Accessible via walker Does the patient have any problems obtaining your medications?: No   Social/Family/Support Systems Patient Roles: Parent Contact Information: son, Griffyn Kucinski Cower Anticipated Caregiver: son and nephew Anticipated Caregiver's Contact Information: see contacts Ability/Limitations of Caregiver: son works, but will take time off and nephew can asisst Caregiver Availability: 24/7 Discharge Plan Discussed with Primary Caregiver: Yes Is Caregiver In Agreement with Plan?: Yes Does Caregiver/Family have Issues with Lodging/Transportation while Pt is in Rehab?: No   Goals Patient/Family Goal for Rehab: supervision PT, supervision to min assist OT, supervision SLP Expected length of stay: ELOS 10 to 12 days Pt/Family Agrees to Admission and willing to participate: Yes Program  Orientation Provided & Reviewed with Pt/Caregiver Including Roles  & Responsibilities: Yes   Decrease burden of Care through IP rehab admission: n/a   Possible need for SNF placement upon discharge: not anticipated   Patient Condition: I have reviewed medical records from [facility], spoken with patient and son. I met with patient at the bedside for inpatient rehabilitation assessment.  Patient will benefit from ongoing PT, OT, and SLP, can actively participate in 3 hours of therapy a day 5 days of the week, and can make measurable gains during the admission.  Patient will also benefit from the coordinated team approach during an Inpatient Acute Rehabilitation admission.  The patient will receive intensive therapy as well as Rehabilitation physician, nursing, social worker, and care management interventions.  Due to bladder management, bowel management, safety, skin/wound care,  disease management, medication administration, pain management, and patient education the patient requires 24 hour a day rehabilitation nursing.  The patient is currently min assist overall with mobility and basic ADLs.  Discharge setting and therapy post discharge at home with home health is anticipated.  Patient has agreed to participate in the Acute Inpatient Rehabilitation Program and will admit today.   Preadmission Screen Completed By:  Clois Dupes, RN MSN 07/11/2023 2:21 PM ______________________________________________________________________   Discussed status with Dr. Carlis Abbott on 07/11/23 at 1422 and received approval for admission today.   Admission Coordinator:  Clois Dupes, RN MSN time [id number] Date 07/11/23    Assessment/Plan: Diagnosis: CVA Does the need for close, 24 hr/day Medical supervision in concert with the patient's rehab needs make it unreasonable for this patient to be served in a less intensive setting? Yes Co-Morbidities requiring supervision/potential complications:  overweight, arthritis, afib, diabetes, HTN Due to bladder management, bowel management, safety, skin/wound care, disease management, medication administration, pain management, and patient education, does the patient require 24 hr/day rehab nursing? Yes Does the patient require coordinated care of a physician, rehab nurse, PT, OT, and SLP to address physical and functional deficits in the context of the above medical diagnosis(es)? Yes Addressing deficits in the following areas: balance, endurance, locomotion, strength, transferring, bowel/bladder control, bathing, dressing, feeding, grooming, toileting, speech, and language Can the patient actively participate in an intensive therapy program of at least 3 hrs of therapy 5 days a week? Yes The potential for patient to make measurable gains while on inpatient rehab is excellent Anticipated functional outcomes upon discharge from inpatient rehab: supervision PT, supervision OT, supervision SLP Estimated rehab length of stay to reach the above functional goals is: 5-7 days Anticipated discharge destination: Home 10. Overall Rehab/Functional Prognosis: excellent     MD Signature: Sula Soda          Revision History  Routing History

## 2023-07-12 NOTE — Progress Notes (Signed)
Inpatient Rehabilitation Care Coordinator Assessment and Plan Patient Details  Name: Cory Brown MRN: [id number] Date of Birth: 07/12/1951  Today's Date: 07/12/2023  Hospital Problems: Principal Problem:   Acute ischemic left internal carotid artery (ICA) stroke [facility])  Past Medical History:  Past Medical History:  Diagnosis Date   Acute bronchitis, unspecified 01/16/2022   Acute ischemic stroke (HCC) 12/05/2021   Arthritis    Atrial fibrillation (HCC)    Carpal tunnel syndrome 01/16/2022   Cerebral infarction, unspecified (HCC) 01/16/2022   Cerebrovascular accident [facility]) 01/16/2022   Dec 27, 2021 Entered By: [facility] D Comment: Left MCA stroke s/p thrombectomy, 12/05/2021.   Chronic atrial fibrillation (HCC) 09/17/2021   Coronary artery disease 01/16/2022   Dec 27, 2021 Entered By: [facility] D Comment: S/p stent Obtuse marginal 2, 10/2013.   Counseling, unspecified 01/16/2022   Diabetes mellitus without complication (HCC)    Essential (primary) hypertension 01/16/2022   Generalized weakness 09/17/2021   Hypercholesterolemia    Hyperglycemia due to diabetes mellitus (HCC) 09/17/2021   Hyperlipidemia 01/16/2022   Hypertension    Hyponatremia 09/17/2021   Kidney stones    Low back pain 01/16/2022   MI (myocardial infarction) [facility])    Muscle weakness (generalized) 01/16/2022   Neck pain 01/16/2022   Non-traumatic rhabdomyolysis 09/19/2021   Special screening for malignant neoplasms, colon 11/30/2009   May 05, 2010 Entered By: [facility] D Comment: Normal per pt   Stroke (cerebrum) (HCC) 12/05/2021   Stroke (HCC) 2010   Thrombocytopenia (HCC) 09/18/2021   Traumatic rhabdomyolysis (HCC) 09/17/2021   Tremor    Ulnar neuropathy 01/16/2022   Past Surgical History:  Past Surgical History:  Procedure Laterality Date   cardiac stents  2005   CHOLECYSTECTOMY  2007   IR CT HEAD LTD  12/05/2021   IR CT HEAD LTD  07/08/2023   IR PERCUTANEOUS ART THROMBECTOMY/INFUSION INTRACRANIAL INC DIAG  ANGIO  12/05/2021   IR PERCUTANEOUS ART THROMBECTOMY/INFUSION INTRACRANIAL INC DIAG ANGIO  07/08/2023   IR US GUIDE VASC ACCESS RIGHT  07/08/2023   KIDNEY STONE SURGERY  1997-2003   RADIOLOGY WITH ANESTHESIA N/A 12/05/2021   Procedure: RADIOLOGY WITH ANESTHESIA;  Surgeon: Radiologist, Medication, MD;  Location: [facility] OR;  Service: Radiology;  Laterality: N/A;   RADIOLOGY WITH ANESTHESIA N/A 07/08/2023   Procedure: IR WITH ANESTHESIA;  Surgeon: Radiologist, Medication, MD;  Location: [facility] OR;  Service: Radiology;  Laterality: N/A;   Social History:  reports that he has never smoked. He has never used smokeless tobacco. He reports that he does not drink alcohol and does not use drugs.  Family / Support Systems Marital Status: Single Patient Roles: Parent Spouse/Significant Other: n/a Children: Kathi Ludwig Other Supports: N/A Anticipated Caregiver: Son and Nephew Ability/Limitations of Caregiver: Son works able to take some time off. Nephew able to assist at times Caregiver Availability: 24/7 Family Dynamics: supportive son  Social History Preferred language: English Religion: Unknown Cultural Background: n/a Education: n/a Health Literacy - How often do you need to have someone help you when you read instructions, pamphlets, or other written material from your doctor or pharmacy?: Sometimes Writes: Yes Employment Status: Disabled Date Retired/Disabled/Unemployed: n/ Name of Employer: n/a Return to Work Plans: n/a Marine scientist Issues: n/a Guardian/Conservator: n/a   Abuse/Neglect Abuse/Neglect Assessment Can Be Completed: Yes Physical Abuse: Denies Verbal Abuse: Denies Sexual Abuse: Denies Exploitation of patient/patient's resources: Denies Self-Neglect: Denies  Patient response to: Social Isolation - How often do you feel lonely or isolated from those around you?:  Never  Emotional Status Pt's affect, behavior and adjustment status: Pleasant Recent Psychosocial Issues:  Coping Psychiatric History: N/A Substance Abuse History: N/A  Patient / Family Perceptions, Expectations & Goals Pt/Family understanding of illness & functional limitations: yes, spoke with son Barbara Cower Premorbid pt/family roles/activities: Living alone and independent overall and driving Anticipated changes in roles/activities/participation: Son plans to take some time off to provide 24/7 supervision. Nephew able to assist when son working Pt/family expectations/goals: Sup/Min A  [employer]: None Premorbid Home Care/DME Agencies: None Transportation available at discharge: son/nephew Is the patient able to respond to transportation needs?: Yes In the past 12 months, has lack of transportation kept you from medical appointments or from getting medications?: No In the past 12 months, has lack of transportation kept you from meetings, work, or from getting things needed for daily living?: No Resource referrals recommended: Neuropsychology  Discharge Planning Living Arrangements: Alone Support Systems: Children, Other relatives Type of Residence: Private residence (1 level home, 6 steps) Insurance Resources: Media planner (specify) ([place]) Financial Resources: Restaurant manager, fast food Screen Referred: No Living Expenses: Own Money Management: Patient Does the patient have any problems obtaining your medications?: No Home Management: indepndent Patient/Family Preliminary Plans: Son or nephew able to assist Care Coordinator Barriers to Discharge: Lack of/limited family support, Decreased caregiver support, Insurance for SNF coverage Care Coordinator Anticipated Follow Up Needs: HH/OP Expected length of stay: 10-12 Days  Clinical Impression SW met with patient introduced self and explained role. Patient called pt's son, Barbara Cower. Barbara Cower concerned about pt having some chaffing and redness and skin is a little raw. SW discussed with RN and they are addressing with  barrier cream. Son will be present this evening. Patient living alone prior plans to d/c back home with son to take some time off to assist with supervision. Pt's nephew able to assist at times. I level home 6 steps to enter. No additional questions or concerns.  Andria Rhein 07/12/2023, 1:24 PM

## 2023-07-12 NOTE — Plan of Care (Signed)
  Problem: RH Balance Goal: LTG Patient will maintain dynamic sitting balance (PT) Description: LTG:  Patient will maintain dynamic sitting balance with assistance during mobility activities (PT) Flowsheets (Taken 07/12/2023 1643) LTG: Pt will maintain dynamic sitting balance during mobility activities with:: Supervision/Verbal cueing Goal: LTG Patient will maintain dynamic standing balance (PT) Description: LTG:  Patient will maintain dynamic standing balance with assistance during mobility activities (PT) Flowsheets (Taken 07/12/2023 1643) LTG: Pt will maintain dynamic standing balance during mobility activities with:: Supervision/Verbal cueing   Problem: Sit to Stand Goal: LTG:  Patient will perform sit to stand with assistance level (PT) Description: LTG:  Patient will perform sit to stand with assistance level (PT) Flowsheets (Taken 07/12/2023 1643) LTG: PT will perform sit to stand in preparation for functional mobility with assistance level: Supervision/Verbal cueing   Problem: RH Bed Mobility Goal: LTG Patient will perform bed mobility with assist (PT) Description: LTG: Patient will perform bed mobility with assistance, with/without cues (PT). Flowsheets (Taken 07/12/2023 1643) LTG: Pt will perform bed mobility with assistance level of: Supervision/Verbal cueing   Problem: RH Bed to Chair Transfers Goal: LTG Patient will perform bed/chair transfers w/assist (PT) Description: LTG: Patient will perform bed to chair transfers with assistance (PT). Flowsheets (Taken 07/12/2023 1643) LTG: Pt will perform Bed to Chair Transfers with assistance level: Supervision/Verbal cueing   Problem: RH Car Transfers Goal: LTG Patient will perform car transfers with assist (PT) Description: LTG: Patient will perform car transfers with assistance (PT). Flowsheets (Taken 07/12/2023 1643) LTG: Pt will perform car transfers with assist:: Supervision/Verbal cueing   Problem: RH Ambulation Goal: LTG  Patient will ambulate in controlled environment (PT) Description: LTG: Patient will ambulate in a controlled environment, # of feet with assistance (PT). Flowsheets (Taken 07/12/2023 1643) LTG: Pt will ambulate in controlled environ  assist needed:: Supervision/Verbal cueing LTG: Ambulation distance in controlled environment: 150 feet with LRAD Goal: LTG Patient will ambulate in home environment (PT) Description: LTG: Patient will ambulate in home environment, # of feet with assistance (PT). Flowsheets (Taken 07/12/2023 1643) LTG: Pt will ambulate in home environ  assist needed:: Supervision/Verbal cueing LTG: Ambulation distance in home environment: 75 feet with LRAD   Problem: RH Stairs Goal: LTG Patient will ambulate up and down stairs w/assist (PT) Description: LTG: Patient will ambulate up and down # of stairs with assistance (PT) Flowsheets (Taken 07/12/2023 1643) LTG: Pt will ambulate up/down stairs assist needed:: Supervision/Verbal cueing LTG: Pt will  ambulate up and down number of stairs: 6 or more steps per home set up with LRAD

## 2023-07-12 NOTE — Evaluation (Signed)
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   Chronic atrial fibrillation (HCC) 09/17/2021   Coronary artery disease 01/16/2022   Dec 27, 2021 Entered By: [facility] D Comment: S/p stent Obtuse marginal 2, 10/2013.   Counseling, unspecified 01/16/2022   Diabetes mellitus without complication (HCC)    Essential (primary) hypertension 01/16/2022   Generalized weakness 09/17/2021   Hypercholesterolemia    Hyperglycemia due to diabetes mellitus (HCC) 09/17/2021   Hyperlipidemia 01/16/2022   Hypertension    Hyponatremia 09/17/2021   Kidney stones    Low back pain 01/16/2022   MI (myocardial infarction) [facility])    Muscle weakness (generalized) 01/16/2022   Neck pain 01/16/2022   Non-traumatic rhabdomyolysis 09/19/2021   Special screening for malignant neoplasms, colon 11/30/2009   May 05, 2010 Entered By: [facility] D Comment: Normal per pt   Stroke (cerebrum) (HCC) 12/05/2021   Stroke (HCC) 2010   Thrombocytopenia (HCC) 09/18/2021   Traumatic rhabdomyolysis (HCC) 09/17/2021   Tremor    Ulnar neuropathy 01/16/2022   Past Surgical History:  Past Surgical History:  Procedure Laterality Date   cardiac stents  2005    CHOLECYSTECTOMY  2007   IR CT HEAD LTD  12/05/2021   IR CT HEAD LTD  07/08/2023   IR PERCUTANEOUS ART THROMBECTOMY/INFUSION INTRACRANIAL INC DIAG ANGIO  12/05/2021   IR PERCUTANEOUS ART THROMBECTOMY/INFUSION INTRACRANIAL INC DIAG ANGIO  07/08/2023   IR US GUIDE VASC ACCESS RIGHT  07/08/2023   KIDNEY STONE SURGERY  1997-2003   RADIOLOGY WITH ANESTHESIA N/A 12/05/2021   Procedure: RADIOLOGY WITH ANESTHESIA;  Surgeon: Radiologist, Medication, MD;  Location: [facility] OR;  Service: Radiology;  Laterality: N/A;   RADIOLOGY WITH ANESTHESIA N/A 07/08/2023   Procedure: IR WITH ANESTHESIA;  Surgeon: Radiologist, Medication, MD;  Location: [facility] OR;  Service: Radiology;  Laterality: N/A;   Assessment / Plan / Recommendation Clinical Impression History of Present Illness: 72 year old male with history of left MCA stroke s/p mechanical thrombectomy, HTN, 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 Found on the ground by a neighbor and noted that he was weaker on his right side and slurred speech. CTA showed left ICA occlusion. Taken for mechanical thrombectomy, TICI 3 achieved. MRI scattered foci of acute infarct throughout the left MCA and bilateral ACA. MRA with no LVO or high grade stenosis. 2 d echo EF 60 to 65%, Mild LVH, elevated pulmonary artery systolic pressure, severely dilated left atria, severely dilated right atria, trivial MVR mild calcification of the aortic valve.    Swallowing: Patient presents with swallowing function that appears to be [facility] with no overt difficulty nor s/sx of penetration/aspiration demonstrated upon bedside evaluation. Patient's pulmonary status = breathing comfortably on  room air per most recent MD note, patient is afebrile, WBC count is WFL, and no hx of pneumonia noted from basic chart review. Recommend continuation of regular diet with thin liquids, medications administered whole with thin. No further indications for  swallowing intervention during CIR admission.   Cognitive Linguistic Evaluation: SLP administered bedside WAB and SLUMS Examination to determine current cognitive linguistic status. At baseline, patient lives alone and manages all ADLs/iADLs independently. Currently, patient exhibits mild anomic aphasia with expressive deficits>receptive, though mild receptive deficits noting during complex conversation and two/three-step command following. Patient comprehension intact for basic conversation but breakdowns occur when more complex language is utilized by conversation partner. Expressively, patient exhibits semantic paraphasias, mildly empty speech, functional word finding difficulty, and mild naming deficits resulting in intermittent message confusion. Cognitively, language function may have some impact on SLUMS results, though suspect cognitive impairments play larger role than language. Patient demonstrated reduced short term memory, attention, working memory, and visuospatial/constructional skills resulting in score of 9/30 on SLUMS. Of note, WFL for <high school education = 25. Patient would benefit from SLP services during CIR admission to target cognitive linguistic deficits. Patient in agreement with this plan and consulted during goal forming process.    Skilled Therapeutic Interventions          SLP conducted skilled evaluation to assess swallowing function, language, and cognition as outlined above. Bedside swallow evaluation, bedside Western Aphasia Battery, and SLUMS Examination were administered.    SLP Assessment  Patient will need skilled Speech Lanaguage Pathology Services during CIR admission    Recommendations  SLP Diet Recommendations: Age appropriate regular solids;Thin Liquid Administration via: Spoon;Cup;Straw Medication Administration: Whole meds with liquid Supervision: Patient able to self feed Postural Changes and/or Swallow Maneuvers: Seated upright 90 degrees Oral Care  Recommendations: Oral care BID Recommendations for Other Services: Neuropsych consult Patient destination: Home Follow up Recommendations: Outpatient SLP Equipment Recommended: None recommended by SLP    SLP Frequency 1 to 3 out of 7 days   SLP Duration  SLP Intensity  SLP Treatment/Interventions 3 weeks  Minumum of 1-2 x/day, 30 to 90 minutes  Cognitive remediation/compensation;Multimodal communication approach;Speech/Language facilitation;Cueing hierarchy;Functional tasks;Therapeutic Activities;Internal/external aids;Patient/family education    Pain Pain Assessment Pain Scale: 0-10 Pain Score: 0-No pain  Prior Functioning Cognitive/Linguistic Baseline: Within functional limits Type of Home: House  Lives With: Alone Available Help at Discharge: Family;Available 24 hours/day Education: 10th grade + trade school 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 Overall Cognitive Status: Impaired/Different from baseline Arousal/Alertness: Awake/alert Orientation Level: Oriented to person;Disoriented to situation;Disoriented to person;Disoriented to place;Disoriented to time Year: 2024 Month: October Day of Week: Incorrect Attention: Sustained;Selective Sustained Attention: Impaired Sustained Attention Impairment: Functional basic;Verbal basic Selective Attention: Impaired Selective Attention Impairment: Functional basic;Verbal basic Memory: Impaired Memory Impairment: Storage deficit;Retrieval deficit;Decreased recall of new information;Decreased short term memory Decreased Short Term Memory: Verbal basic Awareness: Impaired Awareness Impairment: Intellectual impairment;Emergent impairment;Anticipatory impairment Problem Solving: Impaired Problem Solving Impairment: Functional basic;Verbal basic Executive Function: Self Monitoring;Self Correcting Self Monitoring: Impaired Self Monitoring Impairment: Verbal basic;Functional basic Self Correcting:  Impaired Self Correcting Impairment: Verbal basic;Functional basic Safety/Judgment: Impaired  Comprehension Auditory Comprehension Overall Auditory Comprehension: Impaired Yes/No Questions: Within Functional Limits Commands: Impaired One Step Basic Commands: 75-100% accurate Two Step Basic Commands: 50-74% accurate Multistep Basic Commands: 50-74% accurate Conversation: Simple Interfering Components: Working Radio broadcast assistant: Chief of Staff Discrimination: Within Function Limits Reading Comprehension Reading Status: Impaired Word level: Within functional limits Sentence Level: Within functional limits Paragraph  Level: Impaired Functional Environmental (signs, name badge): Within functional limits Interfering Components: Eye glasses not available Effective Techniques: Eye glasses;Verbal cueing;Visual cueing Expression Expression Primary Mode of Expression: Verbal Verbal Expression Overall Verbal Expression: Impaired Initiation: No impairment Automatic Speech: Name;Social Response Level of Generative/Spontaneous Verbalization: Phrase;Sentence Repetition: No impairment Naming: Impairment Responsive: Not tested Confrontation: Impaired Convergent: Not tested Divergent: Not tested Other Naming Comments: generative naming task on SLUMS - 3 animals in 1 minute Pragmatics: Impairment Impairments: Abnormal affect;Eye contact Non-Verbal Means of Communication: Not applicable Written Expression Dominant Hand: Right Written Expression: Exceptions to [facility] Self Formulation Ability: Phrase Oral Motor Oral Motor/Sensory Function Overall Oral Motor/Sensory Function: Mild impairment Facial ROM: Within Functional Limits Facial Symmetry: Within Functional Limits Facial Strength: Within Functional Limits Facial Sensation: Within Functional Limits Lingual ROM: Within Functional Limits Lingual Symmetry: Within  Functional Limits Lingual Strength: Within Functional Limits Lingual Sensation: Within Functional Limits Velum: Within Functional Limits Mandible: Within Functional Limits Motor Speech Overall Motor Speech: Appears within functional limits for tasks assessed Respiration: Within functional limits Phonation: Low vocal intensity Resonance: Within functional limits Articulation: Within functional limitis Intelligibility: Intelligible Conversation: 75-100% accurate Motor Planning: Witnin functional limits Motor Speech Errors: Not applicable Interfering Components: Inadequate dentition  Care Tool Care Tool Cognition Ability to hear (with hearing aid or hearing appliances if normally used Ability to hear (with hearing aid or hearing appliances if normally used): 0. Adequate - no difficulty in normal conservation, social interaction, listening to TV   Expression of Ideas and Wants Expression of Ideas and Wants: 3. Some difficulty - exhibits some difficulty with expressing needs and ideas (e.g, some words or finishing thoughts) or speech is not clear   Understanding Verbal and Non-Verbal Content Understanding Verbal and Non-Verbal Content: 3. Usually understands - understands most conversations, but misses some part/intent of message. Requires cues at times to understand  Memory/Recall Ability Memory/Recall Ability : Current season;That he or she is in a hospital/hospital unit   Intelligibility: Intelligible Conversation: 75-100% accurate  Bedside Swallowing Assessment General Date of Onset: 07/12/23 Previous Swallow Assessment: n/a Diet Prior to this Study: Regular;Thin liquids (Level 0) Temperature Spikes Noted: No Respiratory Status: Room air History of Recent Intubation: No Behavior/Cognition: Alert;Cooperative;Pleasant mood Oral Cavity - Dentition: Poor condition Self-Feeding Abilities: Able to feed self Patient Positioning: Upright in chair/Tumbleform Baseline Vocal Quality: Low  vocal intensity Volitional Cough: Weak Volitional Swallow: Able to elicit  Oral Care Assessment Oral Assessment  (WDL): Exceptions to WDL Lips: Symmetrical Teeth: Poor dental hygiene Tongue: Pink;Moist Mucous Membrane(s): Moist;Pink Saliva: Moist, saliva free flowing Level of Consciousness: Alert Is patient on any of following O2 devices?: None of the above Nutritional status: No high risk factors Oral Assessment Risk : Low Risk Ice Chips Ice chips: Not tested Thin Liquid Thin Liquid: Within functional limits Nectar Thick Nectar Thick Liquid: Not tested Honey Thick Honey Thick Liquid: Not tested Puree Puree: Not tested Solid Solid: Within functional limits BSE Assessment Risk for Aspiration Impact on safety and function: No limitations Other Related Risk Factors: Previous CVA;Cognitive impairment  Short Term Goals: Week 1: SLP Short Term Goal 1 (Week 1): Patient will demonstrate orientation to person, place, time, and situation with minA and use of external aids SLP Short Term Goal 2 (Week 1): Patient will follow two-step functional directions with 80% accuracy given minA SLP Short Term Goal 3 (Week 1): Patient will communicate mildly complex thoughts and ideas in 4/5 opportunities utilizing provided expressive language strategies with modA SLP Short Term Goal 4 (  Week 1): Patient will recall basic daily events utilizing provided memory strategies as needed given modA SLP Short Term Goal 5 (Week 1): Patient will solve basic environmental problems in 4/5 opportunities given modA  Refer to Care Plan for Long Term Goals  Recommendations for other services: Neuropsych  Discharge Criteria: Patient will be discharged from SLP if patient refuses treatment 3 consecutive times without medical reason, if treatment goals not met, if there is a change in medical status, if patient makes no progress towards goals or if patient is discharged from hospital.  The above assessment,  treatment plan, treatment alternatives and goals were discussed and mutually agreed upon: by patient  [name], M.A., CCC-SLP  [name] 07/12/2023, 1:37 PM

## 2023-07-12 NOTE — Progress Notes (Signed)
Inpatient Rehabilitation  Patient information reviewed and entered into eRehab system by Melissa M. Bowie, M.A., CCC/SLP, PPS Coordinator.  Information including medical coding, functional ability and quality indicators will be reviewed and updated through discharge.    

## 2023-07-12 NOTE — Plan of Care (Signed)
  Problem: RH Cognition - SLP Goal: RH LTG Patient will demonstrate orientation with cues Description:  LTG:  Patient will demonstrate orientation to person/place/time/situation with cues (SLP)   Flowsheets (Taken 07/12/2023 1351) LTG Patient will demonstrate orientation to:  Person  Place  Time  Situation LTG: Patient will demonstrate orientation using cueing (SLP): Supervision   Problem: RH Comprehension Communication Goal: LTG Patient will comprehend basic/complex auditory (SLP) Description: LTG: Patient will comprehend basic/complex auditory information with cues (SLP). Flowsheets (Taken 07/12/2023 1351) LTG: Patient will comprehend: Basic auditory information LTG: Patient will comprehend auditory information with cueing (SLP): Supervision   Problem: RH Expression Communication Goal: LTG Patient will verbally express basic/complex needs(SLP) Description: LTG:  Patient will verbally express basic/complex needs, wants or ideas with cues  (SLP) Flowsheets (Taken 07/12/2023 1351) LTG: Patient will verbally express basic/complex needs, wants or ideas (SLP): Supervision   Problem: RH Problem Solving Goal: LTG Patient will demonstrate problem solving for (SLP) Description: LTG:  Patient will demonstrate problem solving for basic/complex daily situations with cues  (SLP) Flowsheets (Taken 07/12/2023 1351) LTG: Patient will demonstrate problem solving for (SLP): Basic daily situations LTG Patient will demonstrate problem solving for: Supervision   Problem: RH Memory Goal: LTG Patient will follow step by step directions w/cues (SLP) Description: LTG: Patient will follow step by step directions with cues (SLP). Flowsheets (Taken 07/12/2023 1351) LTG: Patient will follow step by step directions: 2 steps LTG: Patient will follow step by step directions w/cues: Supervision Goal: LTG Patient will demonstrate ability for day to day (SLP) Description: LTG:   Patient will demonstrate ability for  day to day recall/carryover during cognitive/linguistic activities with assist  (SLP) Flowsheets (Taken 07/12/2023 1351) LTG: Patient will demonstrate ability for day to day recall: New information LTG: Patient will demonstrate ability for day to day recall/carryover during cognitive/linguistic activities with assist (SLP): Supervision

## 2023-07-12 NOTE — Evaluation (Addendum)
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   Chronic atrial fibrillation (HCC) 09/17/2021   Coronary artery disease 01/16/2022   Dec 27, 2021 Entered By: [facility] D Comment: S/p stent Obtuse marginal 2, 10/2013.   Counseling, unspecified 01/16/2022   Diabetes mellitus without complication (HCC)    Essential (primary) hypertension 01/16/2022   Generalized weakness 09/17/2021   Hypercholesterolemia    Hyperglycemia due to diabetes mellitus (HCC) 09/17/2021   Hyperlipidemia 01/16/2022   Hypertension    Hyponatremia 09/17/2021   Kidney stones    Low back pain 01/16/2022   MI (myocardial infarction) [facility])    Muscle weakness (generalized) 01/16/2022   Neck pain 01/16/2022   Non-traumatic rhabdomyolysis 09/19/2021   Special screening for malignant neoplasms, colon 11/30/2009   May 05, 2010 Entered By: [facility] D Comment: Normal per pt   Stroke (cerebrum) (HCC) 12/05/2021   Stroke (HCC) 2010   Thrombocytopenia (HCC) 09/18/2021   Traumatic rhabdomyolysis (HCC) 09/17/2021   Tremor    Ulnar neuropathy 01/16/2022   Past Surgical  History:  Past Surgical History:  Procedure Laterality Date   cardiac stents  2005   CHOLECYSTECTOMY  2007   IR CT HEAD LTD  12/05/2021   IR CT HEAD LTD  07/08/2023   IR PERCUTANEOUS ART THROMBECTOMY/INFUSION INTRACRANIAL INC DIAG ANGIO  12/05/2021   IR PERCUTANEOUS ART THROMBECTOMY/INFUSION INTRACRANIAL INC DIAG ANGIO  07/08/2023   IR US GUIDE VASC ACCESS RIGHT  07/08/2023   KIDNEY STONE SURGERY  1997-2003   RADIOLOGY WITH ANESTHESIA N/A 12/05/2021   Procedure: RADIOLOGY WITH ANESTHESIA;  Surgeon: Radiologist, Medication, MD;  Location: [facility] OR;  Service: Radiology;  Laterality: N/A;   RADIOLOGY WITH ANESTHESIA N/A 07/08/2023   Procedure: IR WITH ANESTHESIA;  Surgeon: Radiologist, Medication, MD;  Location: [facility] OR;  Service: Radiology;  Laterality: N/A;    Assessment & Plan Clinical Impression: Cory Brown is a 72 year old RH-male with history of L-MCA stroke w/residual mild RLE weakness, chronic LBP/neck pain, CAD s/p PTCA,  PPM, 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 He was found to have right sided weakness, right facial droop and slurred speech. UDS negative.  CTA head/neck with perfusion showed left ICA occluded at origin with thrombus except tending to level of ICA terminus, moderate narrowing mid P2 segment of right PCA and perfusion abnormality in the region of the left basal ganglia.  He underwent cerebral angio with mechanical thrombectomy of left ICA with complete recanalization by Dr. Lennox Laity to [name].  Follow-up MRI/MRI brain revealed scattered foci of acute infarct throughout the left MCA and bilateral ACA territories, no  acute hemorrhage and no LVO. Chronic right frontal, left MCA and left basal ganglia infarcts and chronic left thalamic hemorrhage. 2D echo showed EF 60-65 % with mild concentric LVH, severe dilatation of b-atria and mild to moderate TVR. Dr. Pearlean Brownie felt that stroke was due to large vessel disease and recommended continuing  Pradaxa as unknown compliance with medication. Patient with multiple strokes and hx of non compliance.     Right knee x-rays done due to pain revealing mild to moderate medial compartment OA and small joint effusion with calcification proximal medial collateral ligament likely due to remote injury.  Right shoulder x-ray showed moderate glenohumeral and acromioclavicular osteoarthritis no change compared to 03/24. 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 Therapy has been working with patient who is limited by right sided weakness and cognitive deficits. CIR recommended due to functional decline. Reports blurred vision.   Patient currently requires max with basic self-care skills and IADL secondary to muscle weakness, muscle joint tightness, and muscle paralysis, decreased cardiorespiratoy endurance, impaired timing and sequencing, abnormal tone, unbalanced muscle activation, motor apraxia, ataxia, decreased coordination, and decreased motor planning, decreased visual perceptual skills and decreased visual motor skills, decreased attention to right and decreased motor planning, decreased initiation, decreased attention, decreased awareness, decreased problem solving, decreased safety awareness, decreased memory, and delayed processing, and decreased sitting balance, decreased standing balance, decreased postural control, hemiplegia, and decreased balance strategies.  Prior to hospitalization, patient could complete  BADL's and simple IADL's with modified independent .  Patient will benefit from skilled intervention to decrease level of assist with basic self-care skills, increase independence with basic self-care skills, and increase level of independence with iADL prior to discharge home with care partner.  Anticipate patient will require 24 hour supervision and minimal physical assistance and follow up home health.   OT Evaluation Precautions/Restrictions   Precautions Precautions: Fall Precaution Comments: aphaisa, delay in processing, R hemi Restrictions Weight Bearing Restrictions: No General Chart Reviewed: Yes Family/Caregiver Present: No Vital Signs Therapy Vitals Temp: 98.6 F (37 C) Temp Source: Oral Pulse Rate: 71 Resp: 14 BP: 137/72 Patient Position (if appropriate): Lying Oxygen Therapy SpO2: 97 % O2 Device: Room Air Pain Pain Assessment Pain Scale: Faces Pain Score: 0-No pain Faces Pain Scale: Hurts a little bit Pain Type: Chronic pain Pain Location: Hand Pain Orientation: Right Pain Descriptors / Indicators: Aching Patients Stated Pain Goal: 0 Pain Intervention(s): Pain med given for lower pain score than stated, per patient request;Hot/Cold interventions Multiple Pain Sites: No Home Living/Prior Functioning Home Living Living Arrangements: Alone Available Help at Discharge: Family, Available 24 hours/day Type of Home: House Home Access: Stairs to enter Secretary/administrator of Steps: 6 Home Layout: One level Bathroom Shower/Tub: Armed forces operational officer Accessibility: Yes Additional Comments: son not present taken from acute, will verify with family when present  Lives With: Alone IADL History Education: 10th grade + trade school Insurance risk surveyor) Prior Function Level of Independence: Independent with basic ADLs, Independent with transfers, Requires assistive device for independence, Independent with homemaking with ambulation  Able to Take Stairs?: Yes Driving: No Vocation: Retired Administrator, sports Baseline Vision/History: 1 Wears glasses Ability to See in Adequate Light: 0 Adequate Patient Visual Report: No change from baseline Vision Assessment?: Vision impaired- to be further tested in functional context Additional Comments: R sided scanning delays noted Perception  Perception: Impaired Praxis Praxis: Impaired Praxis Impairment Details: Motor  planning;Organization;Initiation Cognition Cognition Overall Cognitive Status: Impaired/Different from baseline Arousal/Alertness: Awake/alert Memory: Impaired Memory  Impairment: Storage deficit;Retrieval deficit;Decreased recall of new information;Decreased short term memory Decreased Short Term Memory: Verbal basic Attention: Sustained;Selective Sustained Attention: Impaired Sustained Attention Impairment: Functional basic;Verbal basic Selective Attention: Impaired Selective Attention Impairment: Functional basic;Verbal basic Awareness: Impaired Awareness Impairment: Intellectual impairment;Emergent impairment;Anticipatory impairment Problem Solving: Impaired Problem Solving Impairment: Functional basic;Verbal basic Executive Function: Self Monitoring;Self Correcting Self Monitoring: Impaired Self Monitoring Impairment: Verbal basic;Functional basic Self Correcting: Impaired Self Correcting Impairment: Verbal basic;Functional basic Safety/Judgment: Impaired Brief Interview for Mental Status (BIMS) Repetition of Three Words (First Attempt): 3 Temporal Orientation: Year: Missed by more than 5 years Temporal Orientation: Month: Missed by more than 1 month Temporal Orientation: Day: Incorrect Recall: "Sock": Yes, after cueing ("something to wear") Recall: "Blue": No, could not recall Recall: "Bed": No, could not recall BIMS Summary Score: 4 Sensation Sensation Light Touch: Impaired by gross assessment Hot/Cold: Impaired Detail Hot/Cold Impaired Details: Impaired RUE Proprioception: Impaired by gross assessment Stereognosis: Impaired by gross assessment Additional Comments: light touch appears in tact, pt denies any numbness or tingling, pt reports decreased ability to detect on R LE Coordination Gross Motor Movements are Fluid and Coordinated: No Fine Motor Movements are Fluid and Coordinated: No Coordination and Movement Description: limited by R LE weakness, R hemi Finger  Nose Finger Test: delayed R hand 9 Hole Peg Test: 9 HPT 1 min L hand, 1 min 43 sec R hand Motor  Motor Motor: Hemiplegia Motor - Skilled Clinical Observations: R hemi  Trunk/Postural Assessment    07/12/2023  Cervical Assessment  Cervical Assessment X  Thoracic Assessment  Thoracic Assessment X  Lumbar Assessment  Lumbar Assessment X  Postural Control  Postural Control Deficits on evaluation  Righting Reactions delayed  Protective Responses delayed  Postural Limitations R lean   Balance   07/12/2023  Balance  Balance Assessed Yes  Static Sitting Balance  Static Sitting - Balance Support Bilateral upper extremity supported  Static Sitting - Level of Assistance 5: Stand by assistance  Dynamic Sitting Balance  Dynamic Sitting - Level of Assistance 5: Stand by assistance  Sitting balance - Comments EOB  Static Standing Balance  Static Standing - Level of Assistance 5: Stand by assistance  Dynamic Standing Balance  Dynamic Standing - Level of Assistance 4: Min assist     Extremity/Trunk Assessment RUE Assessment RUE Assessment: Exceptions to [facility] General Strength Comments: R grip 35 lbs with gross strength overall 3-/5 LUE Assessment LUE Assessment: Within Functional Limits General Strength Comments: L grip 55 lbs  Care Tool Care Tool Self Care Eating   Eating Assist Level: Minimal Assistance - Patient > 75%    Oral Care    Oral Care Assist Level: Minimal Assistance - Patient > 75%    Bathing   Body parts bathed by patient: Right arm;Left arm;Chest;Abdomen;Front perineal area;Right upper leg;Left upper leg;Face Body parts bathed by helper: Buttocks;Left lower leg;Right lower leg   Assist Level: Moderate Assistance - Patient 50 - 74%    Upper Body Dressing(including orthotics)   What is the patient wearing?: Pull over shirt   Assist Level: Minimal Assistance - Patient > 75%    Lower Body Dressing (excluding footwear)   What is the patient wearing?:  Underwear/pull up;Pants Assist for lower body dressing: Maximal Assistance - Patient 25 - 49%    Putting on/Taking off footwear   What is the patient wearing?: Socks Assist for footwear: Maximal Assistance - Patient 25 - 49%       Care Tool Toileting Toileting activity   Assist  for toileting: Moderate Assistance - Patient 50 - 74%     Care Tool Bed Mobility Roll left and right activity   Roll left and right assist level: Contact Guard/Touching assist    Sit to lying activity        Lying to sitting on side of bed activity   Lying to sitting on side of bed assist level: the ability to move from lying on the back to sitting on the side of the bed with no back support.: Minimal Assistance - Patient > 75%     Care Tool Transfers Sit to stand transfer   Sit to stand assist level: Minimal Assistance - Patient > 75%    Chair/bed transfer   Chair/bed transfer assist level: Moderate Assistance - Patient 50 - 74%     Toilet transfer   Assist Level: Moderate Assistance - Patient 50 - 74%     Care Tool Cognition  Expression of Ideas and Wants Expression of Ideas and Wants: 3. Some difficulty - exhibits some difficulty with expressing needs and ideas (e.g, some words or finishing thoughts) or speech is not clear  Understanding Verbal and Non-Verbal Content Understanding Verbal and Non-Verbal Content: 3. Usually understands - understands most conversations, but misses some part/intent of message. Requires cues at times to understand   Memory/Recall Ability Memory/Recall Ability : Current season;That he or she is in a hospital/hospital unit   Refer to Care Plan for Long Term Goals  SHORT TERM GOAL WEEK 1 OT Short Term Goal 1 (Week 1): Pt will complete self feeding with dist s including r hand integration and container opening OT Short Term Goal 2 (Week 1): Pt will stand [place] for simple grooming 2 min with CGA and min cues for sequencing OT Short Term Goal 3 (Week 1): Pt will  complete toilet and TTB transfer with min a and min cues with RW support  Recommendations for other services: Neuropsych   Skilled Therapeutic Intervention ADL ADL Eating: Minimal assistance Where Assessed-Eating: Bed level Grooming: Minimal assistance Where Assessed-Grooming: Sitting at sink Upper Body Bathing: Minimal assistance Where Assessed-Upper Body Bathing: Sitting at sink Lower Body Bathing: Maximal assistance Where Assessed-Lower Body Bathing: Standing at sink;Sitting at sink Upper Body Dressing: Minimal assistance Where Assessed-Upper Body Dressing: Sitting at sink Lower Body Dressing: Maximal assistance Where Assessed-Lower Body Dressing: Standing at sink;Sitting at sink Toileting: Moderate assistance Where Assessed-Toileting: Teacher, adult education: Curator Method: Surveyor, minerals: Engineer, technical sales: Moderate assistance Tub/Shower Transfer Method: Stand pivot Tub/Shower Equipment: Insurance underwriter: Insurance underwriter Method: Warden/ranger: Shower seat with back ADL Comments: Cues for initiation, sequencing and scanning, requires support due to R hand dom weakness/., Mobility  Bed Mobility Bed Mobility: Rolling Right;Rolling Left;Supine to Sit Rolling Right: Contact Guard/Touching assist Rolling Left: Contact Guard/Touching assist Supine to Sit: Minimal Assistance - Patient > 75%  OT Intervention/Training:  Pt seen for full initial OT evaluation and training session this am. Pt in bed upon OT arrival. OT introduced role of therapy and purpose of session. Pt open to all presented assessment and training this visit. OT assisted and assessed ADL's, mobility, vision, sensation. cognition/lang, G/FMC, strength and balance throughout session. See above for levels. Pt will benefit from skilled OT services at CIR to maximize function and  safety with recommendation to return home with 24 hr S and S level with home services upon d/c home. Pt left at end  of session in w/c with chair alarm set, tray table and nurse call bell within reach.   Discharge Criteria: Patient will be discharged from OT if patient refuses treatment 3 consecutive times without medical reason, if treatment goals not met, if there is a change in medical status, if patient makes no progress towards goals or if patient is discharged from hospital.  The above assessment, treatment plan, treatment alternatives and goals were discussed and mutually agreed upon: by patient  Vicenta Dunning 07/12/2023, 4:36 PM

## 2023-07-12 NOTE — Plan of Care (Signed)
Problem: RH Balance Goal: LTG: Patient will maintain dynamic sitting balance (OT) Description: LTG:  Patient will maintain dynamic sitting balance with assistance during activities of daily living (OT) Flowsheets (Taken 07/12/2023 1619) LTG: Pt will maintain dynamic sitting balance during ADLs with: Supervision/Verbal cueing Goal: LTG Patient will maintain dynamic standing with ADLs (OT) Description: LTG:  Patient will maintain dynamic standing balance with assist during activities of daily living (OT)  Flowsheets (Taken 07/12/2023 1619) LTG: Pt will maintain dynamic standing balance during ADLs with: Supervision/Verbal cueing   Problem: Sit to Stand Goal: LTG:  Patient will perform sit to stand in prep for activites of daily living with assistance level (OT) Description: LTG:  Patient will perform sit to stand in prep for activites of daily living with assistance level (OT) Flowsheets (Taken 07/12/2023 1619) LTG: PT will perform sit to stand in prep for activites of daily living with assistance level: Supervision/Verbal cueing   Problem: RH Eating Goal: LTG Patient will perform eating w/assist, cues/equip (OT) Description: LTG: Patient will perform eating with assist, with/without cues using equipment (OT) Flowsheets (Taken 07/12/2023 1619) LTG: Pt will perform eating with assistance level of: Independent with assistive device    Problem: RH Grooming Goal: LTG Patient will perform grooming w/assist,cues/equip (OT) Description: LTG: Patient will perform grooming with assist, with/without cues using equipment (OT) Flowsheets (Taken 07/12/2023 1619) LTG: Pt will perform grooming with assistance level of: Independent with assistive device    Problem: RH Bathing Goal: LTG Patient will bathe all body parts with assist levels (OT) Description: LTG: Patient will bathe all body parts with assist levels (OT) Flowsheets (Taken 07/12/2023 1619) LTG: Pt will perform bathing with assistance  level/cueing: Supervision/Verbal cueing   Problem: RH Dressing Goal: LTG Patient will perform upper body dressing (OT) Description: LTG Patient will perform upper body dressing with assist, with/without cues (OT). Flowsheets (Taken 07/12/2023 1619) LTG: Pt will perform upper body dressing with assistance level of: Independent with assistive device Goal: LTG Patient will perform lower body dressing w/assist (OT) Description: LTG: Patient will perform lower body dressing with assist, with/without cues in positioning using equipment (OT) Flowsheets (Taken 07/12/2023 1619) LTG: Pt will perform lower body dressing with assistance level of: Supervision/Verbal cueing   Problem: RH Toileting Goal: LTG Patient will perform toileting task (3/3 steps) with assistance level (OT) Description: LTG: Patient will perform toileting task (3/3 steps) with assistance level (OT)  Flowsheets (Taken 07/12/2023 1619) LTG: Pt will perform toileting task (3/3 steps) with assistance level: Supervision/Verbal cueing   Problem: RH Vision Goal: RH LTG Vision Consulting civil engineer) Flowsheets (Taken 07/12/2023 1619) LTG: Vision Goals: Pt will demonstrate adequate scanning of envoronment during basic mobility and ADL's with min cues for safety.   Problem: RH Functional Use of Upper Extremity Goal: LTG Patient will use RT/LT upper extremity as a (OT) Description: LTG: Patient will use right/left upper extremity as a stabilizer/gross assist/diminished/nondominant/dominant level with assist, with/without cues during functional activity (OT) Flowsheets (Taken 07/12/2023 1619) LTG: Use of upper extremity in functional activities: RUE as diminished level   Problem: RH Toilet Transfers Goal: LTG Patient will perform toilet transfers w/assist (OT) Description: LTG: Patient will perform toilet transfers with assist, with/without cues using equipment (OT) Flowsheets (Taken 07/12/2023 1619) LTG: Pt will perform toilet transfers with assistance  level of: Supervision/Verbal cueing   Problem: RH Tub/Shower Transfers Goal: LTG Patient will perform tub/shower transfers w/assist (OT) Description: LTG: Patient will perform tub/shower transfers with assist, with/without cues using equipment (OT) Flowsheets (Taken 07/12/2023 1619)  LTG: Pt will perform tub/shower stall transfers with assistance level of: Supervision/Verbal cueing

## 2023-07-12 NOTE — Progress Notes (Signed)
Met with patient, oriented to rehab. Discussed binder is for home and to put any resources and other materials received in binder for safe keeping. Updated on evaluations today and therapy will begin tomorrow.  Will be seen by MD every day but may be early AM and if has questions to write down so that will not have to remember on the spot.  Educated on team conference every Wednesday to discuss goals, barriers, discharge date, and equipment needs. Reports that has frequent falls at home.  Reports that was continent at home and incontinence is new.  Educated on time toileting, standing/sitting to void and not lying down, double void, and will do a few bladder scans to ensure that emptying.  Hopefully, this will resolve by incorporating into care. Reports that since this stroke pain has not been an issue. Reports also that he fatigues very quickly and gets worse as the day goes on.  Encourage to stay up in wheelchair between sessions as this will improve his endurance. Discussed foods to increase HDL of 34.  Other cholesterol levels controlled with Crestor. Discussed ways to decrease A1c of 7.1 by reducing the amounts of potato, rice, and pasta.  Offered other food options and dicussed time of day.  Eats fruit as well.  Offered same options but try to eat before 2pm.  Reports that was checking blood sugar 2 x day at home and taking orals only, no insulin.  No education provided on insulin administration as this time.  Discussed aphasia with anomia.  Improved from notes, patient was able to name items in the room clearly with only a slight delay.  All needs met, call bell in reach.

## 2023-07-12 NOTE — Progress Notes (Signed)
[facility] Individual Statement of Services  Patient Name:  Cory Brown  Date:  07/12/2023  Welcome to the [facility].  Our goal is to provide you with an individualized program based on your diagnosis and situation, designed to meet your specific needs.  With this comprehensive rehabilitation program, you will be expected to participate in at least 3 hours of rehabilitation therapies Monday-Friday, with modified therapy programming on the weekends.  Your rehabilitation program will include the following services:  Physical Therapy (PT), Occupational Therapy (OT), 24 hour per day rehabilitation nursing, Therapeutic Recreaction (TR), Neuropsychology, Care Coordinator, Rehabilitation Medicine, Nutrition Services, Pharmacy Services, and Other  Weekly team conferences will be held on Wednesdays to discuss your progress.  Your Inpatient Rehabilitation Care Coordinator will talk with you frequently to get your input and to update you on team discussions.  Team conferences with you and your family in attendance may also be held.  Expected length of stay: 10-12 Days  Overall anticipated outcome: supervision to min assist   Depending on your progress and recovery, your program may change. Your Inpatient Rehabilitation Care Coordinator will coordinate services and will keep you informed of any changes. Your Inpatient Rehabilitation Care Coordinator's name and contact numbers are listed  below.  The following services may also be recommended but are not provided by the [facility]:   Home Health Rehabiltiation Services Outpatient Rehabilitation Services    Arrangements will be made to provide these services after discharge if needed.  Arrangements include referral to agencies that provide these services.  Your insurance has been verified to be:   [facility]  Your primary doctor is:  [employer]  Pertinent information will be shared with  your doctor and your insurance company.  Inpatient Rehabilitation Care Coordinator:  Lavera Guise, [place] [phone] or [phone]  Information discussed with and copy given to patient by: Andria Rhein, 07/12/2023, 10:35 AM

## 2023-07-12 NOTE — Progress Notes (Signed)
PROGRESS NOTE   Subjective/Complaints: No new complaints this morning Working with therapy Reviewed labs and are stable Appreciate nurse coordinator education  ROS: +new urinary incontinence   Objective:   No results found. Recent Labs    07/10/23 0522 07/12/23 0742  WBC 6.6 8.0  HGB 16.1 17.6*  HCT 47.1 50.7  PLT 154 141*   Recent Labs    07/10/23 0522 07/12/23 0742  NA 139 138  K 3.7 3.9  CL 104 103  CO2 22 19*  GLUCOSE 98 240*  BUN 31* 24*  CREATININE 1.17 1.05  CALCIUM 8.8* 9.1    Intake/Output Summary (Last 24 hours) at 07/12/2023 1233 Last data filed at 07/12/2023 0435 Gross per 24 hour  Intake --  Output 500 ml  Net -500 ml        Physical Exam: Vital Signs Blood pressure (!) 184/93, pulse 73, temperature 98.6 F (37 C), temperature source Oral, resp. rate 18, height 6' (1.829 m), weight 87.2 kg, SpO2 98%. Gen: no distress, normal appearing HEENT: oral mucosa pink and moist, NCAT Cardiovascular:     Rate and Rhythm: Rhythm irregular.  Pulm: breathing comfortably on room air Abdomen: NT, ND Skin:    General: Skin is warm and dry.     Findings: Bruising present.  Neurological:     Mental Status: He is alert.     Comments: Oriented to self and DOB. Mild right facial droop. He was unable to recall age, details leading to admission, unable to recall month, year, season or hospital. Expressive deficits--was unable to describe being in the hospital. He was able to follow simple motor commands.   Psych: pleasant   Assessment/Plan: 1. Functional deficits which require 3+ hours per day of interdisciplinary therapy in a comprehensive inpatient rehab setting. Physiatrist is providing close team supervision and 24 hour management of active medical problems listed below. Physiatrist and rehab team continue to assess barriers to discharge/monitor patient progress toward functional and medical  goals  Care Tool:  Bathing              Bathing assist       Upper Body Dressing/Undressing Upper body dressing        Upper body assist      Lower Body Dressing/Undressing Lower body dressing            Lower body assist       Toileting Toileting    Toileting assist       Transfers Chair/bed transfer  Transfers assist           Locomotion Ambulation   Ambulation assist              Walk 10 feet activity   Assist           Walk 50 feet activity   Assist           Walk 150 feet activity   Assist           Walk 10 feet on uneven surface  activity   Assist           Wheelchair     Assist  Wheelchair 50 feet with 2 turns activity    Assist            Wheelchair 150 feet activity     Assist          Blood pressure (!) 184/93, pulse 73, temperature 98.6 F (37 C), temperature source Oral, resp. rate 18, height 6' (1.829 m), weight 87.2 kg, SpO2 98%.    Medical Problem List and Plan: 1. 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  Antithrombotics: -DVT/anticoagulation:  Pharmaceutical: Other (comment)--Pradaxa             -antiplatelet therapy: N/A 3. Pain Management: Intolereant of tylenol.  4. Mood/Behavior/Sleep: LCSW to follow for evaluation and support.              -antipsychotic agents: N/A             --will add melatonin prn for occasional insomnia 5. Neuropsych/cognition: This patient is not  capable of making decisions on his own behalf. 6. Skin/Wound Care: Routine pressure relief measures.  7. Fluids/Electrolytes/Nutrition:  Monitor I/O. Check CMET in am 8. A fib: Monitor HR TID--continue metoprolol and Pradaxa 9. T2DM w/neuropathy: Hgb A1c-7.1. Will start monitoring  BS ac/hs.  --Use SSI for elevated BS. Monitor intake              --Was on jardiance, Amaryl and metformin PTA  -restart metformin 500mg  daily   10. Pre-renal azotemia: BUN trending up 26-->31. Encourage fluid intake. Placed nursing order to encourage 6-8 glasses of water per day   11. Chronic LBP/neck as well as hip, knee, shoulder: Voltaren gel added to knees and right shoulder             --monitor for symptoms with increase in activity.             Kpad ordered  Will discuss tens unit   12. Suboptimal vitamin D: ergocalciferol 50,000U once per week started  13. HTN: add on magnesium level today  14. Bilateral knee pain: neoprene knee sleeves ordered  LOS: 1 days A FACE TO FACE EVALUATION WAS PERFORMED  Drema Pry Gerell Fortson 07/12/2023, 12:33 PM

## 2023-07-12 NOTE — Progress Notes (Signed)
Orthopedic Tech Progress Note Patient Details:  Cory Brown 1951-06-24 [id number]  Ortho Devices Type of Ortho Device: Knee Sleeve Ortho Device/Splint Location: Bilateral knee Ortho Device/Splint Interventions: Ordered    Left at bedside for PT use.  Tonye Pearson 07/12/2023, 4:34 PM

## 2023-07-12 NOTE — Evaluation (Signed)
Physical Therapy Assessment and Plan  Patient Details  Name: Cory Brown MRN: [id number] Date of Birth: April 06, 1951  PT Diagnosis: Abnormal posture, Abnormality of gait, Cognitive deficits, Difficulty walking, Impaired cognition, Impaired sensation, and Muscle weakness Rehab Potential: Good ELOS: 2-3 weeks   Today's Date: 07/12/2023 PT Individual Time: 1300-1410 PT Individual Time Calculation (min): 70 min    Hospital Problem: Principal Problem:   Acute ischemic left internal carotid artery (ICA) stroke [facility])   Past Medical History:  Past Medical History:  Diagnosis Date   Acute bronchitis, unspecified 01/16/2022   Acute ischemic stroke (HCC) 12/05/2021   Arthritis    Atrial fibrillation (HCC)    Carpal tunnel syndrome 01/16/2022   Cerebral infarction, unspecified (HCC) 01/16/2022   Cerebrovascular accident [facility]) 01/16/2022   Dec 27, 2021 Entered By: [facility] D Comment: Left MCA stroke s/p thrombectomy, 12/05/2021.   Chronic atrial fibrillation (HCC) 09/17/2021   Coronary artery disease 01/16/2022   Dec 27, 2021 Entered By: [facility] D Comment: S/p stent Obtuse marginal 2, 10/2013.   Counseling, unspecified 01/16/2022   Diabetes mellitus without complication (HCC)    Essential (primary) hypertension 01/16/2022   Generalized weakness 09/17/2021   Hypercholesterolemia    Hyperglycemia due to diabetes mellitus (HCC) 09/17/2021   Hyperlipidemia 01/16/2022   Hypertension    Hyponatremia 09/17/2021   Kidney stones    Low back pain 01/16/2022   MI (myocardial infarction) [facility])    Muscle weakness (generalized) 01/16/2022   Neck pain 01/16/2022   Non-traumatic rhabdomyolysis 09/19/2021   Special screening for malignant neoplasms, colon 11/30/2009   May 05, 2010 Entered By: [facility] D Comment: Normal per pt   Stroke (cerebrum) (HCC) 12/05/2021   Stroke (HCC) 2010   Thrombocytopenia (HCC) 09/18/2021   Traumatic rhabdomyolysis (HCC) 09/17/2021   Tremor    Ulnar neuropathy 01/16/2022    Past Surgical History:  Past Surgical History:  Procedure Laterality Date   cardiac stents  2005   CHOLECYSTECTOMY  2007   IR CT HEAD LTD  12/05/2021   IR CT HEAD LTD  07/08/2023   IR PERCUTANEOUS ART THROMBECTOMY/INFUSION INTRACRANIAL INC DIAG ANGIO  12/05/2021   IR PERCUTANEOUS ART THROMBECTOMY/INFUSION INTRACRANIAL INC DIAG ANGIO  07/08/2023   IR US GUIDE VASC ACCESS RIGHT  07/08/2023   KIDNEY STONE SURGERY  1997-2003   RADIOLOGY WITH ANESTHESIA N/A 12/05/2021   Procedure: RADIOLOGY WITH ANESTHESIA;  Surgeon: Radiologist, Medication, MD;  Location: [facility] OR;  Service: Radiology;  Laterality: N/A;   RADIOLOGY WITH ANESTHESIA N/A 07/08/2023   Procedure: IR WITH ANESTHESIA;  Surgeon: Radiologist, Medication, MD;  Location: [facility] OR;  Service: Radiology;  Laterality: N/A;    Assessment & Plan Clinical Impression: Patient is a 72 y.o. 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 He was found to have right sided weakness, right facial droop and slurred speech. UDS negative.  CTA head/neck with perfusion showed left ICA occluded at origin with thrombus except tending to level of ICA terminus, moderate narrowing mid P2 segment of right PCA and perfusion abnormality in the region of the left basal ganglia.  He underwent cerebral angio with mechanical thrombectomy of left ICA with complete recanalization by Dr. Lennox Laity to [name].  Follow-up MRI/MRI brain revealed scattered foci of acute infarct throughout the left MCA and bilateral ACA territories, no acute hemorrhage and no LVO. Chronic  right frontal, left MCA and left basal ganglia infarcts and chronic left thalamic hemorrhage. 2D echo showed EF 60-65 % with mild concentric LVH, severe dilatation of b-atria and mild to moderate TVR. Dr. Pearlean Brownie felt that stroke was due to large vessel disease and  recommended continuing Pradaxa as unknown compliance with medication. Patient with multiple strokes and hx of non compliance.     Right knee x-rays done due to pain revealing mild to moderate medial compartment OA and small joint effusion with calcification proximal medial collateral ligament likely due to remote injury.  Right shoulder x-ray showed moderate glenohumeral and acromioclavicular osteoarthritis no change compared to 03/24. 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 Therapy has been working with patient who is limited by right sided weakness and cognitive deficits. CIR recommended due to functional decline. Reports blurred vision.    Patient currently requires min with mobility secondary to muscle weakness, decreased cardiorespiratoy endurance, impaired timing and sequencing, unbalanced muscle activation, decreased coordination, and decreased motor planning, decreased initiation, decreased attention, decreased awareness, decreased problem solving, decreased safety awareness, decreased memory, and delayed processing, and decreased sitting balance, decreased standing balance, decreased postural control, hemiplegia, and decreased balance strategies.  Prior to hospitalization, patient was modified independent  with mobility and lived with Alone in a House home.  Home access is 6Stairs to enter.  Patient will benefit from skilled PT intervention to maximize safe functional mobility, minimize fall risk, and decrease caregiver burden for planned discharge home with 24 hour supervision.  Anticipate patient will benefit from follow up HH at discharge.  PT - End of Session Activity Tolerance: Tolerates 30+ min activity with multiple rests Endurance Deficit: Yes PT Assessment Rehab Potential (ACUTE/IP ONLY): Good PT Barriers to Discharge: Decreased caregiver support;Home environment access/layout;Incontinence;Lack of/limited family support;Behavior PT Patient demonstrates  impairments in the following area(s): Balance;Behavior;Endurance;Motor;Safety;Sensory PT Transfers Functional Problem(s): Bed Mobility;Bed to Chair;Car PT Locomotion Functional Problem(s): Wheelchair Mobility;Stairs;Ambulation PT Plan PT Intensity: Minimum of 1-2 x/day ,45 to 90 minutes PT Frequency: 5 out of 7 days PT Duration Estimated Length of Stay: 2-3 weeks PT Treatment/Interventions: Ambulation/gait training;Discharge planning;Functional mobility training;Psychosocial support;Therapeutic Activities;Visual/perceptual remediation/compensation;Balance/vestibular training;Disease management/prevention;Neuromuscular re-education;Skin care/wound management;Therapeutic Exercise;Wheelchair propulsion/positioning;Cognitive remediation/compensation;DME/adaptive equipment instruction;Pain management;Splinting/orthotics;UE/LE Strength taining/ROM;Community reintegration;Functional electrical stimulation;Patient/family education;Stair training;UE/LE Coordination activities PT Transfers Anticipated Outcome(s): supervision PT Locomotion Anticipated Outcome(s): supervision PT Recommendation Recommendations for Other Services: Speech consult Follow Up Recommendations: Home health PT Patient destination: Home Equipment Details: TBD   PT Evaluation Precautions/Restrictions Precautions Precautions: Fall Precaution Comments: aphaisa, delay in processing, R hemi Restrictions Weight Bearing Restrictions: No General   Vital SignsTherapy Vitals Temp: 98.6 F (37 C) Temp Source: Oral Pulse Rate: 71 Resp: 14 BP: 137/72 Patient Position (if appropriate): Lying Oxygen Therapy SpO2: 97 % O2 Device: Room Air Pain Pain Assessment Pain Scale: Faces Faces Pain Scale: Hurts a little bit Pain Type: Chronic pain Pain Location: Hand Pain Orientation: Right Pain Descriptors / Indicators: Aching Patients Stated Pain Goal: 0 Pain Intervention(s): Pain med given for lower pain score than stated, per  patient request;Hot/Cold interventions Multiple Pain Sites: No Pain Interference Pain Interference Pain Effect on Sleep: 2. Occasionally Pain Interference with Therapy Activities: 2. Occasionally Pain Interference with Day-to-Day Activities: 2. Occasionally Home Living/Prior Functioning Home Living Living Arrangements: Alone Available Help at Discharge: Family;Available 24 hours/day Type of Home: House Home Access: Stairs to enter [employer] of Steps: 6 Home Layout: One level Bathroom Shower/Tub: Engineer, manufacturing systems: Standard Bathroom Accessibility: Yes Additional Comments: son not  present, information taken from acute note and pt, will verify with family when present  Lives With: Alone Prior Function Level of Independence: Independent with basic ADLs;Independent with transfers;Requires assistive device for independence;Independent with homemaking with ambulation  Able to Take Stairs?: Yes Driving: No Vocation: Retired Optometrist - History Ability to See in Adequate Light: 0 Adequate Vision - Assessment Additional Comments: R visual field scanning delays noted Perception Perception: Impaired Preception Impairment Details: Body Part identification;Inattention/Neglect Perception-Other Comments: R sided inattention/neglect Praxis Praxis: Impaired Praxis Impairment Details: Motor planning;Organization;Initiation  Cognition Overall Cognitive Status: Impaired/Different from baseline Arousal/Alertness: Awake/alert Orientation Level: Oriented to person;Disoriented to situation;Disoriented to person;Disoriented to place;Disoriented to time Year:  (1982) Month:  (monday) Day of Week: Incorrect Attention: Sustained;Selective Sustained Attention: Impaired Sustained Attention Impairment: Functional basic;Verbal basic Selective Attention: Impaired Selective Attention Impairment: Functional basic;Verbal basic Memory: Impaired Memory  Impairment: Storage deficit;Retrieval deficit;Decreased recall of new information;Decreased short term memory Decreased Short Term Memory: Verbal basic Awareness: Impaired Awareness Impairment: Intellectual impairment;Emergent impairment;Anticipatory impairment Problem Solving: Impaired Problem Solving Impairment: Functional basic;Verbal basic Executive Function: Self Monitoring;Self Correcting Self Monitoring: Impaired Self Monitoring Impairment: Verbal basic;Functional basic Self Correcting: Impaired Self Correcting Impairment: Verbal basic;Functional basic Safety/Judgment: Impaired Sensation Sensation Light Touch: Impaired by gross assessment Hot/Cold: Impaired Detail Hot/Cold Impaired Details: Impaired RUE Proprioception: Impaired by gross assessment Stereognosis: Impaired by gross assessment Additional Comments: light touch appears in tact, pt denies any numbness or tingling, pt reports decreased ability to detect on R LE Coordination Gross Motor Movements are Fluid and Coordinated: No Fine Motor Movements are Fluid and Coordinated: No Coordination and Movement Description: limited by R LE weakness, R hemi Finger Nose Finger Test: delayed R hand 9 Hole Peg Test: 9 HPT 1 min L hand, 1 min 43 sec R hand Motor  Motor Motor: Hemiplegia Motor - Skilled Clinical Observations: R hemi   Trunk/Postural Assessment  Cervical Assessment Cervical Assessment: Exceptions to [facility] (forward head) Thoracic Assessment Thoracic Assessment: Exceptions to [facility] (kyphosis) Lumbar Assessment Lumbar Assessment: Exceptions to [facility] (posterior pelvic tilt) Postural Control Postural Control: Deficits on evaluation Righting Reactions: delayed Protective Responses: delayed  Balance Balance Balance Assessed: Yes Static Sitting Balance Static Sitting - Balance Support: Bilateral upper extremity supported Static Sitting - Level of Assistance: 5: Stand by assistance (CGA) Dynamic Sitting  Balance Dynamic Sitting - Level of Assistance: 5: Stand by assistance (CGA) Static Standing Balance Static Standing - Level of Assistance: 5: Stand by assistance (CGA) Dynamic Standing Balance Dynamic Standing - Level of Assistance: 4: Min assist Extremity Assessment  RUE Assessment RUE Assessment: Exceptions to [facility] General Strength Comments: R grip 35 lbs with gross strength overall 3-/5 LUE Assessment LUE Assessment: Within Functional Limits General Strength Comments: L grip 55 lbs RLE Assessment RLE Assessment: Exceptions to [facility] General Strength Comments: formal assessment difficult to assess 2/2 pt difficulty following commands but grossly 3+/5 LLE Assessment LLE Assessment: Exceptions to [facility] General Strength Comments: formal assessment difficult to assess 2/2 pt difficulty following commands but grossly 3+/5  Care Tool Care Tool Bed Mobility Roll left and right activity   Roll left and right assist level: Minimal Assistance - Patient > 75%    Sit to lying activity   Sit to lying assist level: Moderate Assistance - Patient 50 - 74%    Lying to sitting on side of bed activity   Lying to sitting on side of bed assist level: the ability to move from lying on the back to sitting on the side  of the bed with no back support.: Minimal Assistance - Patient > 75%     Care Tool Transfers Sit to stand transfer   Sit to stand assist level: Minimal Assistance - Patient > 75%    Chair/bed transfer   Chair/bed transfer assist level: Minimal Assistance - Patient > 75%     Psychologist, clinical transfer assist level: Moderate Assistance - Patient 50 - 74%      Care Tool Locomotion Ambulation   Assist level: Minimal Assistance - Patient > 75% Assistive device: No Device Max distance: 70  Walk 10 feet activity   Assist level: Minimal Assistance - Patient > 75% Assistive device: No Device   Walk 50 feet with 2 turns activity   Assist level: Minimal  Assistance - Patient > 75% Assistive device: Hand held assist  Walk 150 feet activity Walk 150 feet activity did not occur: Safety/medical concerns   Assistive device: Hand held assist  Walk 10 feet on uneven surfaces activity   Assist level: Moderate Assistance - Patient - 50 - 74% Assistive device: Hand held assist  Stairs   Assist level: Minimal Assistance - Patient > 75% Stairs assistive device: 2 hand rails Max number of stairs: 8  Walk up/down 1 step activity   Walk up/down 1 step (curb) assist level: Minimal Assistance - Patient > 75% Walk up/down 1 step or curb assistive device: 2 hand rails  Walk up/down 4 steps activity   Walk up/down 4 steps assist level: Minimal Assistance - Patient > 75% Walk up/down 4 steps assistive device: 2 hand rails  Walk up/down 12 steps activity Walk up/down 12 steps activity did not occur: Safety/medical concerns      Pick up small objects from floor   Pick up small object from the floor assist level: Minimal Assistance - Patient > 75% Pick up small object from the floor assistive device: no AD  Wheelchair Is the patient using a wheelchair?: Yes Type of Wheelchair: Manual     Max wheelchair distance: 50  Wheel 50 feet with 2 turns activity   Assist Level: Moderate Assistance - Patient 50 - 74%  Wheel 150 feet activity   Assist Level: Maximal Assistance - Patient 25 - 49%    Refer to Care Plan for Long Term Goals  SHORT TERM GOAL WEEK 1 PT Short Term Goal 1 (Week 1): Pt will ambulate 150 feet with LRAD and CGA PT Short Term Goal 2 (Week 1): Pt will perform bed mobility with CGA with LRAD PT Short Term Goal 3 (Week 1): pt will perform stand pivot transfer with CGA with LRAD  Recommendations for other services: None   Skilled Therapeutic Intervention Mobility Bed Mobility Bed Mobility: Rolling Right;Rolling Left;Supine to Sit Rolling Right: Minimal Assistance - Patient > 75% Rolling Left: Minimal Assistance - Patient > 75% Supine  to Sit: Minimal Assistance - Patient > 75% Transfers Transfers: Sit to Stand;Stand to Sit;Stand Pivot Transfers Sit to Stand: Minimal Assistance - Patient > 75% Stand to Sit: Minimal Assistance - Patient > 75% Stand Pivot Transfers: Minimal Assistance - Patient > 75% Stand Pivot Transfer Details: Verbal cues for sequencing;Tactile cues for initiation Transfer (Assistive device): None Locomotion  Gait Ambulation: Yes Gait Distance (Feet): 70 Feet Assistive device: None Gait Gait: Yes Gait Pattern: Impaired Gait Pattern: Step-to pattern;Shuffle;Decreased stride length;Decreased step length - right;Decreased step length - left;Lateral trunk lean to right Stairs / Additional Locomotion Stairs: Yes  Stairs Assistance: Minimal Assistance - Patient > 75% Stair Management Technique: Two rails Number of Stairs: 86 Height of Stairs: 6 Ramp: Moderate Assistance - Patient 50 - 74% Wheelchair Mobility Wheelchair Mobility: Yes Wheelchair Assistance: Moderate Assistance - Patient 50 - 74% Wheelchair Propulsion: Both upper extremities Wheelchair Parts Management: Needs assistance Distance: 50   Today's Interventions   Evaluation completed (see details above and below) with education on PT POC and goals and individual treatment initiated with focus on gait and transfer training. Education on PT evaluation, CIR policies, and therapy schedule.   Pt awake and alert and agreeable to therapy. Pt denies any pain. Pt oriented to self but disoriented to place, situation, time. No family present to verify PLOF, home set up. Pt able to follow functional commands, but requires cues for sequencing and safety.   Bed mobility min A with HOB flat and no bed rails, verbal and tactile cues provided for technique  Sit to stand and stand pivot transfer with no AD and min A with R HHA, vebral and tactile cues provided for sequencing and attention to R UE/LE. No evidence of buckling.   Stand pivot transfer to car  transfer with min-mod A, 2/2 L lateral trunk lean with lifting legs into car, verbal and tactile cues provided for technique.   Gait with no AD and R HHA x77 feet with shuffle gait with verbal and tactile cues provided for increased step length, and attention to R UE/LE.   Stair navigation x8 6 inch steps with B HR and CGA/min A, with step to gait verbal and tactile cues provided for technique.   Pt seated in WC at end of session with all needs within reach.      Discharge Criteria: Patient will be discharged from PT if patient refuses treatment 3 consecutive times without medical reason, if treatment goals not met, if there is a change in medical status, if patient makes no progress towards goals or if patient is discharged from hospital.  The above assessment, treatment plan, treatment alternatives and goals were discussed and mutually agreed upon: by patient  Geoffry Paradise, PT, DPT  07/12/2023, 4:55 PM

## 2023-07-13 ENCOUNTER — Encounter (HOSPITAL_COMMUNITY): Payer: Self-pay | Admitting: *Deleted

## 2023-07-13 DIAGNOSIS — I63232 Cerebral infarction due to unspecified occlusion or stenosis of left carotid arteries: Secondary | ICD-10-CM | POA: Diagnosis not present

## 2023-07-13 LAB — GLUCOSE, CAPILLARY
Glucose-Capillary: 130 mg/dL — ABNORMAL HIGH (ref 70–99)
Glucose-Capillary: 159 mg/dL — ABNORMAL HIGH (ref 70–99)
Glucose-Capillary: 199 mg/dL — ABNORMAL HIGH (ref 70–99)
Glucose-Capillary: 189 mg/dL — ABNORMAL HIGH (ref 70–99)

## 2023-07-13 NOTE — Progress Notes (Signed)
Occupational Therapy Session Note  Patient Details  Name: Cory Brown MRN: [id number] Date of Birth: 1950-12-25  Today's Date: 07/13/2023 OT Individual Time: 1020-1115 OT Individual Time Calculation (min): 55 min    Short Term Goals: Week 1:  OT Short Term Goal 1 (Week 1): Pt will complete self feeding with dist s including r hand integration and container opening OT Short Term Goal 2 (Week 1): Pt will stand [place] for simple grooming 2 min with CGA and min cues for sequencing OT Short Term Goal 3 (Week 1): Pt will complete toilet and TTB transfer with min a and min cues with RW support  Skilled Therapeutic Interventions/Progress Updates:  Skilled OT intervention completed with focus on ADL retraining, functional endurance, and mobility within a shower context. Pt received seated in w/c, flat, stating "I try to get better but I just can't seem to be on my way." Generalized pain reported in Rt knee, back and B hips; un-medicated per report but politely declined. OT offered rest breaks, repositioning and moist heat via shower for pain relief.  Did not appear aphasic. He expressed feeling down, and was agreeable to shower for pain relief and to boost mood. Pt completed all sit > stands with CGA/min A and stand pivot transfers with grab bar and overall CGA/min A. Min cues needed throughout for sequencing, correcting Rt lean in stance, fall prevention and body positioning.  Transported dependently in w/c > shower. Pt was able to bathe all parts with CGA, at the sit > stand level while using grab bar for balance. Pt slightly perseverative on certain parts but anticipate he was being thorough. Stand pivot transfer using grab bar > w/c. Able to donn shirt/deo with supervision. Threaded LB clothing with min A after repeat cues needed for hemi-technique, at the Min A sit > stand level with grab bar. Donned socks/shoes with total A for time.  Able to complete oral care and hair grooming with  supervision. Pt remained seated in w/c, with belt alarm on/activated, and with all needs in reach at end of session.    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

## 2023-07-13 NOTE — Progress Notes (Signed)
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  Ortho Devices Type of Ortho Device: Knee Sleeve Ortho Device/Splint Location: Bi LE Ortho Device/Splint Interventions: Ordered      Sharion Grieves E Tomoya Ringwald 07/13/2023, 9:29 AM

## 2023-07-13 NOTE — Progress Notes (Signed)
Physical Therapy Session Note  Patient Details  Name: Cory Brown MRN: [id number] Date of Birth: 1951/01/22  Today's Date: 07/13/2023 PT Individual Time: 1450-1520 PT Individual Time Calculation (min): 30 min   Short Term Goals: Week 1:  PT Short Term Goal 1 (Week 1): Pt will ambulate 150 feet with LRAD and CGA PT Short Term Goal 2 (Week 1): Pt will perform bed mobility with CGA with LRAD PT Short Term Goal 3 (Week 1): pt will perform stand pivot transfer with CGA with LRAD  Skilled Therapeutic Interventions/Progress Updates: Pt presented in bed sleeping but easily aroused and agreeable to therapy with encouragement. Pt c/o pain 8/10 in B knees, shoulder, and elbow. Nsg notified but only able to provide voltaren gel at this time which was applied during session. Pt completed bed mobility with minA, increased time and use of bed features. Pt required multimodal cues to to scoot anteriorly to EOB with mild retropulsion requiring minA for truncal support to avoid falling backwards. Pt completed stand pivot transfer without AD and minA to w/c for improved supported sitting. Pt noted to have new sized soft knee braces in room. Pt transported to day room for energy conservation and donned total A. Pt then agreeable to ambulate in day room. Pt ambulated 28ft with SPC and minA. Pt noted to have shortened step length, narrow BOS, decreased R foot clearance. Pt required mod multimodal cues for sequencing with SPC and increasing step length which pt was able to complete for approx 4-5 steps then reverted back to previous form.  Pt then indicated too fatigued to participate any further and requesting return to bed. Pt transported back to room and completed ambulatory transfer to bed with HHA (minA). Pt completed sit to supine with minA primarily for RLE management. By the time bed alarm was on pt noted to be sleeping. Pt left with bed alarm on, call bell within reach and pt in NAD.      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

## 2023-07-13 NOTE — Progress Notes (Signed)
Physical Therapy Session Note  Patient Details  Name: Cory Brown MRN: [id number] Date of Birth: [birth year]  Today's Date: 07/13/2023 PT Individual Time: 4401-0272 PT Individual Time Calculation (min): 41 min   Short Term Goals: Week 1:  PT Short Term Goal 1 (Week 1): Pt will ambulate 150 feet with LRAD and CGA PT Short Term Goal 2 (Week 1): Pt will perform bed mobility with CGA with LRAD PT Short Term Goal 3 (Week 1): pt will perform stand pivot transfer with CGA with LRAD  Skilled Therapeutic Interventions/Progress Updates:   Received pt semi-reclined in bed, pt agreeable to PT treatment, and reported pain 5/10 in low back, shoulders, and knees but declined any pain medication. Session with emphasis on functional mobility/transfers, toileting, generalized strengthening and endurance, and gait training. Pt transferred semi-reclined<>sitting L EOB with HOB elevated and use of bedrails with supervision and increased time/effort due to pain. Pt reported urge to void and stood from low sitting EOB with min HHA and ambulated in/out of bathroom with min HHA - noted tremors in R hand (pt reports these are baseline). Pt able to manage clothing with CGA and able to void. Removed soiled brief and donned clean one with max A. Stood from commode with grab bar and CGA, ambulated to sink with R HHA, and stood to perform hand hygiene with CGA. Donned XL R knee sleeve but too big - requested size L from MD via secure chat. Pt transported to/from room in [facility] dependently for time management purposes. Pt stood without AD and CGA and ambulated 116ft without AD and min A. Pt ambulates with narrow BOS, shuffling steps, and downward gaze but improved with cues to increase R step length. Located [facility] for pt to trial with PTA this afternoon. Returned to room and concluded session with pt sitting in [facility], needs within reach, and seatbelt alarm on awaiting SLP session.   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

## 2023-07-13 NOTE — Progress Notes (Signed)
PROGRESS NOTE   Subjective/Complaints: No new complaints this morning L knee sleeves ordered- patient finds these helpful Vitals stable  ROS: +new urinary incontinence, +bilateral knee pain   Objective:   No results found. Recent Labs    07/12/23 0742  WBC 8.0  HGB 17.6*  HCT 50.7  PLT 141*   Recent Labs    07/12/23 0742  NA 138  K 3.9  CL 103  CO2 19*  GLUCOSE 240*  BUN 24*  CREATININE 1.05  CALCIUM 9.1    Intake/Output Summary (Last 24 hours) at 07/13/2023 1920 Last data filed at 07/13/2023 1852 Gross per 24 hour  Intake 788 ml  Output --  Net 788 ml        Physical Exam: Vital Signs Blood pressure 134/60, pulse 70, temperature 98.4 F (36.9 C), resp. rate 16, height 6' (1.829 m), weight 86.9 kg, SpO2 95%. Gen: no distress, normal appearing HEENT: oral mucosa pink and moist, NCAT Cardiovascular:     Rate and Rhythm: Rhythm irregular.  Pulm: breathing comfortably on room air Abdomen: NT, ND Skin:    General: Skin is warm and dry.     Findings: Bruising present.  Neurological:     Mental Status: He is alert.     Comments: Oriented to self and DOB. Mild right facial droop. He was unable to recall age, details leading to admission, unable to recall month, year, season or hospital. Expressive deficits--was unable to describe being in the hospital. He was able to follow simple motor commands.   Psych: pleasant GU: urinary incontinence   Assessment/Plan: 1. Functional deficits which require 3+ hours per day of interdisciplinary therapy in a comprehensive inpatient rehab setting. Physiatrist is providing close team supervision and 24 hour management of active medical problems listed below. Physiatrist and rehab team continue to assess barriers to discharge/monitor patient progress toward functional and medical goals  Care Tool:  Bathing    Body parts bathed by patient: Right arm, Left arm,  Chest, Abdomen, Front perineal area, Right upper leg, Left upper leg, Face, Buttocks, Right lower leg, Left lower leg   Body parts bathed by helper: Buttocks, Left lower leg, Right lower leg     Bathing assist Assist Level: Contact Guard/Touching assist     Upper Body Dressing/Undressing Upper body dressing   What is the patient wearing?: Pull over shirt    Upper body assist Assist Level: Supervision/Verbal cueing    Lower Body Dressing/Undressing Lower body dressing      What is the patient wearing?: Underwear/pull up, Pants     Lower body assist Assist for lower body dressing: Moderate Assistance - Patient 50 - 74%     Toileting Toileting    Toileting assist Assist for toileting: Moderate Assistance - Patient 50 - 74%     Transfers Chair/bed transfer  Transfers assist     Chair/bed transfer assist level: Minimal Assistance - Patient > 75%     Locomotion Ambulation   Ambulation assist      Assist level: Minimal Assistance - Patient > 75% Assistive device: No Device Max distance: 151ft   Walk 10 feet activity   Assist     Assist  level: Minimal Assistance - Patient > 75% Assistive device: No Device   Walk 50 feet activity   Assist    Assist level: Minimal Assistance - Patient > 75% Assistive device: No Device    Walk 150 feet activity   Assist Walk 150 feet activity did not occur: Safety/medical concerns    Assistive device: Hand held assist    Walk 10 feet on uneven surface  activity   Assist     Assist level: Moderate Assistance - Patient - 50 - 74% Assistive device: Hand held assist   Wheelchair     Assist Is the patient using a wheelchair?: Yes Type of Wheelchair: Manual      Max wheelchair distance: 50    Wheelchair 50 feet with 2 turns activity    Assist        Assist Level: Moderate Assistance - Patient 50 - 74%   Wheelchair 150 feet activity     Assist      Assist Level: Maximal Assistance  - Patient 25 - 49%   Blood pressure 134/60, pulse 70, temperature 98.4 F (36.9 C), resp. rate 16, height 6' (1.829 m), weight 86.9 kg, SpO2 95%.    Medical Problem List and Plan: 1. 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  Antithrombotics: -DVT/anticoagulation:  Pharmaceutical: Other (comment)--Pradaxa             -antiplatelet therapy: N/A 3. Pain Management: Intolereant of tylenol.  4. Mood/Behavior/Sleep: LCSW to follow for evaluation and support.              -antipsychotic agents: N/A             --will add melatonin prn for occasional insomnia 5. Neuropsych/cognition: This patient is not  capable of making decisions on his own behalf. 6. Skin/Wound Care: Routine pressure relief measures.  7. Fluids/Electrolytes/Nutrition:  Monitor I/O. Check CMET in am 8. A fib: Monitor HR TID--continue metoprolol and Pradaxa 9. T2DM w/neuropathy: Hgb A1c-7.1. Will start monitoring  BS ac/hs.  --Use SSI for elevated BS. Monitor intake             --Was on jardiance, Amaryl and metformin PTA  -restart metformin 500mg  daily   10. Pre-renal azotemia: BUN trending up 26-->31. Encourage fluid intake. Placed nursing order to encourage 6-8 glasses of water per day   11. Chronic LBP/neck as well as hip, knee, shoulder: continue Voltaren gel added knees and right shoulder             --monitor for symptoms with increase in activity.             Kpad ordered  Will discuss tens unit   12. Suboptimal vitamin D: ergocalciferol 50,000U once per week started, continue  13. HTN: magnesium reviewed and is stable at 2  14. Bilateral knee pain: neoprene knee sleeves ordered, discussed with patient and he finds these helpful  15. Urinary continence: bladder program ordered to take patient to bathroom q2H while awake  LOS: 2 days A FACE TO FACE EVALUATION WAS  PERFORMED  Drema Pry Taydem Cavagnaro 07/13/2023, 7:20 PM

## 2023-07-13 NOTE — Progress Notes (Signed)
Speech Language Pathology Daily Session Note  Patient Details  Name: Cory Brown MRN: [id number] Date of Birth: 08-01-1951  Today's Date: 07/13/2023 SLP Individual Time: 0902-1000 SLP Individual Time Calculation (min): 58 min  Short Term Goals: Week 1: SLP Short Term Goal 1 (Week 1): Patient will demonstrate orientation to person, place, time, and situation with minA and use of external aids SLP Short Term Goal 2 (Week 1): Patient will follow two-step functional directions with 80% accuracy given minA SLP Short Term Goal 3 (Week 1): Patient will communicate mildly complex thoughts and ideas in 4/5 opportunities utilizing provided expressive language strategies with modA SLP Short Term Goal 4 (Week 1): Patient will recall basic daily events utilizing provided memory strategies as needed given modA SLP Short Term Goal 5 (Week 1): Patient will solve basic environmental problems in 4/5 opportunities given modA  Skilled Therapeutic Interventions: SLP conducted skilled therapy session targeting communication and cognitive goals. SLP and patient discussed events from previous day with patient seemingly recognizing faces but not names or prior basic events. Patient disoriented to place, time, and situation requiring mod-maxA to reorient. Patient exhibits emerging intellectual awareness stating that his "mind isn't good today", although benefits from maxA to describe details of cognitive deficits. SLP provided patient with list of various therapies and goals for each to assist with awareness of situation and recall of purpose for admission. During visit, patient reported that he would "do a lot better" if he could just "go home", demonstrating impaired safety/judgement. SLP and patient discussed unsafe nature of that idea with patient benefiting from reorientation to location and max cues to recall reason for admission. During communication tasks, patient provided vocabulary terms in 17/20 opportunities when  given basic description of item. Patient required mod-maxA to answer remaining prompts. During communication of mildly complex thoughts, patient required modA to adequately answer questions. Patient was left in chair with call bell in reach and chair alarm set. SLP will continue to target goals per plan of care.       Pain Pain Assessment Pain Scale: 0-10 Pain Score: 5  Pain Location: Knee  Therapy/Group: Individual Therapy  [name], M.A., CCC-SLP   [name] 07/13/2023, 9:22 AM

## 2023-07-14 ENCOUNTER — Encounter (HOSPITAL_COMMUNITY): Payer: Self-pay | Admitting: Physical Medicine and Rehabilitation

## 2023-07-14 DIAGNOSIS — I63232 Cerebral infarction due to unspecified occlusion or stenosis of left carotid arteries: Secondary | ICD-10-CM | POA: Diagnosis not present

## 2023-07-14 LAB — GLUCOSE, CAPILLARY
Glucose-Capillary: 154 mg/dL — ABNORMAL HIGH (ref 70–99)
Glucose-Capillary: 185 mg/dL — ABNORMAL HIGH (ref 70–99)
Glucose-Capillary: 198 mg/dL — ABNORMAL HIGH (ref 70–99)
Glucose-Capillary: 190 mg/dL — ABNORMAL HIGH (ref 70–99)

## 2023-07-14 MED ORDER — MAGNESIUM GLUCONATE 500 MG PO TABS
250.0000 mg | ORAL_TABLET | Freq: Every day | ORAL | Status: DC
  Administered 2023-07-14 – 2023-07-24 (×11): 250 mg via ORAL
  Filled 2023-07-14 (×12): qty 1

## 2023-07-14 MED ORDER — NAPHAZOLINE-GLYCERIN 0.012-0.25 % OP SOLN
1.0000 [drp] | Freq: Four times a day (QID) | OPHTHALMIC | Status: DC | PRN
  Administered 2023-07-14: 2 [drp] via OPHTHALMIC
  Administered 2023-07-14 – 2023-07-15 (×2): 1 [drp] via OPHTHALMIC
  Administered 2023-07-15 – 2023-07-19 (×2): 2 [drp] via OPHTHALMIC
  Filled 2023-07-14: qty 15

## 2023-07-14 MED ORDER — METFORMIN HCL 500 MG PO TABS
500.0000 mg | ORAL_TABLET | Freq: Two times a day (BID) | ORAL | Status: DC
  Administered 2023-07-14 – 2023-07-18 (×8): 500 mg via ORAL
  Filled 2023-07-14 (×8): qty 1

## 2023-07-14 NOTE — Progress Notes (Signed)
Physical Therapy Session Note  Patient Details  Name: LAQUINTA WIDER MRN: [id number] Date of Birth: 1951/10/25  Today's Date: 07/14/2023 PT Individual Time: 9528-4132 PT Individual Time Calculation (min): 58 min   Short Term Goals: Week 1:  PT Short Term Goal 1 (Week 1): Pt will ambulate 150 feet with LRAD and CGA PT Short Term Goal 2 (Week 1): Pt will perform bed mobility with CGA with LRAD PT Short Term Goal 3 (Week 1): pt will perform stand pivot transfer with CGA with LRAD  Skilled Therapeutic Interventions/Progress Updates:  Pt performed bed mobility with S and side rails with head of bed elevated and increased time. Pt performed multiple sit to stand and stand pivot transfers with st cane and contact guard assist with verbal cues. Pt ambulated 93 feet x 2 with st cane and contact guard assist with verbal cues. Pt performed cone taps 10 reps with R LE. Pt performed step taps 3 sets x 10 reps each R LE, 2 sets x 10 reps each hip flex with L LE, 1 set x 10 reps L LE actual step taps. Pt then performed 3 sets x 10 reps each alternating step taps B LEs. Pt returned t room. Pt transferred w/c to edge of bed with contact guard assist and st cane. Pt left sitting up in bed with all needs within reach and bed alarm on.   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  Therapy/Group: Individual Therapy  Rayford Halsted 07/14/2023, 12:38 PM

## 2023-07-14 NOTE — Plan of Care (Signed)
  Problem: RH BOWEL ELIMINATION Goal: RH STG MANAGE BOWEL WITH ASSISTANCE Description: STG Manage Bowel with min/mod Assistance. Flowsheets (Taken 07/14/2023 0115) STG: Pt will manage bowels with assistance: 6-Modified independent Goal: RH STG MANAGE BOWEL W/MEDICATION W/ASSISTANCE Description: STG Manage Bowel with Medication with min Assistance. Flowsheets (Taken 07/14/2023 0115) STG: Pt will manage bowels with medication with assistance: 6-Modified independent   Problem: RH BLADDER ELIMINATION Goal: RH STG MANAGE BLADDER WITH ASSISTANCE Description: STG Manage Bladder With min/mod Assistance Flowsheets (Taken 07/14/2023 0115) STG: Pt will manage bladder with assistance: 6-Modified independent   Problem: RH SKIN INTEGRITY Goal: RH STG MAINTAIN SKIN INTEGRITY WITH ASSISTANCE Description: STG Maintain Skin Integrity With min Assistance. Flowsheets (Taken 07/14/2023 0115) STG: Maintain skin integrity with assistance: 5-Supervision/cueing Goal: RH STG ABLE TO PERFORM INCISION/WOUND CARE W/ASSISTANCE Description: STG Able To Perform Incision/Wound Care With min Assistance. Flowsheets (Taken 07/14/2023 0115) STG: Pt will be able to perform incision/wound care with assistance: 6-Modified independent   Problem: RH SAFETY Goal: RH STG ADHERE TO SAFETY PRECAUTIONS W/ASSISTANCE/DEVICE Description: STG Adhere to Safety Precautions With min Assistance/Device. Flowsheets (Taken 07/14/2023 0115) STG:Pt will adhere to safety precautions with assistance/device: 5-Supervision/cueing Goal: RH STG DECREASED RISK OF FALL WITH ASSISTANCE Description: STG Decreased Risk of Fall With min Assistance. Flowsheets (Taken 07/14/2023 0115) [id number] risk of fall  with assistance/device: 5-Supervision/cueing   Problem: RH COGNITION-NURSING Goal: RH STG USES MEMORY AIDS/STRATEGIES W/ASSIST TO PROBLEM SOLVE Description: STG Uses Memory Aids/Strategies With cueing Assistance to Problem Solve. Flowsheets (Taken  07/14/2023 0115) STG: Uses memory aids/strategies with assistance: 5-Supervision/cueing Goal: RH STG ANTICIPATES NEEDS/CALLS FOR ASSIST W/ASSIST/CUES Description: STG Anticipates Needs/Calls for Assist With min Assistance/Cues. Flowsheets (Taken 07/14/2023 0115) STG: Anticipates needs/calls for assistance with assistance/cues: 5-Supervision/cueing   Problem: RH PAIN MANAGEMENT Goal: RH STG PAIN MANAGED AT OR BELOW PT'S PAIN GOAL Description: Less than 4 with PRN medications min assist Note: < 3 on pain scale

## 2023-07-14 NOTE — Progress Notes (Signed)
Speech Language Pathology Daily Session Note  Patient Details  Name: Cory Brown MRN: [id number] Date of Birth: 03-04-1951  Today's Date: 07/14/2023 SLP Individual Time: 1430-1530 SLP Individual Time Calculation (min): 60 min  Short Term Goals: Week 1: SLP Short Term Goal 1 (Week 1): Patient will demonstrate orientation to person, place, time, and situation with minA and use of external aids SLP Short Term Goal 2 (Week 1): Patient will follow two-step functional directions with 80% accuracy given minA SLP Short Term Goal 3 (Week 1): Patient will communicate mildly complex thoughts and ideas in 4/5 opportunities utilizing provided expressive language strategies with modA SLP Short Term Goal 4 (Week 1): Patient will recall basic daily events utilizing provided memory strategies as needed given modA SLP Short Term Goal 5 (Week 1): Patient will solve basic environmental problems in 4/5 opportunities given modA  Skilled Therapeutic Interventions: Skilled therapy session focused on communication and orientation goals. SLP faciliated session by providing modA during orientation tasks. Patient was able to orient to self, however required external cues to orient to time, situation and place. Of note, during phone call with friend after initial orientation, patient able to independently report he had a stroke. Communication goals were addressed by providing supervision A during word finding tasks. Patient able to name functional items and their use with 100% accuracy. Patient with more difficulty naming verbs, however only required supervisionA demonstrating progress from initial evaluation. At the end of the session, SLP provided patient with memory book to aid in daily recall of events. Patient left in bed with call bell in reach and alarm set. Continue POC.   Pain No pain reported  Therapy/Group: Individual Therapy  Whitten Andreoni M.A., CF-SLP 07/14/2023, 3:34 PM

## 2023-07-14 NOTE — Progress Notes (Signed)
PROGRESS NOTE   Subjective/Complaints: Red eyes- clear eyes ordered IPOC completed Patient's chart reviewed- No issues reported overnight BP improved but still elevated   ROS: +new urinary incontinence, +bilateral knee pain, +red eyes   Objective:   No results found. Recent Labs    07/12/23 0742  WBC 8.0  HGB 17.6*  HCT 50.7  PLT 141*   Recent Labs    07/12/23 0742  NA 138  K 3.9  CL 103  CO2 19*  GLUCOSE 240*  BUN 24*  CREATININE 1.05  CALCIUM 9.1    Intake/Output Summary (Last 24 hours) at 07/14/2023 0803 Last data filed at 07/13/2023 2255 Gross per 24 hour  Intake 908 ml  Output 0 ml  Net 908 ml        Physical Exam: Vital Signs Blood pressure (!) 152/88, pulse 70, temperature 98.2 F (36.8 C), resp. rate 16, height 6' (1.829 m), weight 86.9 kg, SpO2 97%. Gen: no distress, normal appearing HEENT: oral mucosa pink and moist, NCA, bilateral eyes red Cardiovascular:     Rate and Rhythm: Rhythm irregular.  Pulm: breathing comfortably on room air Abdomen: NT, ND Skin:    General: Skin is warm and dry.     Findings: Bruising present.  Neurological:     Mental Status: He is alert.     Comments: Oriented to self and DOB. Mild right facial droop. He was unable to recall age, details leading to admission, unable to recall month, year, season or hospital. Expressive deficits--was unable to describe being in the hospital. He was able to follow simple motor commands.   Psych: pleasant GU: urinary incontinence   Assessment/Plan: 1. Functional deficits which require 3+ hours per day of interdisciplinary therapy in a comprehensive inpatient rehab setting. Physiatrist is providing close team supervision and 24 hour management of active medical problems listed below. Physiatrist and rehab team continue to assess barriers to discharge/monitor patient progress toward functional and medical goals  Care  Tool:  Bathing    Body parts bathed by patient: Right arm, Left arm, Chest, Abdomen, Front perineal area, Right upper leg, Left upper leg, Face, Buttocks, Right lower leg, Left lower leg   Body parts bathed by helper: Buttocks, Left lower leg, Right lower leg     Bathing assist Assist Level: Contact Guard/Touching assist     Upper Body Dressing/Undressing Upper body dressing   What is the patient wearing?: Pull over shirt    Upper body assist Assist Level: Supervision/Verbal cueing    Lower Body Dressing/Undressing Lower body dressing      What is the patient wearing?: Underwear/pull up, Pants     Lower body assist Assist for lower body dressing: Moderate Assistance - Patient 50 - 74%     Toileting Toileting    Toileting assist Assist for toileting: Moderate Assistance - Patient 50 - 74%     Transfers Chair/bed transfer  Transfers assist     Chair/bed transfer assist level: Minimal Assistance - Patient > 75%     Locomotion Ambulation   Ambulation assist      Assist level: Minimal Assistance - Patient > 75% Assistive device: No Device Max distance: 161ft  Walk 10 feet activity   Assist     Assist level: Minimal Assistance - Patient > 75% Assistive device: No Device   Walk 50 feet activity   Assist    Assist level: Minimal Assistance - Patient > 75% Assistive device: No Device    Walk 150 feet activity   Assist Walk 150 feet activity did not occur: Safety/medical concerns    Assistive device: Hand held assist    Walk 10 feet on uneven surface  activity   Assist     Assist level: Moderate Assistance - Patient - 50 - 74% Assistive device: Hand held assist   Wheelchair     Assist Is the patient using a wheelchair?: Yes Type of Wheelchair: Manual      Max wheelchair distance: 50    Wheelchair 50 feet with 2 turns activity    Assist        Assist Level: Moderate Assistance - Patient 50 - 74%   Wheelchair  150 feet activity     Assist      Assist Level: Maximal Assistance - Patient 25 - 49%   Blood pressure (!) 152/88, pulse 70, temperature 98.2 F (36.8 C), resp. rate 16, height 6' (1.829 m), weight 86.9 kg, SpO2 97%.    Medical Problem List and Plan: 1. 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  Antithrombotics: -DVT/anticoagulation:  Pharmaceutical: Other (comment)--Pradaxa             -antiplatelet therapy: N/A 3. Pain Management: Intolereant of tylenol.  4. Mood/Behavior/Sleep: LCSW to follow for evaluation and support.              -antipsychotic agents: N/A             --will add melatonin prn for occasional insomnia 5. Neuropsych/cognition: This patient is not  capable of making decisions on his own behalf. 6. Skin/Wound Care: Routine pressure relief measures.  7. Fluids/Electrolytes/Nutrition:  Monitor I/O. Check CMET in am 8. A fib: Monitor HR TID--continue metoprolol and Pradaxa 9. T2DM w/neuropathy: Hgb A1c-7.1. Will start monitoring  BS ac/hs.  --Use SSI for elevated BS. Monitor intake             --Was on jardiance, Amaryl and metformin PTA  -increase metformin to 500mg  BID   10. Pre-renal azotemia: BUN trending up 26-->31. Encourage fluid intake. Placed nursing order to encourage 6-8 glasses of water per day   11. Chronic LBP/neck as well as hip, knee, shoulder: continue Voltaren gel added knees and right shoulder             --monitor for symptoms with increase in activity.             Kpad ordered  Will discuss tens unit   12. Suboptimal vitamin D: ergocalciferol 50,000U once per week started, continue  13. HTN: start magnesium supplement 250mg  HS, level reviewed and is stable at 2  14. Bilateral knee pain: neoprene knee sleeves ordered, discussed with patient and  he finds these helpful, continue voltaren gel.   15. Urinary continence: continue bladder program to take patient to bathroom q2H while awake  16. Red eyes:  clear eyes ordered  LOS: 3 days A FACE TO FACE EVALUATION WAS PERFORMED  Vaida Kerchner P Tempest Frankland 07/14/2023, 8:03 AM

## 2023-07-14 NOTE — IPOC Note (Addendum)
Overall Plan of Care [facility]) Patient Details Name: Cory Brown MRN: [id number] DOB: 1951-03-01  Admitting Diagnosis: Acute ischemic left internal carotid artery (ICA) stroke [facility])  Hospital Problems: Principal Problem:   Acute ischemic left internal carotid artery (ICA) stroke (HCC)     Functional Problem List: Nursing Safety, Bladder, Endurance, Medication Management, Motor, Pain  PT Balance, Behavior, Endurance, Motor, Safety, Sensory  OT Balance, Safety, Vision, Behavior, Endurance, Pain, Sensory, Cognition, Motor, Perception  SLP Cognition, Linguistic, Safety  TR         Basic ADL's: OT Eating, Toileting, Grooming, Bathing, Dressing     Advanced  ADL's: OT Simple Meal Preparation, Light Housekeeping, Laundry     Transfers: PT Bed Mobility, Bed to Chair, Customer service manager, Research scientist (life sciences): PT Psychologist, prison and probation services, Stairs, Ambulation     Additional Impairments: OT Fuctional Use of Upper Extremity  SLP Communication, Social Cognition expression, comprehension Problem Solving, Memory, Attention, Awareness  TR      Anticipated Outcomes Item Anticipated Outcome  Self Feeding mod I  Swallowing      Basic self-care  mod I UB- S LB  Toileting  S   Bathroom Transfers S  Bowel/Bladder  regain control of bowel and bladder  Transfers  supervision  Locomotion  supervision  Communication  supervision  Cognition  supervision  Pain  less than 4  Safety/Judgment  no falls   Therapy Plan: PT Intensity: Minimum of 1-2 x/day ,45 to 90 minutes PT Frequency: 5 out of 7 days PT Duration Estimated Length of Stay: 2-3 weeks OT Intensity: Minimum of 1-2 x/day, 45 to 90 minutes OT Frequency: 5 out of 7 days OT Duration/Estimated Length of Stay: 3 weeks SLP Intensity: Minumum of 1-2 x/day, 30 to 90 minutes SLP Frequency: 1 to 3 out of 7 days SLP Duration/Estimated Length of Stay: 3 weeks   Team Interventions: Nursing Interventions Patient/Family Education,  Medication Management, Bladder Management, Cognitive Remediation/Compensation, Disease Management/Prevention, Pain Management, Discharge Planning  PT interventions Ambulation/gait training, Discharge planning, Functional mobility training, Psychosocial support, Therapeutic Activities, Visual/perceptual remediation/compensation, Balance/vestibular training, Disease management/prevention, Neuromuscular re-education, Skin care/wound management, Therapeutic Exercise, Wheelchair propulsion/positioning, Cognitive remediation/compensation, DME/adaptive equipment instruction, Pain management, Splinting/orthotics, UE/LE Strength taining/ROM, Community reintegration, Development worker, international aid stimulation, Equities trader education, Museum/gallery curator, UE/LE Coordination activities  OT Interventions Warden/ranger, Cognitive remediation/compensation, Firefighter, Discharge planning, Disease mangement/prevention, Fish farm manager, Functional electrical stimulation, Functional mobility training, Neuromuscular re-education, Pain management, Patient/family education, Psychosocial support, Self Care/advanced ADL retraining, Skin care/wound managment, Splinting/orthotics, Therapeutic Activities, Therapeutic Exercise, UE/LE Strength taining/ROM, UE/LE Coordination activities, Visual/perceptual remediation/compensation, Wheelchair propulsion/positioning  SLP Interventions Cognitive remediation/compensation, Multimodal communication approach, Speech/Language facilitation, Cueing hierarchy, Functional tasks, Therapeutic Activities, Internal/external aids, Patient/family education  TR Interventions    SW/CM Interventions Discharge Planning, Psychosocial Support, Patient/Family Education, Disease Management/Prevention   Barriers to Discharge MD  Medical stability  Nursing Home environment access/layout, Incontinence, Lack of/limited family support, Insurance for SNF coverage, Medication  compliance home alone to 1 level with 6 steps no rail  PT Decreased caregiver support, Home environment access/layout, Incontinence, Lack of/limited family support, Behavior    OT Inaccessible home environment, Decreased caregiver support, Home environment access/layout cog, strength, lives alone and STE home  SLP      SW Lack of/limited family support, Decreased caregiver support, Community education officer for SNF coverage     Team Discharge Planning: Destination: PT-Home ,OT- Home , SLP-Home Projected Follow-up: PT-Home health PT, OT-  Home health OT, SLP-Outpatient SLP Projected Equipment Needs: PT- ,  OT- 3 in 1 bedside comode, Tub/shower bench, SLP-None recommended by SLP Equipment Details: PT-TBD, OT-may need commode and TTB, says he has a RW and SPT but may be inaccurate Patient/family involved in discharge planning: PT- Patient,  OT-Patient, SLP-Patient  MD ELOS: 2-3 weeks Medical Rehab Prognosis:  Excellent Assessment: The patient has been admitted for CIR therapies with the diagnosis of acute left internal carotid artery CVA. The team will be addressing functional mobility, strength, stamina, balance, safety, adaptive techniques and equipment, self-care, bowel and bladder mgt, patient and caregiver education. Goals have been set at supervision. Anticipated discharge destination is home.         See Team Conference Notes for weekly updates to the plan of care

## 2023-07-14 NOTE — Progress Notes (Signed)
Occupational Therapy Session Note  Patient Details  Name: Cory Brown MRN: [id number] Date of Birth: 1950-12-13  Today's Date: 07/14/2023 OT Individual Time: 2130-8657 OT Individual Time Calculation (min): 29 min    Short Term Goals: Week 1:  OT Short Term Goal 1 (Week 1): Pt will complete self feeding with dist s including r hand integration and container opening OT Short Term Goal 2 (Week 1): Pt will stand [place] for simple grooming 2 min with CGA and min cues for sequencing OT Short Term Goal 3 (Week 1): Pt will complete toilet and TTB transfer with min a and min cues with RW support  Skilled Therapeutic Interventions/Progress Updates:  Patient completed self feeding with 1 verbal cue and then initiated independently thereafter to include R hand into opening containers.   He was able to self feed with his R UE.    He completed R neuro re education in bed for a brief period before asking to rest and stating he did not feel well and "I feel sort of off."  His request was granted, and he was assisted with max A more comfortably supine with HOB at approx 20 degrees, call bell left withinreach, bed alarm engaged.  He will continue to benefit from OT services to address deficits and goals for increased ADLs/IADLs and other functional activities and occupations.  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

## 2023-07-15 DIAGNOSIS — I63232 Cerebral infarction due to unspecified occlusion or stenosis of left carotid arteries: Secondary | ICD-10-CM | POA: Diagnosis not present

## 2023-07-15 LAB — CBC WITH DIFFERENTIAL/PLATELET
Abs Immature Granulocytes: 0.03 10*3/uL (ref 0.00–0.07)
Basophils Absolute: 0.1 10*3/uL (ref 0.0–0.1)
Basophils Relative: 1 %
Eosinophils Absolute: 0.2 10*3/uL (ref 0.0–0.5)
Eosinophils Relative: 3 %
HCT: 47.7 % (ref 39.0–52.0)
Hemoglobin: 16.4 g/dL (ref 13.0–17.0)
Immature Granulocytes: 1 %
Lymphocytes Relative: 17 %
Lymphs Abs: 1.1 10*3/uL (ref 0.7–4.0)
MCH: 32 pg (ref 26.0–34.0)
MCHC: 34.4 g/dL (ref 30.0–36.0)
MCV: 93.2 fL (ref 80.0–100.0)
Monocytes Absolute: 0.7 10*3/uL (ref 0.1–1.0)
Monocytes Relative: 11 %
Neutro Abs: 4.1 10*3/uL (ref 1.7–7.7)
Neutrophils Relative %: 67 %
Platelets: 188 10*3/uL (ref 150–400)
RBC: 5.12 MIL/uL (ref 4.22–5.81)
RDW: 12.8 % (ref 11.5–15.5)
WBC: 6.2 10*3/uL (ref 4.0–10.5)
nRBC: 0 % (ref 0.0–0.2)

## 2023-07-15 LAB — TROPONIN I (HIGH SENSITIVITY)
Troponin I (High Sensitivity): 16 ng/L (ref <18)
Troponin I (High Sensitivity): 17 ng/L (ref <18)

## 2023-07-15 LAB — BASIC METABOLIC PANEL
Anion gap: 7 (ref 5–15)
BUN: 28 mg/dL — ABNORMAL HIGH (ref 8–23)
CO2: 26 mmol/L (ref 22–32)
Calcium: 9.3 mg/dL (ref 8.9–10.3)
Chloride: 103 mmol/L (ref 98–111)
Creatinine, Ser: 1.02 mg/dL (ref 0.61–1.24)
GFR, Estimated: >60 mL/min (ref >60)
Glucose, Bld: 226 mg/dL — ABNORMAL HIGH (ref 70–99)
Potassium: 4 mmol/L (ref 3.5–5.1)
Sodium: 136 mmol/L (ref 135–145)

## 2023-07-15 LAB — GLUCOSE, CAPILLARY
Glucose-Capillary: 194 mg/dL — ABNORMAL HIGH (ref 70–99)
Glucose-Capillary: 186 mg/dL — ABNORMAL HIGH (ref 70–99)
Glucose-Capillary: 181 mg/dL — ABNORMAL HIGH (ref 70–99)
Glucose-Capillary: 178 mg/dL — ABNORMAL HIGH (ref 70–99)

## 2023-07-15 MED ORDER — MUSCLE RUB 10-15 % EX CREA
TOPICAL_CREAM | Freq: Three times a day (TID) | CUTANEOUS | Status: DC
  Administered 2023-07-18: 1 via TOPICAL

## 2023-07-15 MED ORDER — NITROGLYCERIN 0.4 MG SL SUBL
0.4000 mg | SUBLINGUAL_TABLET | SUBLINGUAL | Status: DC | PRN
  Administered 2023-07-15: 0.4 mg via SUBLINGUAL
  Filled 2023-07-15: qty 1

## 2023-07-15 NOTE — Progress Notes (Signed)
Occupational Therapy Session Note  Patient Details  Name: Cory Brown MRN: [id number] Date of Birth: [birth year]  Today's Date: 07/15/2023 OT Individual Time: 5956-3875 OT Individual Time Calculation (min): 54 min    Short Term Goals: Week 1:  OT Short Term Goal 1 (Week 1): Pt will complete self feeding with dist s including r hand integration and container opening OT Short Term Goal 2 (Week 1): Pt will stand [place] for simple grooming 2 min with CGA and min cues for sequencing OT Short Term Goal 3 (Week 1): Pt will complete toilet and TTB transfer with min a and min cues with RW support  Skilled Therapeutic Interventions/Progress Updates:      Therapy Documentation Precautions:  Precautions Precautions: Fall Precaution Comments: aphaisa, 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 OT spoke to nurse before session. Nurse reported medicated pt prior to session. Nurse reported okay for bed level activities. No new symptoms reported during session.   Pain: No pain reported  Exercises: Pt issued UE theraband HEP in order to increase functional strength, andurance and activity tolerance in order to increase independence in ADLs such as bathing. Pt issued yellow theraband and discussed direction/technique of exercises, demonstrating verbal understanding. Pt completed 3x10 exercises at bed level listed below: - shoulder horizontal abduction -shoulder flexion -external rotation  Pt issued seated UE kinesthetic/theraband HEP in order to increase functional strength, endurance and activity tolerance in order to increase independence in ADLs such as bathing. Pt issued yellow theraband and discussed direction/technique of exercises, demonstrating verbal understanding. Pt completed 3x10 exercises at W/C level listed below:  Exercises - Seated Shoulder Shrugs  - -  3 sets - 10 reps - Seated Boxing Jabs  -  3 sets - 10 reps - Seated Scapular Protraction and Retraction  -- 3 sets - 10 reps - Seated Elbow Extension with Self-Anchored Resistance  - 3 sets - 10 reps - Seated Elbow Flexion with Self-Anchored Resistance  -3 sets - 10 reps   Other Treatments: Pt educated and completed self ROM shoulder flexion/extension 2x10 in order to increase strength/ROM in RUE in order to complete ADLs such as bathing. Pt educated on mental imagery in order to increase NMRE on RUE for increased ROM. Pt provided therapeutic encouragement for progress. Pt provided encouragement to keep participating in therapy d/t disappointment in progress. OT discussed that healing takes time for physical and mental state.    Pt supine in bed with bed alarm activated, 2 bed rails up, call light within reach and 4Ps assessed.   Therapy/Group: Individual Therapy  Velia Meyer, OTD, OTR/L 07/15/2023, 11:16 AM

## 2023-07-15 NOTE — Progress Notes (Signed)
Physical Therapy Session Note  Patient Details  Name: Cory Brown MRN: [id number] Date of Birth: [birth year]  Today's Date: 07/15/2023 PT Individual Time: 0800-0900 PT Individual Time Calculation (min): 60 min   Short Term Goals: Week 1:  PT Short Term Goal 1 (Week 1): Pt will ambulate 150 feet with LRAD and CGA PT Short Term Goal 2 (Week 1): Pt will perform bed mobility with CGA with LRAD PT Short Term Goal 3 (Week 1): pt will perform stand pivot transfer with CGA with LRAD  Skilled Therapeutic Interventions/Progress Updates:      Therapy Documentation Precautions:  Precautions Precautions: Fall Precaution Comments: aphaisa, 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 Pt with unrated burning chest pain in session, nurse notified and administered medication in session and vitals assessed WNL.   Pulse: 77  O2: 100   BP: 156/85  PT also applied bilateral neoprene sleeves for knee pain in session.   Pt min A with gait throughout session with SPC and with left HHA. Pt presents motor planning deficits and difficulty coordinating SPC. Pt transitioned to gait training without AD with left min HHA to facilitate automatic stepping strategy and pt displays improved sequencing, step length and foot clearance with gait. PT provided auditory cues via music to assist with pacing and verbal cues for upirght posture and gaze.   Pt ambulated following distances throughout session:   150 ft SPC min A   345 ft x 2   Pt ambulated to room with significant imbalance compared to gait within session. Pt also reports blurred vision and room spinning. Nurse notified and pt and left semi-reclined in bed with all needs in reach and alarm on.     Therapy/Group: Individual Therapy  Truitt Leep Truitt Leep PT, DPT  07/15/2023, 7:50 AM

## 2023-07-15 NOTE — Progress Notes (Signed)
PROGRESS NOTE   Subjective/Complaints: No complaints this morning, but later in the morning he complained of chest pain that sounded like indigestion, nursing administered maalox   ROS: +new urinary incontinence, +bilateral knee pain, +red eyes, +chest pain   Objective:   No results found. No results for input(s): "WBC", "HGB", "HCT", "PLT" in the last 72 hours.  No results for input(s): "NA", "K", "CL", "CO2", "GLUCOSE", "BUN", "CREATININE", "CALCIUM" in the last 72 hours.   Intake/Output Summary (Last 24 hours) at 07/15/2023 0912 Last data filed at 07/15/2023 0750 Gross per 24 hour  Intake 960 ml  Output 4 ml  Net 956 ml        Physical Exam: Vital Signs Blood pressure (!) 159/75, pulse 73, temperature 98.2 F (36.8 C), temperature source Oral, resp. rate 16, height 6' (1.829 m), weight 86.9 kg, SpO2 98%. Gen: no distress, normal appearing HEENT: oral mucosa pink and moist, NCAT Cardio: Reg rate Chest: normal effort, normal rate of breathing Abd: soft, non-distended Ext: no edema Psych: pleasant, normal affect  Cardiovascular:     Rate and Rhythm: Rhythm irregular.  Pulm: breathing comfortably on room air Abdomen: NT, ND Skin:    General: Skin is warm and dry.     Findings: Bruising present.  Neurological:     Mental Status: He is alert.     Comments: Oriented to self and DOB. Mild right facial droop. He was unable to recall age, details leading to admission, unable to recall month, year, season or hospital. Expressive deficits--was unable to describe being in the hospital. He was able to follow simple motor commands.   Psych: pleasant GU: urinary incontinence   Assessment/Plan: 1. Functional deficits which require 3+ hours per day of interdisciplinary therapy in a comprehensive inpatient rehab setting. Physiatrist is providing close team supervision and 24 hour management of active medical problems  listed below. Physiatrist and rehab team continue to assess barriers to discharge/monitor patient progress toward functional and medical goals  Care Tool:  Bathing    Body parts bathed by patient: Right arm, Left arm, Chest, Abdomen, Front perineal area, Right upper leg, Left upper leg, Face, Buttocks, Right lower leg, Left lower leg   Body parts bathed by helper: Buttocks, Left lower leg, Right lower leg     Bathing assist Assist Level: Contact Guard/Touching assist     Upper Body Dressing/Undressing Upper body dressing   What is the patient wearing?: Pull over shirt    Upper body assist Assist Level: Supervision/Verbal cueing    Lower Body Dressing/Undressing Lower body dressing      What is the patient wearing?: Underwear/pull up, Pants     Lower body assist Assist for lower body dressing: Moderate Assistance - Patient 50 - 74%     Toileting Toileting    Toileting assist Assist for toileting: Moderate Assistance - Patient 50 - 74%     Transfers Chair/bed transfer  Transfers assist     Chair/bed transfer assist level: Contact Guard/Touching assist     Locomotion Ambulation   Ambulation assist      Assist level: Contact Guard/Touching assist Assistive device: Cane-straight Max distance: 93   Walk 10 feet activity  Assist     Assist level: Contact Guard/Touching assist Assistive device: Cane-straight   Walk 50 feet activity   Assist    Assist level: Contact Guard/Touching assist Assistive device: Cane-straight    Walk 150 feet activity   Assist Walk 150 feet activity did not occur: Safety/medical concerns    Assistive device: Hand held assist    Walk 10 feet on uneven surface  activity   Assist     Assist level: Moderate Assistance - Patient - 50 - 74% Assistive device: Hand held assist   Wheelchair     Assist Is the patient using a wheelchair?: Yes Type of Wheelchair: Manual      Max wheelchair distance: 50     Wheelchair 50 feet with 2 turns activity    Assist        Assist Level: Moderate Assistance - Patient 50 - 74%   Wheelchair 150 feet activity     Assist      Assist Level: Maximal Assistance - Patient 25 - 49%   Blood pressure (!) 159/75, pulse 73, temperature 98.2 F (36.8 C), temperature source Oral, resp. rate 16, height 6' (1.829 m), weight 86.9 kg, SpO2 98%.    Medical Problem List and Plan: 1. 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  Antithrombotics: -DVT/anticoagulation:  Pharmaceutical: Other (comment)--Pradaxa             -antiplatelet therapy: N/A 3. Pain Management: Intolereant of tylenol.  4. Mood/Behavior/Sleep: LCSW to follow for evaluation and support.              -antipsychotic agents: N/A             --will add melatonin prn for occasional insomnia 5. Neuropsych/cognition: This patient is not  capable of making decisions on his own behalf. 6. Skin/Wound Care: Routine pressure relief measures.  7. Fluids/Electrolytes/Nutrition:  Monitor I/O. Check CMET in am 8. A fib: Monitor HR TID--continue metoprolol and Pradaxa, increase magnesium to 500mg  HS  9. T2DM w/neuropathy: Hgb A1c-7.1. Will start monitoring  BS ac/hs.  --Use SSI for elevated BS. Monitor intake             --Was on jardiance, Amaryl and metformin PTA  -increase metformin to 500mg  BID   10. Pre-renal azotemia: BUN trending up 26-->31. Encourage fluid intake. Placed nursing order to encourage 6-8 glasses of water per day   11. Chronic LBP/neck as well as hip, knee, shoulder: continue Voltaren gel added knees and right shoulder             --monitor for symptoms with increase in activity.             Kpad ordered  Will discuss tens unit   12. Suboptimal vitamin D: ergocalciferol 50,000U  once per week started, continue  13. HTN: start magnesium supplement 250mg  HS, level reviewed and is stable at 2  14. Bilateral knee pain: neoprene knee sleeves ordered, discussed with patient and he finds these helpful, continue voltaren gel.   15. Urinary continence: continue bladder program to take patient to bathroom q2H while awake  16. Red eyes: continue clear eyes  17. Indigestion: continue maalox  18. Chest pain: troponin, EKG, CBC, BMP, and nitroglycerin ordered, if resolved with maalox I will d/c these orders  LOS: 4 days A FACE TO FACE EVALUATION WAS PERFORMED  Avon Molock P Regan Llorente 07/15/2023, 9:12 AM

## 2023-07-15 NOTE — Progress Notes (Signed)
Pt is 72 year old male laying calmly on bed. Pt interacting with staff appropriately. Pt noted to be CAOx2 with confusion to time and situation. Pt complains of upper gastric pain on a scale 3/10 that he describes as a "burning." Pt has history of same complaint and is currently receiving treatment. PT states that the pain is eased when sitting upright versus laying down. Eyes are PERRLA with pink and moist membranes. Sclera noted to be red with current treatment on going with eye drops. Mouth has no obvious deformities or abnormalities with pink membranes. Ears noted to be clear of drainage with no obvious abnormalities noted. Pt has midline trachea with no JVD noted. Pulses noted bilaterally with no obvious swelling or edema. Chest wall has equal expansion during respiration with clear lung sounds bilaterally. Pacemaker in place and working appropriately. Pt's abdomen noted to be flat with no distention, rigidity, or bruising. Bowel sounds present when auscultated in all quadrants. Pt denies nausea or vomiting. Pt's last BM was 9/14. Pt voids urine appropriately via bedside urinal. Pt denies pain or discomfort during urination. Upper extremities has good pulses bilaterally. Right arm weakness with unequal grip strength. Good pulse motor sensation to left arm and hand. Lower extremities has good pulses bilaterally. Left leg weakness noted with left foot weakness. Pt's skin is warm and dry with good coloration for ethnicity with no obvious abrasions, lacerations, or breakdown.   I agree with this student's documentation.

## 2023-07-15 NOTE — Progress Notes (Signed)
Physical Therapy Session Note  Patient Details  Name: Cory Brown MRN: [id number] Date of Birth: [birth year]  Today's Date: 07/15/2023 PT Missed Time: 45 Minutes Missed Time Reason: Patient fatigue  Short Term Goals: Week 1:  PT Short Term Goal 1 (Week 1): Pt will ambulate 150 feet with LRAD and CGA PT Short Term Goal 2 (Week 1): Pt will perform bed mobility with CGA with LRAD PT Short Term Goal 3 (Week 1): pt will perform stand pivot transfer with CGA with LRAD  Skilled Therapeutic Interventions/Progress Updates:      Therapy Documentation Precautions:  Precautions Precautions: Fall Precaution Comments: aphaisa, 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     Therapy/Group: Individual Therapy  Truitt Leep Truitt Leep PT, DPT  07/15/2023, 3:46 PM

## 2023-07-15 NOTE — Progress Notes (Addendum)
Patient ID: Cory Brown, male   DOB: [birth year], 72 y.o.   MRN: [id number]  Patient reported to therapist that he had burning in his chest. Therapists in turn reported to this RN. Upon further questioning, he felt it was more related to indigestion, 30mL Maalox given. Upon returning to room with therapist, his gait was more unsteady than ambulating to the gym, and his vision was blurred. Has a history of blurred vision. This RN reported these findings to Dr. Carlis Abbott. Orders placed for Nitroglycerin SL 0.4 mg. every 5 minutes PRN, for chest pain, Troponin I Stat and then every 2 hours, and EKG 12-lead. Will continue to monitor patient and keep Dr. Carlis Abbott updated........Marland Kitchen   1000: EKG completed, Troponin drawn by lab, Nitroglycerin SL 0.4 mg given per Dr. Carlis Abbott. Will continue to monitor patient and keep Dr. Carlis Abbott informed.  1015: Reassessment of Nitroglycerin, patient sleeping comfortably. Dr. Carlis Abbott updated.

## 2023-07-15 NOTE — Progress Notes (Cosign Needed)
Pt is 72 year old male laying calmly on bed. Pt interacting with staff appropriately. Pt noted to be CAOx2 with confusion to time and situation. Pt complains of upper gastric pain on a scale 3/10 that he describes as a "burning." Pt has history of same complaint and is currently receiving treatment. PT states that the pain is eased when sitting upright versus laying down. Eyes are PERRLA with pink and moist membranes. Sclera noted to be red with current treatment on going with eye drops. Mouth has no obvious deformities or abnormalities with pink membranes. Ears noted to be clear of drainage with no obvious abnormalities noted. Pt has midline trachea with no JVD noted. Pulses noted bilaterally with no obvious swelling or edema. Chest wall has equal expansion during respiration with clear lung sounds bilaterally. Pacemaker in place and working appropriately. Pt's abdomen noted to be flat with no distention, rigidity, or bruising. Bowel sounds present when auscultated in all quadrants. Pt denies nausea or vomiting. Pt's last BM was 9/14. Pt voids urine appropriately via bedside urinal. Pt denies pain or discomfort during urination. Upper extremities has good pulses bilaterally. Right arm weakness with unequal grip strength. Good pulse motor sensation to left arm and hand. Lower extremities has good pulses bilaterally. Left leg weakness noted with left foot weakness. Pt's skin is warm and dry with good coloration for ethnicity with no obvious abrasions, lacerations, or breakdown.

## 2023-07-15 NOTE — Progress Notes (Signed)
Uneventful night.  Sitting on EOB x 1 during night. Bed alarm alerted staff. Eye gtts used at HS. LBM 09/14. Continent of B&B. Scheduled Voltaren and sports cream managing pain at night. Denies right shoulder pain. Cory Brown A

## 2023-07-15 NOTE — Progress Notes (Signed)
Occupational Therapy Note  Patient Details  Name: Cory Brown MRN: [id number] Date of Birth: [birth year]  Today's Date: 07/15/2023 OT Missed Time: 30 Minutes Missed Time Reason: Patient fatigue  After checking with RN re medical status, pt seen for skilled OT services. Pt sleeping upon arrival but easily aroused. Pt reports he is "wiped out" and frustrated that he is so tired and fatigues so quickly. Pt declined offer to bathe and shave. Pt declined getting OOB and indicates he just wants to rest. Pt missed 30 mins skilled OT serives.   Lavone Neri [facility] 07/15/2023, 1:20 PM

## 2023-07-16 DIAGNOSIS — R7989 Other specified abnormal findings of blood chemistry: Secondary | ICD-10-CM

## 2023-07-16 DIAGNOSIS — E1142 Type 2 diabetes mellitus with diabetic polyneuropathy: Secondary | ICD-10-CM

## 2023-07-16 DIAGNOSIS — I10 Essential (primary) hypertension: Secondary | ICD-10-CM

## 2023-07-16 DIAGNOSIS — R079 Chest pain, unspecified: Secondary | ICD-10-CM

## 2023-07-16 DIAGNOSIS — I63232 Cerebral infarction due to unspecified occlusion or stenosis of left carotid arteries: Secondary | ICD-10-CM | POA: Diagnosis not present

## 2023-07-16 LAB — GLUCOSE, CAPILLARY
Glucose-Capillary: 172 mg/dL — ABNORMAL HIGH (ref 70–99)
Glucose-Capillary: 177 mg/dL — ABNORMAL HIGH (ref 70–99)
Glucose-Capillary: 209 mg/dL — ABNORMAL HIGH (ref 70–99)
Glucose-Capillary: 209 mg/dL — ABNORMAL HIGH (ref 70–99)

## 2023-07-16 LAB — CBC
HCT: 46.5 % (ref 39.0–52.0)
Hemoglobin: 16.5 g/dL (ref 13.0–17.0)
MCH: 33.5 pg (ref 26.0–34.0)
MCHC: 35.5 g/dL (ref 30.0–36.0)
MCV: 94.3 fL (ref 80.0–100.0)
Platelets: 171 10*3/uL (ref 150–400)
RBC: 4.93 MIL/uL (ref 4.22–5.81)
RDW: 12.8 % (ref 11.5–15.5)
WBC: 6.1 10*3/uL (ref 4.0–10.5)
nRBC: 0 % (ref 0.0–0.2)

## 2023-07-16 LAB — BASIC METABOLIC PANEL
Anion gap: 9 (ref 5–15)
BUN: 24 mg/dL — ABNORMAL HIGH (ref 8–23)
CO2: 24 mmol/L (ref 22–32)
Calcium: 9.2 mg/dL (ref 8.9–10.3)
Chloride: 105 mmol/L (ref 98–111)
Creatinine, Ser: 1.06 mg/dL (ref 0.61–1.24)
GFR, Estimated: >60 mL/min (ref >60)
Glucose, Bld: 171 mg/dL — ABNORMAL HIGH (ref 70–99)
Potassium: 4.2 mmol/L (ref 3.5–5.1)
Sodium: 138 mmol/L (ref 135–145)

## 2023-07-16 NOTE — Progress Notes (Signed)
Restless night without specific complaint of. No further complaint of burning sensation to chest. Doesn't call for assistance, bed alarm in use to remind patient and alert staff. Attempted OOB 3 times this shift, thus far. PRN clear eyes given at 1955, eyes still very red. Bilateral knee pain managed with scheduled voltaren & muscle rub. LBM 09/14. Continent of [employer]. Alfredo Martinez A

## 2023-07-16 NOTE — Progress Notes (Signed)
Physical Therapy Session Note  Patient Details  Name: Cory Brown MRN: [id number] Date of Birth: 05/05/1951  Today's Date: 07/16/2023 PT Individual Time: 1030-1130 PT Individual Time Calculation (min): 60 min   Short Term Goals: Week 1:  PT Short Term Goal 1 (Week 1): Pt will ambulate 150 feet with LRAD and CGA PT Short Term Goal 2 (Week 1): Pt will perform bed mobility with CGA with LRAD PT Short Term Goal 3 (Week 1): pt will perform stand pivot transfer with CGA with LRAD  Skilled Therapeutic Interventions/Progress Updates:    Pt presents in recliner and agreeable to session. Sit <> stands with CGA throughout session for functional transfers without device and with SPC. Gait training with SPC x 80' x 2 trials with SPC in R and L hand - pt naturally places in R hand but suggested to trial in L hand which actually allowed  patient increased step length on R. Pt continues with shuffled and forward flexed gait pattern.   NMR for balance training with focus on dynamic standing balance and dynamic gait with lateral stepping, retro gait, standing on compliant surface while performing functional reaching task outside BOS and then to lower surface to promote squats. Pt required overall CGA for balance and cues for technique needed.  Stair negotiation training for balance and coordination training and general strengthening for functional mobility improvement x 8 steps with CGA overall with bilateral handrail use.   End of session focused on gait training back to room x 150' with initial CGA progressing to min assist as fatigued. End of session requested to use bathroom and performed min assist gait into bathroom without AD and performed toileting in standing with CGA for balance during clothing management. Transitioned to bed at end of session with supervision and extra time to return to supine.     Five times Sit to Stand Test (FTSS) Method: Use a straight back chair with a solid seat that is  16-18" high. Ask participant to sit on the chair with arms folded across their chest.   Instructions: "Stand up and sit down as quickly as possible 5 times, keeping your arms folded across your chest."   Measurement: Stop timing when the participant stands the 5th time.  TIME: __35____ (in seconds) -using UEs for support  Times > 13.6 seconds is associated with increased disability and morbidity (Guralnik, 2000) Times > 15 seconds is predictive of recurrent falls in healthy individuals aged 27 and older (Buatois, et al., 2008) Normal performance values in community dwelling individuals aged 2 and older (Bohannon, 2006): 60-69 years: 11.4 seconds 70-79 years: 12.6 seconds 80-89 years: 14.8 seconds  MCID: >= 2.3 seconds for Vestibular Disorders Wray Kearns, 2006)   Therapy Documentation Precautions:  Precautions Precautions: Fall Precaution Comments: aphaisa, 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      Therapy/Group: Individual Therapy  Karolee Stamps Darrol Poke, PT, DPT, CBIS  07/16/2023, 12:04 PM

## 2023-07-16 NOTE — Progress Notes (Signed)
Occupational Therapy Session Note  Patient Details  Name: Cory Brown MRN: [id number] Date of Birth: 08-08-1951  Today's Date: 07/16/2023 OT Individual Time: 0805-0900 OT Individual Time Calculation (min): 55 min    Short Term Goals: Week 1:  OT Short Term Goal 1 (Week 1): Pt will complete self feeding with dist s including r hand integration and container opening OT Short Term Goal 2 (Week 1): Pt will stand skinkside for simple grooming 2 min with CGA and min cues for sequencing OT Short Term Goal 3 (Week 1): Pt will complete toilet and TTB transfer with min a and min cues with RW support  Skilled Therapeutic Interventions/Progress Updates:  Session 1 Skilled OT intervention completed with focus on sleep education, activity tolerance, functional transfers and ADL retraining. Pt received upright in bed with nursing providing meds, agreeable to session. 3/10 pain reported in Rt knee; pre-medicated. OT offered rest breaks and repositioning for pain reduction.  Pt expressed that he's "tired of always being sick" and "waking up feeling so tired." Per report, pt states he hasn't been sleeping well and at home usually has no problem. Education provided about options such as natural supplements (melatonin) that MD could order, as well as staying OOB/awake during day, and doing therapy outside in sunshine to assist with circadian rhythm. However pt politely declined OT messaging MD for intervention.  Transitioned to EOB with CGA increasing to min A to scoot forward for BLE to contact floor as pt with BUE tremors that challenged gross motor coordination. Pt verbalized feeling "swimmy headed" but has had this "every other day for a long time." Light min A sit > stand, then pt sought external support for pivot therefore Lt HHA and min A offered for stand pivot > w/c with verbal cues needed for RLE motor planning and positioning.   Seated at sink, pt completed oral care with set up A. Required cues for  sequencing for facial shaving, with again trimerous BUE with RUE > LUE, with 1 small nick observed though pt unaware; clotted with a min of pressure. Pt reports having this for a while and he gets "frustrated with how it affects his ability to daily things." MD notified for potential med intervention if applicable.  Pt able to wash UB and donn shirt with set up A, did need cues for attending to items on Rt side of sink with anticipated mild Rt inattention. Pt unable to recall correct stroke affected side, with cues needed for hemi technique with LB dressing, able to thread underwear with min A, then supervision for pants however pt unable to recall hemi technique. CGA sit > stand using sink for balance and CGA for balance in stance for donning over hips. Donned socks/shoes with total A.  CGA sit > stand and stand pivot > recliner. Pt remained seated in recliner, with belt alarm on/activated, window shades left open for alertness and with all needs in reach at end of session.   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

## 2023-07-16 NOTE — Plan of Care (Signed)
  Problem: RH BOWEL ELIMINATION Goal: RH STG MANAGE BOWEL WITH ASSISTANCE Description: STG Manage Bowel with min/mod Assistance. Outcome: Progressing Goal: RH STG MANAGE BOWEL W/MEDICATION W/ASSISTANCE Description: STG Manage Bowel with Medication with min Assistance. Outcome: Progressing   Problem: RH BLADDER ELIMINATION Goal: RH STG MANAGE BLADDER WITH ASSISTANCE Description: STG Manage Bladder With min/mod Assistance Outcome: Progressing   Problem: RH SKIN INTEGRITY Goal: RH STG SKIN FREE OF INFECTION/BREAKDOWN Description: Incision to right groin will continue to heal and be free of infection/breakdown with min assist  Outcome: Progressing Goal: RH STG MAINTAIN SKIN INTEGRITY WITH ASSISTANCE Description: STG Maintain Skin Integrity With min Assistance. Outcome: Progressing   Problem: RH SAFETY Goal: RH STG ADHERE TO SAFETY PRECAUTIONS W/ASSISTANCE/DEVICE Description: STG Adhere to Safety Precautions With min Assistance/Device. Outcome: Not Progressing Goal: RH STG DECREASED RISK OF FALL WITH ASSISTANCE Description: STG Decreased Risk of Fall With min Assistance. Outcome: Not Progressing   Problem: RH COGNITION-NURSING Goal: RH STG ANTICIPATES NEEDS/CALLS FOR ASSIST W/ASSIST/CUES Description: STG Anticipates Needs/Calls for Assist With min Assistance/Cues. Outcome: Not Progressing   Problem: RH PAIN MANAGEMENT Goal: RH STG PAIN MANAGED AT OR BELOW PT'S PAIN GOAL Description: Less than 4 with PRN medications min assist Outcome: Progressing

## 2023-07-16 NOTE — Progress Notes (Signed)
PROGRESS NOTE   Subjective/Complaints: Patient had cardiac workup yesterday.  Chest pain has resolved today.   ROS: +new urinary incontinence, +bilateral knee pain, +red eyes, denies shortness of breath or chest pain   Objective:   No results found. Recent Labs    07/15/23 0932 07/16/23 0619  WBC 6.2 6.1  HGB 16.4 16.5  HCT 47.7 46.5  PLT 188 171    Recent Labs    07/15/23 0932 07/16/23 0619  NA 136 138  K 4.0 4.2  CL 103 105  CO2 26 24  GLUCOSE 226* 171*  BUN 28* 24*  CREATININE 1.02 1.06  CALCIUM 9.3 9.2     Intake/Output Summary (Last 24 hours) at 07/16/2023 1424 Last data filed at 07/16/2023 1319 Gross per 24 hour  Intake 1320 ml  Output --  Net 1320 ml        Physical Exam: Vital Signs Blood pressure 124/66, pulse 70, temperature 98.5 F (36.9 C), temperature source Oral, resp. rate 18, height 6' (1.829 m), weight 86.9 kg, SpO2 98%. Gen: no distress, normal appearing HEENT: oral mucosa pink and moist, NCAT Cardio: Rhythm irregular.  Chest: CTAB, normal effort, normal rate of breathing Abd: soft, non-distended, +BS Ext: no edema Psych: pleasant, normal affect  Skin:    General: Skin is warm and dry.     Findings: Bruising present.  Neurological:     Mental Status: He is alert.     Comments: Oriented to self and DOB. Mild right facial droop. He was unable to recall age, details leading to admission, unable to recall month, year, season or hospital. Expressive deficits--was unable to describe being in the hospital. He was able to follow simple motor commands.   Psych: pleasant GU: urinary incontinence   Assessment/Plan: 1. Functional deficits which require 3+ hours per day of interdisciplinary therapy in a comprehensive inpatient rehab setting. Physiatrist is providing close team supervision and 24 hour management of active medical problems listed below. Physiatrist and rehab team  continue to assess barriers to discharge/monitor patient progress toward functional and medical goals  Care Tool:  Bathing    Body parts bathed by patient: Right arm, Left arm, Chest, Abdomen, Front perineal area, Right upper leg, Left upper leg, Face, Buttocks, Right lower leg, Left lower leg   Body parts bathed by helper: Buttocks, Left lower leg, Right lower leg     Bathing assist Assist Level: Contact Guard/Touching assist     Upper Body Dressing/Undressing Upper body dressing   What is the patient wearing?: Pull over shirt    Upper body assist Assist Level: Set up assist    Lower Body Dressing/Undressing Lower body dressing      What is the patient wearing?: Underwear/pull up, Pants     Lower body assist Assist for lower body dressing: Minimal Assistance - Patient > 75%     Toileting Toileting    Toileting assist Assist for toileting: Moderate Assistance - Patient 50 - 74%     Transfers Chair/bed transfer  Transfers assist     Chair/bed transfer assist level: Contact Guard/Touching assist     Locomotion Ambulation   Ambulation assist      Assist  level: Contact Guard/Touching assist Assistive device: Cane-straight Max distance: 150'   Walk 10 feet activity   Assist     Assist level: Contact Guard/Touching assist Assistive device: Cane-straight   Walk 50 feet activity   Assist    Assist level: Contact Guard/Touching assist Assistive device: Cane-straight    Walk 150 feet activity   Assist Walk 150 feet activity did not occur: Safety/medical concerns  Assist level: Minimal Assistance - Patient > 75% Assistive device: Cane-straight    Walk 10 feet on uneven surface  activity   Assist     Assist level: Minimal Assistance - Patient > 75% Assistive device: Biochemist, clinical     Assist Is the patient using a wheelchair?: Yes Type of Wheelchair: Manual      Max wheelchair distance: 50    Wheelchair 50 feet  with 2 turns activity    Assist        Assist Level: Moderate Assistance - Patient 50 - 74%   Wheelchair 150 feet activity     Assist      Assist Level: Maximal Assistance - Patient 25 - 49%   Blood pressure 124/66, pulse 70, temperature 98.5 F (36.9 C), temperature source Oral, resp. rate 18, height 6' (1.829 m), weight 86.9 kg, SpO2 98%.    Medical Problem List and Plan: 1. 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  Antithrombotics: -DVT/anticoagulation:  Pharmaceutical: Other (comment)--Pradaxa             -antiplatelet therapy: N/A 3. Pain Management: Intolereant of tylenol.  4. Mood/Behavior/Sleep: LCSW to follow for evaluation and support.              -antipsychotic agents: N/A             --will add melatonin prn for occasional insomnia 5. Neuropsych/cognition: This patient is not  capable of making decisions on his own behalf. 6. Skin/Wound Care: Routine pressure relief measures.  7. Fluids/Electrolytes/Nutrition:  Monitor I/O. Check CMET in am 8. A fib: Monitor HR TID--continue metoprolol and Pradaxa, increase magnesium to 500mg  HS  9. T2DM w/neuropathy: Hgb A1c-7.1. Will start monitoring  BS ac/hs.  --Use SSI for elevated BS. Monitor intake             --Was on jardiance, Amaryl and metformin PTA  -increase metformin to 500mg  BID   -9/16 CBGs controlled overall, continue current medications   10. Pre-renal azotemia: BUN trending up 26-->31. Encourage fluid intake. Placed nursing order to encourage 6-8 glasses of water per day  -9/16 BUN a little improved at 24, creatinine 1.06.  Continue to encourage oral p.o. intake   11. Chronic LBP/neck as well as hip, knee, shoulder: continue Voltaren gel added knees and right shoulder             --monitor for symptoms  with increase in activity.             Kpad ordered  Will discuss tens unit   12. Suboptimal vitamin D: ergocalciferol 50,000U once per week started, continue  13. HTN: start magnesium supplement 250mg  HS, level reviewed and is stable at 2  -9/16  BP controlled overall, continue current regimen     07/16/2023    1:24 PM 07/16/2023    5:35 AM 07/16/2023    2:59 AM  Vitals with BMI  Systolic 124 152 846  Diastolic 66 78 80  Pulse 70 70 72     14. Bilateral knee pain: neoprene knee sleeves ordered, discussed with patient and he finds these helpful, continue voltaren gel.   15. Urinary continence: continue bladder program to take patient to bathroom q2H while awake  16. Red eyes: continue clear eyes   17. Indigestion: continue maalox  18. Chest pain: troponin, EKG, CBC, BMP, and nitroglycerin ordered, if resolved with maalox I will d/c these orders  -9/16 chest pain has resolved today, troponins were not elevated  LOS: 5 days A FACE TO FACE EVALUATION WAS PERFORMED  Fanny Dance 07/16/2023, 2:24 PM

## 2023-07-17 DIAGNOSIS — I63232 Cerebral infarction due to unspecified occlusion or stenosis of left carotid arteries: Secondary | ICD-10-CM | POA: Diagnosis not present

## 2023-07-17 LAB — GLUCOSE, CAPILLARY
Glucose-Capillary: 175 mg/dL — ABNORMAL HIGH (ref 70–99)
Glucose-Capillary: 182 mg/dL — ABNORMAL HIGH (ref 70–99)
Glucose-Capillary: 135 mg/dL — ABNORMAL HIGH (ref 70–99)
Glucose-Capillary: 164 mg/dL — ABNORMAL HIGH (ref 70–99)

## 2023-07-17 MED ORDER — PROPRANOLOL HCL 20 MG PO TABS
10.0000 mg | ORAL_TABLET | Freq: Every day | ORAL | Status: DC
  Administered 2023-07-17 – 2023-07-24 (×8): 10 mg via ORAL
  Filled 2023-07-17 (×8): qty 1

## 2023-07-17 NOTE — Progress Notes (Signed)
Occupational Therapy Session Note  Patient Details  Name: Cory Brown MRN: [id number] Date of Birth: 1951-02-23  Today's Date: 07/17/2023 OT Individual Time: 1401-1430 OT Individual Time Calculation (min): 29 min    Short Term Goals: Week 1:  OT Short Term Goal 1 (Week 1): Pt will complete self feeding with dist s including r hand integration and container opening OT Short Term Goal 2 (Week 1): Pt will stand skinkside for simple grooming 2 min with CGA and min cues for sequencing OT Short Term Goal 3 (Week 1): Pt will complete toilet and TTB transfer with min a and min cues with RW support  Skilled Therapeutic Interventions/Progress Updates:   Pt seen for brief skilled OT session this pm. Pt asleep bed level upon OT arrival and denies need for toileting. Agreeable to transfer from bed to recliner with bed mobility with S and SPT with RW with close S and min cues for hand placement. Stood for simple UB cane exercises to address standing balance and overall activity tolerance. 2 sets of 10 reps scap, sh, trunk with seated rest between intervals and CGA for balance. Seated push ups x 5 reps x 3 sets. Pt able to assist OT to log activity in memory log with min cues but needed mod cues to use written external memory aides for RO for time but able to state hospital. Left pt up in recliner for last session with chair alarm set, needs and nurse call button in reach as well as 4P's reinforced.   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

## 2023-07-17 NOTE — Progress Notes (Signed)
Speech Language Pathology Daily Session Note  Patient Details  Name: Cory Brown MRN: [id number] Date of Birth: [birth year]  Today's Date: 07/17/2023 SLP Individual Time: 0865-7846 SLP Individual Time Calculation (min): 42 min  Short Term Goals: Week 1: SLP Short Term Goal 1 (Week 1): Patient will demonstrate orientation to person, place, time, and situation with minA and use of external aids SLP Short Term Goal 2 (Week 1): Patient will follow two-step functional directions with 80% accuracy given minA SLP Short Term Goal 3 (Week 1): Patient will communicate mildly complex thoughts and ideas in 4/5 opportunities utilizing provided expressive language strategies with modA SLP Short Term Goal 4 (Week 1): Patient will recall basic daily events utilizing provided memory strategies as needed given modA SLP Short Term Goal 5 (Week 1): Patient will solve basic environmental problems in 4/5 opportunities given modA  Skilled Therapeutic Interventions: SLP conducted skilled therapy session targeting cognitive goals. Patient disoriented to date, location, and situation, requiring frequent reorientation throughout session and maxA to utilize external aids. Patient indicates strong intellectual awareness of these deficits, frequently making self-deprecating comments about how he "can't do anything right" and how his "mind doesn't work right anymore." SLP guided patient through addition to memory log, outlining all therapy sessions completed this date. Patient benefited from max cues to add to log in an organized manner and to interpret previous events from schedule. Patient required totalA to remember specific events from each session. During calendar problem solving task, patient required modA to solve. During mildly complex conversation, patient communication ideas with 80% accuracy given minA. Patient was left in chair with call bell in reach and chair alarm set. SLP will continue to target goals per plan of  care.       Pain Pain Assessment Pain Scale: 0-10 Pain Score: 5  Pain Location: Rib cage  Therapy/Group: Individual Therapy  Cory Brown, M.A., CCC-SLP  Cory Brown 07/17/2023, 3:50 PM

## 2023-07-17 NOTE — Progress Notes (Signed)
Patient ID: Cory Brown, male   DOB: [birth year], 72 y.o.   MRN: [id number]  Sw made attempt to call son, Barbara Cower for family edu. Unable to leave VM due to VM box full. SW will continue to FU.

## 2023-07-17 NOTE — Progress Notes (Signed)
PROGRESS NOTE   Subjective/Complaints: No new complaints this morning Asked if he has chest pain and he says he still has it once in a while, discussed that he can use nitroglycerin prn   ROS: +new urinary incontinence, +bilateral knee pain, +red eyes, +chest pain occasionally    Objective:   No results found. Recent Labs    07/15/23 0932 07/16/23 0619  WBC 6.2 6.1  HGB 16.4 16.5  HCT 47.7 46.5  PLT 188 171    Recent Labs    07/15/23 0932 07/16/23 0619  NA 136 138  K 4.0 4.2  CL 103 105  CO2 26 24  GLUCOSE 226* 171*  BUN 28* 24*  CREATININE 1.02 1.06  CALCIUM 9.3 9.2     Intake/Output Summary (Last 24 hours) at 07/17/2023 1045 Last data filed at 07/17/2023 0700 Gross per 24 hour  Intake 1170 ml  Output --  Net 1170 ml        Physical Exam: Vital Signs Blood pressure (!) 144/94, pulse 72, temperature 98.9 F (37.2 C), temperature source Oral, resp. rate 16, height 6' (1.829 m), weight 86.9 kg, SpO2 97%. Gen: no distress, normal appearing, BMI 25.98 HEENT: oral mucosa pink and moist, NCAT Cardio: Rhythm irregular.  Chest: CTAB, normal effort, normal rate of breathing Abd: soft, non-distended, +BS Ext: no edema Psych: pleasant, normal affect  Skin:    General: Skin is warm and dry.     Findings: Bruising present.  Neurological:     Mental Status: He is alert.     Comments: Oriented to self and DOB. Mild right facial droop. He was unable to recall age, details leading to admission, unable to recall month, year, season or hospital. Expressive deficits--was unable to describe being in the hospital. He was able to follow simple motor commands.   Psych: pleasant GU: urinary incontinence   Assessment/Plan: 1. Functional deficits which require 3+ hours per day of interdisciplinary therapy in a comprehensive inpatient rehab setting. Physiatrist is providing close team supervision and 24 hour  management of active medical problems listed below. Physiatrist and rehab team continue to assess barriers to discharge/monitor patient progress toward functional and medical goals  Care Tool:  Bathing    Body parts bathed by patient: Right arm, Left arm, Chest, Abdomen, Front perineal area, Right upper leg, Left upper leg, Face, Buttocks, Right lower leg, Left lower leg   Body parts bathed by helper: Buttocks, Left lower leg, Right lower leg     Bathing assist Assist Level: Contact Guard/Touching assist     Upper Body Dressing/Undressing Upper body dressing   What is the patient wearing?: Pull over shirt    Upper body assist Assist Level: Set up assist    Lower Body Dressing/Undressing Lower body dressing      What is the patient wearing?: Underwear/pull up, Pants     Lower body assist Assist for lower body dressing: Minimal Assistance - Patient > 75%     Toileting Toileting    Toileting assist Assist for toileting: Minimal Assistance - Patient > 75%     Transfers Chair/bed transfer  Transfers assist     Chair/bed transfer assist level: Contact Guard/Touching  assist     Locomotion Ambulation   Ambulation assist      Assist level: Contact Guard/Touching assist Assistive device: Cane-straight Max distance: 75   Walk 10 feet activity   Assist     Assist level: Contact Guard/Touching assist Assistive device: Cane-straight   Walk 50 feet activity   Assist    Assist level: Contact Guard/Touching assist Assistive device: Cane-straight    Walk 150 feet activity   Assist Walk 150 feet activity did not occur: Safety/medical concerns  Assist level: Minimal Assistance - Patient > 75% Assistive device: Cane-straight    Walk 10 feet on uneven surface  activity   Assist     Assist level: Minimal Assistance - Patient > 75% Assistive device: Biochemist, clinical     Assist Is the patient using a wheelchair?: Yes Type of  Wheelchair: Manual      Max wheelchair distance: 50    Wheelchair 50 feet with 2 turns activity    Assist        Assist Level: Moderate Assistance - Patient 50 - 74%   Wheelchair 150 feet activity     Assist      Assist Level: Maximal Assistance - Patient 25 - 49%   Blood pressure (!) 144/94, pulse 72, temperature 98.9 F (37.2 C), temperature source Oral, resp. rate 16, height 6' (1.829 m), weight 86.9 kg, SpO2 97%.    Medical Problem List and Plan: 1. 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  Antithrombotics: -DVT/anticoagulation:  Pharmaceutical: Other (comment)--Pradaxa             -antiplatelet therapy: N/A 3. Pain Management: Intolereant of tylenol.  4. Mood/Behavior/Sleep: LCSW to follow for evaluation and support.              -antipsychotic agents: N/A             --will add melatonin prn for occasional insomnia 5. Neuropsych/cognition: This patient is not  capable of making decisions on his own behalf. 6. Skin/Wound Care: Routine pressure relief measures.  7. Fluids/Electrolytes/Nutrition:  Monitor I/O. Check CMET in am 8. A fib: Monitor HR TID--continue metoprolol and Pradaxa, increase magnesium to 500mg  HS  9. T2DM w/neuropathy: Hgb A1c-7.1. Will start monitoring  BS ac/hs.  --Use SSI for elevated BS. Monitor intake             --Was on jardiance, Amaryl and metformin PTA  -increase metformin to 500mg  BID   -9/16 CBGs controlled overall, continue current medications   10. Pre-renal azotemia: BUN trending up 26-->31. Encourage fluid intake. Placed nursing order to encourage 6-8 glasses of water per day  -9/16 BUN a little improved at 24, creatinine 1.06.  Continue to encourage oral p.o. intake   11. Chronic LBP/neck as well as hip, knee, shoulder:  continue Voltaren gel added knees and right shoulder             --monitor for symptoms with increase in activity.             Kpad ordered  Will discuss tens unit   12. Suboptimal vitamin D: ergocalciferol 50,000U once per week started,  continue  13. HTN: start magnesium supplement 250mg  HS, level reviewed and is stable at 2, start propanolol 10mg  daily     07/17/2023    5:43 AM 07/16/2023    7:48 PM 07/16/2023    1:24 PM  Vitals with BMI  Systolic 144 134 413  Diastolic 94 64 66  Pulse 72 71 70     14. Bilateral knee pain: neoprene knee sleeves ordered, discussed with patient and he finds these helpful, continue voltaren gel.   15. Urinary continence: continue bladder program to take patient to bathroom q2H while awake  16. Red eyes: continue clear eyes, discussed improvement  17. Indigestion: continue maalox  18. Chest pain: continue prn nitrolgycerin  19. Essential tremor: start propanolol 10mg  daily   LOS: 6 days A FACE TO FACE EVALUATION WAS PERFORMED  Remijio Holleran P Damyia Strider 07/17/2023, 10:45 AM

## 2023-07-17 NOTE — Progress Notes (Signed)
Physical Therapy Session Note  Patient Details  Name: Cory Brown MRN: [id number] Date of Birth: [birth year]  Today's Date: 07/17/2023 PT Individual Time: 0800-0915 PT Individual Time Calculation (min): 75 min   Short Term Goals: Week 1:  PT Short Term Goal 1 (Week 1): Pt will ambulate 150 feet with LRAD and CGA PT Short Term Goal 2 (Week 1): Pt will perform bed mobility with CGA with LRAD PT Short Term Goal 3 (Week 1): pt will perform stand pivot transfer with CGA with LRAD  Skilled Therapeutic Interventions/Progress Updates: Pt presents sitting EOB finishing breakfast and agreeable to therapy.  Pt transfers sit to stand w/ min A w/ cream to B knees for pain.  Pt amb to BR per request w/ CGA and "furniture- cruising".  Pt min A/CGA for clothing management and transfer to toilet.  Pt continent of bowel and bladder in toilet (charted in Flowsheets).  Pt required increased time for pericare but performed while standing.  Pt stands at sink for handwashing and brushing teeth, pt attempts to use toothpaste for hands.  Pt amb to bed w/ CGA.  Pt required total A for B knee sleeves.  Pt required A to initiate scrub pants over RLE and then able to complete LLE.  Pt transfers to standing w/ CGA and completes pulling pants over hips.  Pt wheeled to dayroom for time and energy conservation.  Pt amb w/ SPC and CGA x 75' including turns, but difficulty w/ objects/turns to right.  Pt cued for SPC in L hand and for management.  Pt improved fluidity w/ multiple trials of gait w/ cane.  Pt performed stepping over walking stick and back w/ CGA, but increased unsteadiness/LOB w/ performing "cross" 2/2 increased step lengths required.  Pt requires seated rest breaks between, stating back pain.  Pt amb from hallway to recliner w/ SPC and CGA/close supervision and cues for foot clearance and cane management.  Pt remained reclined in recliner w/ chair alarm on and all needs in reach, blanket given for comfort.     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

## 2023-07-17 NOTE — Progress Notes (Signed)
Occupational Therapy Session Note  Patient Details  Name: Cory Brown MRN: [id number] Date of Birth: 1951/10/24  Today's Date: 07/17/2023 OT Individual Time: 1105-1200 OT Individual Time Calculation (min): 55 min    Short Term Goals: Week 1:  OT Short Term Goal 1 (Week 1): Pt will complete self feeding with dist s including r hand integration and container opening OT Short Term Goal 2 (Week 1): Pt will stand skinkside for simple grooming 2 min with CGA and min cues for sequencing OT Short Term Goal 3 (Week 1): Pt will complete toilet and TTB transfer with min a and min cues with RW support  Skilled Therapeutic Interventions/Progress Updates:  Skilled OT intervention completed with focus on ambulatory transfers, activity tolerance, cognition. Pt received seated in recliner, agreeable to session. Intermittent pain reported in both knees; pre-medicated and knee sleeves already on. OT offered rest breaks, repositioning throughout for pain reduction.  Encouraged wear of shoes for joint support. Donned with total A for time. NT present for CBG check.  Generally completed all sit > stands and ambulatory transfers with CGA using SPC during session, however assist did increase to min A with fatigue onset at end of session.   Ambulated > 100 ft > gym. Seated rest break provided. In stance, pt completed peg board design for standing tolerance and cognitive strategies (problem solving/awareness) to promote independence with BADLs. Able to stand without AD with close supervision, without LOB, however with increase in pain in knees after several mins therefore continued seated. 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 Transitioned to sorting deck of cards by number. Pt with max difficulty requiring step by step cues for recalling task objective and for proper sequence.   Ambulated > 100 ft back to room. Pt continued to report "I just can't  get my mind right." Pt only oriented to self when assessed requiring signs in room for visual reminder of place, but pt unable to recall situation of a stroke. Reorientation provided. The above reinforces importance of 24/7 supervision at DC.  Pt remained seated in recliner, with belt alarm on/activated, and with all needs in reach at end of session.   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

## 2023-07-18 DIAGNOSIS — I63232 Cerebral infarction due to unspecified occlusion or stenosis of left carotid arteries: Secondary | ICD-10-CM | POA: Diagnosis not present

## 2023-07-18 LAB — GLUCOSE, CAPILLARY
Glucose-Capillary: 193 mg/dL — ABNORMAL HIGH (ref 70–99)
Glucose-Capillary: 170 mg/dL — ABNORMAL HIGH (ref 70–99)
Glucose-Capillary: 153 mg/dL — ABNORMAL HIGH (ref 70–99)
Glucose-Capillary: 139 mg/dL — ABNORMAL HIGH (ref 70–99)

## 2023-07-18 MED ORDER — TRAMADOL HCL 50 MG PO TABS
50.0000 mg | ORAL_TABLET | Freq: Every day | ORAL | Status: DC
  Administered 2023-07-18 – 2023-07-19 (×2): 50 mg via ORAL
  Filled 2023-07-18 (×2): qty 1

## 2023-07-18 MED ORDER — METFORMIN HCL 850 MG PO TABS
850.0000 mg | ORAL_TABLET | Freq: Two times a day (BID) | ORAL | Status: DC
  Administered 2023-07-18 – 2023-07-25 (×14): 850 mg via ORAL
  Filled 2023-07-18 (×14): qty 1

## 2023-07-18 NOTE — Progress Notes (Signed)
PROGRESS NOTE   Subjective/Complaints: He tried the [facility] today for his hip pain but it seemed to worsen his pain He liked knee braces so will order for him, he does not like the neoprene knee sleeve   ROS: +new urinary incontinence, +bilateral knee pain, +red eyes, +chest pain occasionally, +hip pain   Objective:   No results found. Recent Labs    07/16/23 0619  WBC 6.1  HGB 16.5  HCT 46.5  PLT 171    Recent Labs    07/16/23 0619  NA 138  K 4.2  CL 105  CO2 24  GLUCOSE 171*  BUN 24*  CREATININE 1.06  CALCIUM 9.2     Intake/Output Summary (Last 24 hours) at 07/18/2023 1128 Last data filed at 07/18/2023 0700 Gross per 24 hour  Intake 354 ml  Output --  Net 354 ml        Physical Exam: Vital Signs Blood pressure 129/70, pulse 72, temperature 97.8 F (36.6 C), temperature source Oral, resp. rate 16, height 6' (1.829 m), weight 86.9 kg, SpO2 98%. Gen: no distress, normal appearing, BMI 25.98 HEENT: oral mucosa pink and moist, NCAT Cardio: Rhythm irregular.  Chest: CTAB, normal effort, normal rate of breathing Abd: soft, non-distended, +BS Ext: no edema Psych: pleasant, normal affect  Skin:    General: Skin is warm and dry.     Findings: Bruising present.  Neurological:     Mental Status: He is alert.     Comments: Oriented to self and DOB. Mild right facial droop. He was unable to recall age, details leading to admission, unable to recall month, year, season or hospital. Expressive deficits--was unable to describe being in the hospital. He was able to follow simple motor commands.   Psych: pleasant GU: urinary incontinence MSK: TTP hip, lower back, knees   Assessment/Plan: 1. Functional deficits which require 3+ hours per day of interdisciplinary therapy in a comprehensive inpatient rehab setting. Physiatrist is providing close team supervision and 24 hour management of active medical problems  listed below. Physiatrist and rehab team continue to assess barriers to discharge/monitor patient progress toward functional and medical goals  Care Tool:  Bathing    Body parts bathed by patient: Right arm, Left arm, Chest, Abdomen, Front perineal area, Right upper leg, Left upper leg, Face, Buttocks, Right lower leg, Left lower leg   Body parts bathed by helper: Buttocks, Left lower leg, Right lower leg     Bathing assist Assist Level: Contact Guard/Touching assist     Upper Body Dressing/Undressing Upper body dressing   What is the patient wearing?: Pull over shirt    Upper body assist Assist Level: Set up assist    Lower Body Dressing/Undressing Lower body dressing      What is the patient wearing?: Underwear/pull up, Pants     Lower body assist Assist for lower body dressing: Minimal Assistance - Patient > 75%     Toileting Toileting    Toileting assist Assist for toileting: Minimal Assistance - Patient > 75%     Transfers Chair/bed transfer  Transfers assist     Chair/bed transfer assist level: Contact Guard/Touching assist     Locomotion  Ambulation   Ambulation assist      Assist level: Contact Guard/Touching assist Assistive device: Cane-straight Max distance: 126ft   Walk 10 feet activity   Assist     Assist level: Contact Guard/Touching assist Assistive device: Cane-straight   Walk 50 feet activity   Assist    Assist level: Contact Guard/Touching assist Assistive device: Cane-straight    Walk 150 feet activity   Assist Walk 150 feet activity did not occur: Safety/medical concerns  Assist level: Contact Guard/Touching assist Assistive device: Cane-straight    Walk 10 feet on uneven surface  activity   Assist     Assist level: Minimal Assistance - Patient > 75% Assistive device: Cane-straight   Wheelchair     Assist Is the patient using a wheelchair?: Yes Type of Wheelchair: Manual      Max wheelchair  distance: 50    Wheelchair 50 feet with 2 turns activity    Assist        Assist Level: Moderate Assistance - Patient 50 - 74%   Wheelchair 150 feet activity     Assist      Assist Level: Maximal Assistance - Patient 25 - 49%   Blood pressure 129/70, pulse 72, temperature 97.8 F (36.6 C), temperature source Oral, resp. rate 16, height 6' (1.829 m), weight 86.9 kg, SpO2 98%.    Medical Problem List and Plan: 1. Functional deficits secondary to acute left internal carotid artery CVA             -patient may shower             -ELOS/Goals: 2-3 weeks days S             Continue CIR  Notes reviewed, ambulating 65 feet MinA with since point cane  -Interdisciplinary Team Conference today   2.  Antithrombotics: -DVT/anticoagulation:  Pharmaceutical: Other (comment)--Pradaxa             -antiplatelet therapy: N/A 3. Pain Management: Intolereant of tylenol.  4. Mood/Behavior/Sleep: LCSW to follow for evaluation and support.              -antipsychotic agents: N/A             --will add melatonin prn for occasional insomnia 5. Neuropsych/cognition: This patient is not  capable of making decisions on his own behalf. 6. Skin/Wound Care: Routine pressure relief measures.  7. Fluids/Electrolytes/Nutrition:  Monitor I/O. Check CMET in am 8. A fib: Monitor HR TID--continue metoprolol and Pradaxa, increase magnesium to 500mg  HS  9. T2DM w/neuropathy: Hgb A1c-7.1. Will start monitoring  BS ac/hs.  --Use SSI for elevated BS. Monitor intake             --Was on jardiance, Amaryl and metformin PTA  -increase metformin to 500mg  BID   -9/16 CBGs controlled overall, continue current medications   10. Pre-renal azotemia: BUN trending up 26-->31. Encourage fluid intake. Placed nursing order to encourage 6-8 glasses of water per day  -9/16 BUN a little improved at 24, creatinine 1.06.  Continue to encourage oral p.o. intake   11. Chronic LBP/neck as well as hip, knee, shoulder:  continue Voltaren gel added knees and right shoulder             --monitor for symptoms with increase in activity.             Kpad ordered  Will discuss tens unit   12. Suboptimal vitamin D: ergocalciferol 50,000U once per week started, continue  13.  HTN: start magnesium supplement 250mg  HS, level reviewed and is stable at 2, start propanolol 10mg  daily     07/18/2023    5:10 AM 07/17/2023    9:09 PM 07/17/2023    2:03 PM  Vitals with BMI  Systolic 129 145 562  Diastolic 70 80 77  Pulse 72 73 75     14. Bilateral knee pain: neoprene knee sleeves ordered, discussed with patient and he finds these helpful, continue voltaren gel. Knee braces ordered, tramadol scheduled  15. Urinary continence: continue bladder program to take patient to bathroom q2H while awake  16. Red eyes: continue clear eyes, discussed improvement  17. Indigestion: continue maalox  18. Chest pain: continue prn nitrolgycerin  19. Essential tremor: continue propanolol 10mg  daily   >50 minutes spent in discussing nexwave, knee brace, ordering knee brace for him at home, discussing his progress with therapy during team conference, discussed propanolol started for tremor  LOS: 7 days A FACE TO FACE EVALUATION WAS PERFORMED  Clint Bolder P Zaven Klemens 07/18/2023, 11:28 AM

## 2023-07-18 NOTE — Progress Notes (Signed)
Occupational Therapy Session Note  Patient Details  Name: Cory Brown MRN: [id number] Date of Birth: 24-Aug-1951  Today's Date: 07/18/2023 OT Individual Time: 8469-6295 OT Individual Time Calculation (min): 30 min  and Today's Date: 07/18/2023 OT Missed Time: 15 Minutes Missed Time Reason: Patient fatigue   Short Term Goals: Week 1:  OT Short Term Goal 1 (Week 1): Pt will complete self feeding with dist s including r hand integration and container opening OT Short Term Goal 2 (Week 1): Pt will stand skinkside for simple grooming 2 min with CGA and min cues for sequencing OT Short Term Goal 3 (Week 1): Pt will complete toilet and TTB transfer with min a and min cues with RW support  Skilled Therapeutic Interventions/Progress Updates:  Skilled OT intervention completed with focus on toileting needs, functional transfers and activity tolerances. Pt received in standing at toilet with NT present. Pt agreeable to session. Denied pain but winced with movement of his knees; pre-medicated. OT offered rest breaks and modifications for pain relief.  Pt with difficulty urinating in stance; CGA provided but required several mins for voiding small amount. Ambulated with min A without AD to w/c with mod cues for foot clearance and body positioning and as pt seeking externally for balance and pt appearing unsteady. Pt expressed fatigue but agreeable to try therapy. Hand hygiene at sink with supervision seated.  Transported dependently in w/c <> gym. Pt completed the following intervals while seated at the SCIFIT to promote global/BUE endurance needed for independence with BADLs and functional transfers: -7 min, level 2, forwards  Pt with increased lethargy during session, with inability to keep eyes open despite having conversation with therapist and music being played. Pt requested to return to bed 2/2 fatigue. Back in room, pt completed CGA sit > stand and ambulatory transfer without AD > EOB. Doffed  shoes independently and transitioned > Lt side lying with mod I.   Pt remained in Lt side lying, with bed alarm on/activated, k-pad applied for heat to Rt hip for pain relief and with all needs in reach at end of session. Pt missed 15 mins of OT intervention secondary to fatigue/lethargy; OT will make up missed time as able.    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

## 2023-07-18 NOTE — Plan of Care (Signed)
Updated Plan of Care Goals downgraded due to severity of impairments and shortened estimated length of stay:  Problem: RH Cognition - SLP Goal: RH LTG Patient will demonstrate orientation with cues Description:  LTG:  Patient will demonstrate orientation to person/place/time/situation with cues (SLP)   Flowsheets (Taken 07/18/2023 1139) LTG: Patient will demonstrate orientation using cueing (SLP): Minimal Assistance - Patient > 75%   Problem: RH Comprehension Communication Goal: LTG Patient will comprehend basic/complex auditory (SLP) Description: LTG: Patient will comprehend basic/complex auditory information with cues (SLP). Flowsheets Taken 07/18/2023 1139 LTG: Patient will comprehend auditory information with cueing (SLP): Minimal Assistance - Patient > 75% Taken 07/12/2023 1351 LTG: Patient will comprehend: Basic auditory information   Problem: RH Expression Communication Goal: LTG Patient will verbally express basic/complex needs(SLP) Description: LTG:  Patient will verbally express basic/complex needs, wants or ideas with cues  (SLP) Flowsheets (Taken 07/12/2023 1351) LTG: Patient will verbally express basic/complex needs, wants or ideas (SLP): Supervision   Problem: RH Problem Solving Goal: LTG Patient will demonstrate problem solving for (SLP) Description: LTG:  Patient will demonstrate problem solving for basic/complex daily situations with cues  (SLP) Flowsheets Taken 07/18/2023 1139 LTG Patient will demonstrate problem solving for: Moderate Assistance - Patient 50 - 74% Taken 07/12/2023 1351 LTG: Patient will demonstrate problem solving for (SLP): Basic daily situations   Problem: RH Memory Goal: LTG Patient will follow step by step directions w/cues (SLP) Description: LTG: Patient will follow step by step directions with cues (SLP). Flowsheets Taken 07/18/2023 1139 LTG: Patient will follow step by step directions w/cues:  Moderate Assistance - Patient 50 - 74% Taken 07/12/2023 1351 LTG: Patient will follow step by step directions: 2 steps Goal: LTG Patient will demonstrate ability for day to day (SLP) Description: LTG:   Patient will demonstrate ability for day to day recall/carryover during cognitive/linguistic activities with assist  (SLP) Flowsheets Taken 07/18/2023 1139 LTG: Patient will demonstrate ability for day to day recall/carryover during cognitive/linguistic activities with assist (SLP): Moderate Assistance - Patient 50 - 74% Taken 07/12/2023 1351 LTG: Patient will demonstrate ability for day to day recall: New information

## 2023-07-18 NOTE — Progress Notes (Signed)
Physical Therapy Session Note  Patient Details  Name: Cory Brown MRN: [id number] Date of Birth: 31-Mar-1951  Today's Date: 07/18/2023 PT Individual Time: 1027-2536 and 1300-1340 PT Individual Time Calculation (min): 69 min and 40 min  Short Term Goals: Week 1:  PT Short Term Goal 1 (Week 1): Pt will ambulate 150 feet with LRAD and CGA PT Short Term Goal 2 (Week 1): Pt will perform bed mobility with CGA with LRAD PT Short Term Goal 3 (Week 1): pt will perform stand pivot transfer with CGA with LRAD  Skilled Therapeutic Interventions/Progress Updates:   Treatment Session 1 Received pt sitting EOB with NT present - PT took over with care. Pt agreeable to PT treatment and reported pain in bilateral knees and R hip (unrated) but declined any pain interventions initially. Pt reported knee sleeves don't help and requested to take them off - therapist assisted with removing both knee sleeves. Session with emphasis on functional mobility/transfers, generalized strengthening and endurance, stair navigation, and gait training.   Pt transferred bed<>WC stand<>pivot without AD and CGA and transported to/from room in [facility] dependently for energy conservation purposes. Stood with SPC and CGA and ambulated 121ft with SPC and CGA - encouraged pt to hold cane in L hand. Pt ambulates with decreased cadence, narrow BOS, decreased step/stride length, and downward gaze - cues for upright posture/gaze and to increase step length. MD arrived for morning rounds and discussed using NexWave for knee pain. Set up device with IFC to L knee and discussed having pt's son assist with setting up device. Pt then ambulated 58ft x 2 trials with SPC and CGA to/from staircase and navigated 4 6in steps with bilateral handrails and CGA ascending and descending with a step to pattern. Pt reported NexWave causing increased pain in L knee that resolved when device was removed. Discussed with MD not using NexWave due to cognitive impairments  impacting pt's ability to safely use device.    Set pt up for activity in // bars, then pt expressed that this is the first day he's had "real bad pain" in L knee and "pulling in back" and ultimately unable to continue with session. Of note, pt with great difficulty expressing and describing this pain, requiring increased time and numerous questions from therapist to communicate clearly. Transferred mat<>WC stand<>pivot with SPC and CGA, returned to room, and ambulated to recliner. RN notified and present to administer pain medication and applied heat packs to bilateral knees and k-pad to back. Pt expressed being "in misery" today and with difficulty expressing himself - notified treatment team. Concluded session with pt sitting in recliner, needs within reach, and seatbelt alarm on. Memory notebook updated.   Treatment Session 2 Received pt sitting in recliner, pt agreeable to PT treatment, and reported pain in bilateral knees but stated "I'm okay". Session with emphasis on functional mobility/transfers, generalized strengthening and endurance, dynamic standing balance/coordination, NMR, and gait training. Pt transported to/from room in [facility] dependently for time management purposes. Stood from [facility] with RW and CGA and ambulated 146ft with RW and CGA - noted pt with short, shuffling steps and difficulty following cues for improved foot clearance and step length. Pt's gait pattern did not change much with SPC vs RW, although pt was able to ambulate longer distances with less knee discomfort.   Worked on dynamic standing balance, spatial awareness, and problem solving matching cards to suit and number x 2 trials. Pt with 1 error on each trial getting suit/color mixed up, requiring assist  for correction. Pt able to pick up 2 cards from floor with CGA for balance. Transferred mat<>WC stand<>pivot without AD and CGA and returned to room. Concluded session with pt sitting in WC, needs within reach, and seatbelt alarm  on.   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

## 2023-07-18 NOTE — Progress Notes (Signed)
Occupational Therapy Weekly Progress Note  Patient Details  Name: Cory Brown MRN: [id number] Date of Birth: [birth year]  Beginning of progress report period: July 12, 2023 End of progress report period: July 18, 2023   Patient has met 3 of 3 short term goals. Pt is making gradual progress towards LTGs. He is able to bathe with CGA overall at the shower level, dress LB with min A and requires CGA/min A for toileting tasks. Pt's biggest deficits include global deconditioning, dynamic balance, and cognition (memory, delayed processing, awareness and problem solving) resulting in difficulty completing BADL tasks without increased physical assist. Pt will benefit from continued skilled OT services to focus on mentioned deficits. Anticipate pt will require 24/7 supervision/support largely due to cognitive deficits, therefore family education needed prior to DC and not initiated as of this date.    Patient continues to demonstrate the following deficits: muscle weakness, decreased cardiorespiratoy endurance, decreased coordination and decreased motor planning, decreased initiation, decreased awareness, decreased problem solving, decreased safety awareness, decreased memory, and delayed processing, and decreased standing balance and hemiplegia and therefore will continue to benefit from skilled OT intervention to enhance overall performance with BADL and Reduce care partner burden.  Patient progressing toward long term goals..  Continue plan of care.  OT Short Term Goals Week 1:  OT Short Term Goal 1 (Week 1): Pt will complete self feeding with dist s including r hand integration and container opening OT Short Term Goal 1 - Progress (Week 1): Met OT Short Term Goal 2 (Week 1): Pt will stand [place] for simple grooming 2 min with CGA and min cues for sequencing OT Short Term Goal 2 - Progress (Week 1): Met OT Short Term Goal 3 (Week 1): Pt will complete toilet and TTB transfer with min a  and min cues with RW support OT Short Term Goal 3 - Progress (Week 1): Met Week 2:  OT Short Term Goal 1 (Week 2): STG = LTG due to ELOS    Romaine Neville E Neasia Fleeman, MS, OTR/L  07/18/2023, 3:50 PM

## 2023-07-18 NOTE — Progress Notes (Signed)
Patient ID: Cory Brown, male   DOB: [birth year], 72 y.o.   MRN: [id number]  Team Conference Report to Patient/Family  Team Conference discussion was reviewed with the patient and caregiver, including goals, any changes in plan of care and target discharge date.  Patient and caregiver express understanding and are in agreement.  The patient has a target discharge date of 07/25/23.  SW met with patient, called son, Barbara Cower at bedside and provided team conference updates.Son will be present today. Family education arranged for 9/21 9-12. Patient currently has a cane at home. Patient potential to require a RW and BSC. No additional questions or concerns.  Andria Rhein 07/18/2023, 1:35 PM

## 2023-07-18 NOTE — Progress Notes (Signed)
Speech Language Pathology Daily Session Note  Patient Details  Name: Cory Brown MRN: [id number] Date of Birth: Aug 07, 1951  Today's Date: 07/18/2023 SLP Individual Time: 1345-1441 SLP Individual Time Calculation (min): 56 min  Short Term Goals: Week 1: SLP Short Term Goal 1 (Week 1): Patient will demonstrate orientation to person, place, time, and situation with minA and use of external aids SLP Short Term Goal 2 (Week 1): Patient will follow two-step functional directions with 80% accuracy given minA SLP Short Term Goal 3 (Week 1): Patient will communicate mildly complex thoughts and ideas in 4/5 opportunities utilizing provided expressive language strategies with modA SLP Short Term Goal 4 (Week 1): Patient will recall basic daily events utilizing provided memory strategies as needed given modA SLP Short Term Goal 5 (Week 1): Patient will solve basic environmental problems in 4/5 opportunities given modA  Skilled Therapeutic Interventions: SLP conducted skilled therapy session targeting cognitive retraining goals. Patient received upright in wheelchair and agreeable to session. Patient utilized schedule to recall previous events with maxA and utilized memory log updated by PT from morning session to recall details of events from therapy with maxA. Using external aids, patient oriented to date with minA but required maxA to utilize room sign to recall location. Patient disoriented to situation requiring frequent reeducation throughout session of stroke and purpose of current admission. During basic temporal problem solving utilizing calendar as visual aid, patient required modA to answer basic questions (e.g. how many days are left this month, how many days until you are expecting to go home?, etc.) SLP facilitated card sorting task to target working memory and awareness with patient requiring maxA to sort cards and recall purpose of task. Patient would frequently forget target, requiring  reorientation to task intermittently. SLP updated memory book with events that took place during session and reviewed with patient to reinforce. Near the end of the session, patient stated "I don't think I can go on anymore, my stomach hurts", reporting sudden 8 out of 10 pain. Session ended and RN alerted. Patient was left in chair with call bell in reach and chair alarm set. SLP will continue to target goals per plan of care.      Pain Pain Assessment Pain Score: 8  Pain Location: Abdomen Pain Intervention(s): RN made aware  Therapy/Group: Individual Therapy  [name], M.A., CCC-SLP  [name] 07/18/2023, 3:08 PM

## 2023-07-18 NOTE — Patient Care Conference (Signed)
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 Sula Soda Social Worker Present: Lavera Guise, BSW Nurse Present: Chana Bode, RN;Nathon Stefanski Marjo Bicker, RN PT Present: Raechel Chute, PT OT Present: Candee Furbish, OT SLP Present: Everardo Pacific, SLP PPS Coordinator present : Fae Pippin, SLP     Current Status/Progress Goal Weekly Team Focus  Bowel/Bladder   Pt is continent of bowel/bladder  *** Pt will remain continent of bowel/bladder   Will assess qshift and PRN    Swallow/Nutrition/ Hydration      ***         ADL's   Supervision UB, Min A LB, Min A toileting  *** Supervision   Barriers- cognition (memory, awareness), pain levels, poor sleep schedule (sleeping most of the day and awake at night), dynamic balance, limited assist at DC    Mobility   pt requires CGA throughout all transfers and gait w/ SPC, cues for cane management and increased step lenght.  *** supervision for transfers and gait w/ SPC? LRAD  improve balance w/ SPC, endurance    Communication   minA for communication of mildly complex thoughts  *** supervision   word finding, communication of mildly complex thoughts during conversation    Safety/Cognition/ Behavioral Observations  max-totalA overall for memory, maxA for basic problem  solving, maxA for orientation  *** supervision   orientation, problem solving, memory strategies, use of memory log    Pain   Pt denies pain  *** Pt will continue to deny pain   Will assess qshift and PRN    Skin   Pt's skin is intact  *** Pt's skin will remain intact  Will assess qshift and PRN      Discharge Planning:  Discharging homr with son to take some time off work and nephew to assist.   Team Discussion: *** Patient on target to meet rehab goals: {IP REHAB YES/NO WITH [id number]}  *See Care Plan and progress notes for long and short-term goals.   Revisions to Treatment Plan:  ***  Teaching Needs: ***  Current Barriers to Discharge: {BARRIERS TO [id number]:84696}  Possible Resolutions to Barriers: ***     Medical Summary Current Status: hip pain, back pain, knee pain, abraison to knee, incision to right groin, type 2 diabetes uncontrolled, uncontrolled hypertension, overweight  Barriers to Discharge: Medical stability  Barriers to Discharge Comments: hip pain, back pain, knee pain, abraison to knee, incision to right groin, type 2 diabetes uncontrolled, uncontrolled hypertension, overweight Possible Resolutions to [employer] Focus: tramadol scheduled, voltaren gel ordered, will order knee braces for him, continue to monitor incision and abraison, increase metformin   Continued Need for Acute Rehabilitation Level of Care: The patient requires daily medical management by a physician with specialized training in physical medicine and rehabilitation for the following reasons: Direction of a multidisciplinary physical rehabilitation program  to maximize functional independence : Yes Medical management of patient stability for increased activity during participation in an intensive rehabilitation regime.: Yes Analysis of laboratory values and/or radiology reports with any subsequent need for medication adjustment and/or medical intervention. : Yes   I  attest that I was present, lead the team conference, and concur with the assessment and plan of the team.   Jearld Adjutant 07/18/2023, 12:04 PM

## 2023-07-19 LAB — GLUCOSE, CAPILLARY
Glucose-Capillary: 157 mg/dL — ABNORMAL HIGH (ref 70–99)
Glucose-Capillary: 143 mg/dL — ABNORMAL HIGH (ref 70–99)
Glucose-Capillary: 178 mg/dL — ABNORMAL HIGH (ref 70–99)
Glucose-Capillary: 138 mg/dL — ABNORMAL HIGH (ref 70–99)

## 2023-07-19 MED ORDER — HYDROCODONE-ACETAMINOPHEN 5-325 MG PO TABS
1.0000 | ORAL_TABLET | Freq: Every day | ORAL | Status: DC
  Administered 2023-07-19 – 2023-07-25 (×7): 1 via ORAL
  Filled 2023-07-19 (×7): qty 1

## 2023-07-19 NOTE — Progress Notes (Signed)
Occupational Therapy Note  Patient Details  Name: Cory Brown MRN: [id number] Date of Birth: 19-Dec-1950  Today's Date: 07/19/2023 OT Missed Time: 30 Minutes Missed Time Reason: Other (comment) (eating lunch)  Pt eating lunch upon arrival. Pt requested to eat lunch before therapy. OTA checked back x 2 but pt still eating lunch. Pt missed 30 mins skilled OT services. Will attempt to see pt as schedule allows.   Lavone Neri [facility] 07/19/2023, 3:13 PM

## 2023-07-19 NOTE — Progress Notes (Signed)
Speech Language Pathology Weekly Progress and Session Note  Patient Details  Name: Cory Brown MRN: [id number] Date of Birth: 06-21-1951  Beginning of progress report period: July 12, 2023 End of progress report period: July 19, 2023  Today's Date: 07/19/2023 SLP Individual Time: 1400-1445 SLP Individual Time Calculation (min): 45 min  Short Term Goals: Week 1: SLP Short Term Goal 1 (Week 1): Patient will demonstrate orientation to person, place, time, and situation with minA and use of external aids SLP Short Term Goal 1 - Progress (Week 1): Not met SLP Short Term Goal 2 (Week 1): Patient will follow two-step functional directions with 80% accuracy given minA SLP Short Term Goal 2 - Progress (Week 1): Not met SLP Short Term Goal 3 (Week 1): Patient will communicate mildly complex thoughts and ideas in 4/5 opportunities utilizing provided expressive language strategies with modA SLP Short Term Goal 3 - Progress (Week 1): Met SLP Short Term Goal 4 (Week 1): Patient will recall basic daily events utilizing provided memory strategies as needed given modA SLP Short Term Goal 4 - Progress (Week 1): Not met SLP Short Term Goal 5 (Week 1): Patient will solve basic environmental problems in 4/5 opportunities given modA SLP Short Term Goal 5 - Progress (Week 1): Not met    New Short Term Goals: Week 2: SLP Short Term Goal 1 (Week 2): STGs=LTGs d/t ELOS  Weekly Progress Updates: Patient is making limited progress towards therapy goals this reporting period, meeting 1/5 short term goals set. Patient's significant cognitive impairments result in difficulty with skill carryover between sessions, thus patient currently max-totalA for all cognitive functions during basic functional tasks. Patient communicating mildly complex thoughts in 4/5 opportunities when given modA. Goals downgraded to reflect progress made thus far. Patient and family education ongoing. Patient will continue to benefit  from skilled therapy services during remainder of CIR stay.     Intensity: Minumum of 1-2 x/day, 30 to 90 minutes Frequency: 1 to 3 out of 7 days Duration/Length of Stay: 9/25 Treatment/Interventions: Cognitive remediation/compensation;Multimodal communication approach;Speech/Language facilitation;Cueing hierarchy;Functional tasks;Therapeutic Activities;Internal/external aids;Patient/family education   Daily Session  Skilled Therapeutic Interventions: SLP conducted skilled therapy session targeting cognitive retraining goals. Patient greeted upright in bed. During facilitated functional tasks, patient required maxA to recall and carry out two-step directions. With use of memory log and maxA, patient recalled events from the morning with PT. Patient continues to be disoriented to place, time, and situation, requiring maxA to utilize room aids provided several sessions prior despite frequent reeducation that they are there. Patient reporting 8/10 pain at end of session, eventually reporting that it had been hurting the entire time despite SLPs inquiry at beginning of the session. Suspect patient's disorientation to place limits insight to inquire for pain medications. Patient was left in lowered bed with call bell in reach and bed alarm set. SLP will continue to target goals per plan of care.     General    Pain Pain Assessment Pain Scale: 0-10 Pain Score: 8  Pain Location: Neck Pain Intervention(s): RN made aware  Therapy/Group: Individual Therapy  [name], M.A., CCC-SLP  [name] 07/19/2023, 4:33 PM

## 2023-07-19 NOTE — Progress Notes (Signed)
Physical Therapy Weekly Progress Note  Patient Details  Name: Cory Brown MRN: [id number] Date of Birth: Mar 22, 1951  Beginning of progress report period: July 12, 2023 End of progress report period: July 19, 2023  Patient has met 3 of 3 short term goals. Pt demonstrates slow progress towards long term goals. Pt is currently able to perform bed mobility with supervision using bed features, transfers with and without AD and CGA, ambulate >169ft with RW and/or SPC and CGA, and navigate 4 6in steps with bilateral handrails and CGA. Pt continues to be limited by pain in bilateral knees, back, and occasionally R hip, global weakness/deconditioning and cognitive impairments including poor memory, delayed processing, impaired motor planning/sequencing, decreased awareness, and problem solving deficits. Pt will require 24/7 supervision at discharge and will require family education training prior to discharge.   Patient continues to demonstrate the following deficits muscle weakness, decreased cardiorespiratoy endurance, impaired timing and sequencing, decreased coordination, and decreased motor planning, decreased awareness, decreased problem solving, decreased safety awareness, and decreased memory, and decreased standing balance, decreased postural control, and decreased balance strategies and therefore will continue to benefit from skilled PT intervention to increase functional independence with mobility.  Patient progressing toward long term goals..  Continue plan of care.  PT Short Term Goals Week 1:  PT Short Term Goal 1 (Week 1): Pt will ambulate 150 feet with LRAD and CGA PT Short Term Goal 1 - Progress (Week 1): Met PT Short Term Goal 2 (Week 1): Pt will perform bed mobility with CGA with LRAD PT Short Term Goal 2 - Progress (Week 1): Met PT Short Term Goal 3 (Week 1): pt will perform stand pivot transfer with CGA with LRAD PT Short Term Goal 3 - Progress (Week 1): Met Week 2:  PT  Short Term Goal 1 (Week 2): STG=LTG due to LOS  Skilled Therapeutic Interventions/Progress Updates:  Ambulation/gait training;Discharge planning;Functional mobility training;Psychosocial support;Therapeutic Activities;Visual/perceptual remediation/compensation;Balance/vestibular training;Disease management/prevention;Neuromuscular re-education;Skin care/wound management;Therapeutic Exercise;Wheelchair propulsion/positioning;Cognitive remediation/compensation;DME/adaptive equipment instruction;Pain management;Splinting/orthotics;UE/LE Strength taining/ROM;Community reintegration;Functional electrical stimulation;Patient/family education;Stair training;UE/LE Coordination activities   Therapy Documentation Precautions:  Precautions Precautions: Fall Precaution Comments: aphaisa, 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

## 2023-07-19 NOTE — Progress Notes (Signed)
PROGRESS NOTE   Subjective/Complaints: Found the knee braces helpful, discussed that I ordered them for home for him. Tobi Bastos notes pain is still significant with therapy, d/c tramadol and ordered Norco   ROS: +new urinary incontinence, +bilateral knee pain- continues to limit therapy, +red eyes, +chest pain occasionally, +hip pain   Objective:   No results found. No results for input(s): "WBC", "HGB", "HCT", "PLT" in the last 72 hours.   No results for input(s): "NA", "K", "CL", "CO2", "GLUCOSE", "BUN", "CREATININE", "CALCIUM" in the last 72 hours.   No intake or output data in the 24 hours ending 07/19/23 1144       Physical Exam: Vital Signs Blood pressure 128/72, pulse 70, temperature 98.9 F (37.2 C), temperature source Oral, resp. rate 16, height 6' (1.829 m), weight 86.9 kg, SpO2 96%. Gen: no distress, normal appearing, BMI 25.98 HEENT: oral mucosa pink and moist, NCAT Cardio: Rhythm irregular.  Chest: CTAB, normal effort, normal rate of breathing Abd: soft, non-distended, +BS Ext: no edema Psych: pleasant, flat affect  Skin:    General: Skin is warm and dry.     Findings: Bruising present.  Neurological:     Mental Status: He is alert.     Comments: Oriented to self and DOB. Mild right facial droop. He was unable to recall age, details leading to admission, unable to recall month, year, season or hospital. Expressive deficits--was unable to describe being in the hospital. He was able to follow simple motor commands.   Psych: pleasant GU: urinary incontinence MSK: TTP hip, lower back, knees   Assessment/Plan: 1. Functional deficits which require 3+ hours per day of interdisciplinary therapy in a comprehensive inpatient rehab setting. Physiatrist is providing close team supervision and 24 hour management of active medical problems listed below. Physiatrist and rehab team continue to assess barriers to  discharge/monitor patient progress toward functional and medical goals  Care Tool:  Bathing    Body parts bathed by patient: Right arm, Left arm, Chest, Abdomen, Front perineal area, Right upper leg, Left upper leg, Face, Buttocks, Right lower leg, Left lower leg   Body parts bathed by helper: Buttocks, Left lower leg, Right lower leg     Bathing assist Assist Level: Contact Guard/Touching assist     Upper Body Dressing/Undressing Upper body dressing   What is the patient wearing?: Pull over shirt    Upper body assist Assist Level: Set up assist    Lower Body Dressing/Undressing Lower body dressing      What is the patient wearing?: Underwear/pull up, Pants     Lower body assist Assist for lower body dressing: Minimal Assistance - Patient > 75%     Toileting Toileting    Toileting assist Assist for toileting: Minimal Assistance - Patient > 75%     Transfers Chair/bed transfer  Transfers assist     Chair/bed transfer assist level: Contact Guard/Touching assist     Locomotion Ambulation   Ambulation assist      Assist level: Contact Guard/Touching assist Assistive device: Walker-rolling Max distance: 371ft   Walk 10 feet activity   Assist     Assist level: Contact Guard/Touching assist Assistive device: Walker-rolling  Walk 50 feet activity   Assist    Assist level: Contact Guard/Touching assist Assistive device: Walker-rolling    Walk 150 feet activity   Assist Walk 150 feet activity did not occur: Safety/medical concerns  Assist level: Contact Guard/Touching assist Assistive device: Walker-rolling    Walk 10 feet on uneven surface  activity   Assist     Assist level: Supervision/Verbal cueing Assistive device: Walker-rolling   Wheelchair     Assist Is the patient using a wheelchair?: Yes Type of Wheelchair: Manual      Max wheelchair distance: 50    Wheelchair 50 feet with 2 turns activity    Assist         Assist Level: Moderate Assistance - Patient 50 - 74%   Wheelchair 150 feet activity     Assist      Assist Level: Maximal Assistance - Patient 25 - 49%   Blood pressure 128/72, pulse 70, temperature 98.9 F (37.2 C), temperature source Oral, resp. rate 16, height 6' (1.829 m), weight 86.9 kg, SpO2 96%.    Medical Problem List and Plan: 1. Functional deficits secondary to acute left internal carotid artery CVA             -patient may shower             -ELOS/Goals: 2-3 weeks days S             Continue CIR  Notes reviewed, ambulating 65 feet MinA with since point cane  -Interdisciplinary Team Conference today   2.  Antithrombotics: -DVT/anticoagulation:  Pharmaceutical: Other (comment)--Pradaxa             -antiplatelet therapy: N/A 3. Bilateral knee pain: Intolereant of tylenol. Norco scheduled daily before therapy 4. Mood/Behavior/Sleep: LCSW to follow for evaluation and support.              -antipsychotic agents: N/A             --will add melatonin prn for occasional insomnia 5. Neuropsych/cognition: This patient is not  capable of making decisions on his own behalf. 6. Skin/Wound Care: Routine pressure relief measures.  7. Fluids/Electrolytes/Nutrition:  Monitor I/O. Check CMET in am 8. A fib: Monitor HR TID--continue metoprolol and Pradaxa, increase magnesium to 500mg  HS  9. T2DM w/neuropathy: Hgb A1c-7.1. Will start monitoring  BS ac/hs.  --Use SSI for elevated BS. Monitor intake             --Was on jardiance, Amaryl and metformin PTA  -increase metformin to 500mg  BID   -9/16 CBGs controlled overall, continue current medications   10. Pre-renal azotemia: BUN trending up 26-->31. Encourage fluid intake. Placed nursing order to encourage 6-8 glasses of water per day  -9/16 BUN a little improved at 24, creatinine 1.06.  Continue to encourage oral p.o. intake   11. Chronic LBP/neck as well as hip, knee, shoulder: continue Voltaren gel added knees and right  shoulder             --monitor for symptoms with increase in activity.             Kpad ordered  Will discuss tens unit   12. Suboptimal vitamin D: ergocalciferol 50,000U once per week started, continue  13. HTN: start magnesium supplement 250mg  HS, level reviewed and is stable at 2, start propanolol 10mg  daily     07/19/2023    5:52 AM 07/18/2023    3:25 PM 07/18/2023    5:10 AM  Vitals with BMI  Systolic 128 132 161  Diastolic 72 74 70  Pulse 70 71 72     14. Bilateral knee pain: neoprene knee sleeves ordered, discussed with patient and he finds these helpful, continue voltaren gel. Knee braces ordered, tramadol scheduled  15. Urinary continence: continue bladder program to take patient to bathroom q2H while awake  16. Red eyes: continue clear eyes, discussed improvement  17. Indigestion: d/c maalox since was not helpful  18. Chest pain: continue prn nitrolgycerin. EKG reviewed and negative for infarct  19. Essential tremor: continue propanolol 10mg  daily     LOS: 8 days A FACE TO FACE EVALUATION WAS PERFORMED  Clint Bolder P Micaella Gitto 07/19/2023, 11:44 AM

## 2023-07-19 NOTE — Progress Notes (Signed)
Physical Therapy Session Note  Patient Details  Name: Cory Brown MRN: [id number] Date of Birth: 1951/05/19  Today's Date: 07/19/2023 PT Individual Time: 9528-4132 and 1031-1058 PT Individual Time Calculation (min): 71 min and 27 min PT Missed Time: 33 minutes PT Missed Time Reason: Pain  Short Term Goals: Week 1:  PT Short Term Goal 1 (Week 1): Pt will ambulate 150 feet with LRAD and CGA PT Short Term Goal 1 - Progress (Week 1): Met PT Short Term Goal 2 (Week 1): Pt will perform bed mobility with CGA with LRAD PT Short Term Goal 2 - Progress (Week 1): Met PT Short Term Goal 3 (Week 1): pt will perform stand pivot transfer with CGA with LRAD PT Short Term Goal 3 - Progress (Week 1): Met Week 2:  PT Short Term Goal 1 (Week 2): STG=LTG due to LOS  Skilled Therapeutic Interventions/Progress Updates:  Treatment Session 1 Received pt semi-reclined in bed asleep. Upon wakening, pt agreeable to PT treatment and reported pain was "okay" and denied any medication or interventions. Session with emphasis on functional mobility/transfers, generalized strengthening and endurance, dynamic standing balance/coordination, NMR, simulated car transfers, and gait training. Pt transferred semi-reclined<>sitting L EOB with HOB elevated and use of bedrails with supervision and increased time/effort. Donned shoes with set up assist and increased time and transferred bed<>WC stand<>pivot without AD and min A. Pt sat in WC at sink and washed face/brushed teeth with set up assist.    Pt transported to/from room in [facility] dependently for time management purposes. Stood from [facility] with RW and CGA and performed ambulatory simulated car transfer with RW and supervision with cues to avoid reaching out for mobile doorframe. Pt then ambulated 29ft on uneven surfaces (ramp) with RW and very close supervision. Pt worked on the following activities on BITS with emphasis on R attention, problem solving, fine motor control, and standing  tolerance: - shape replication x 2 trials - pt with increased difficulty replicating hexagon, drawing circle instead - trail making part A - standing for 12 minutes and 32 seconds with 136 errors. Pt able to start assessment without any cues but required mod increasing to max cues with fatigue.  Pt then ambulated ~324ft with RW and CGA/close supervision back to room. Pt ambulates at decreased cadence with narrow BOS and short/shuffling steps - required cues to avoid kicking RW with R foot and to increase R foot clearance with fatigue. Concluded session with pt sitting in recliner, needs within reach, and seatbelt alarm on. K-pad placed on back for pain relief and memory notebook updated.   Treatment Session 2 Received pt sitting in recliner reporting "not doing good" and "everything they've done ain't done no good". When asked what pt needed, pt unable to explain/elaborate stating "I don't know". Upon further questioning, pt revealed pain in his knees was his primary complaint - per RN pt already received scheduled pain medication - therefore notified MD. Pt politely refusing to go to gym, declined need to use restroom, or offer to go outside for change in environmental stimuli. Pt verbalized feeling uncomfortable and with encouragement, agreed to try returning to bed to reposition.    Pt transferred recliner<>bed stand<>pivot without AD and min A and sit<>supine with supervision. Placed pillows under knees for comfort and applied topical gel and heat packs to bilateral knees for pain relief. Concluded session with pt semi-reclined in bed, needs within reach, and bed alarm on. 33 minutes missed of skilled physical therapy due to pain.  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

## 2023-07-19 NOTE — Progress Notes (Addendum)
Patient ID: Cory Brown, male   DOB: 20-Sep-1951, 72 y.o.   MRN: [id number]    [facility] referral sent to [facility].    Request for [facility] services and DME emailed to [facility].   9/19:  Patient approved with [facility]. Morrie Sheldon with [facility] to start Auth with [facility] for [facility] services.

## 2023-07-20 LAB — GLUCOSE, CAPILLARY
Glucose-Capillary: 142 mg/dL — ABNORMAL HIGH (ref 70–99)
Glucose-Capillary: 138 mg/dL — ABNORMAL HIGH (ref 70–99)
Glucose-Capillary: 88 mg/dL (ref 70–99)
Glucose-Capillary: 118 mg/dL — ABNORMAL HIGH (ref 70–99)

## 2023-07-20 NOTE — Progress Notes (Signed)
Occupational Therapy Session Note  Patient Details  Name: Cory Brown MRN: [id number] Date of Birth: [birth year]  Today's Date: 07/20/2023 OT Individual Time: 7829-5621 OT Individual Time Calculation (min): 40 min    Short Term Goals: Week 2:  OT Short Term Goal 1 (Week 2): STG = LTG due to ELOS  Skilled Therapeutic Interventions/Progress Updates:  Skilled OT intervention completed with focus on pain management, dynamic balance, activity tolerance. Pt received seated in w/c stating "I am miserable.Peggye Form been sitting in this chair for hours" with pt noted to be sitting without w/c cushion. Pt agreeable to session. 7/10 pain reported in both knees and buttocks; pre-medicated and not due for any more. Per nursing, OT applied voltaren gel to both knees as well as offered rest breaks and repositioning throughout for pain reduction.  Transported dependently in w/c <> gym for energy conservation. CGA sit > stand and ambulatory transfer with RW > EOM with pt able to maintain stance for about a min for pressure relief.   Pt participated in the following dynamic standing balance and endurance tasks to promote independence and safety during BADLs and functional mobility: -cornhole toss activity. CGA sit > stand without AD, and close supervision/CGA needed for balance during activity. No LOB, however cues needed to step forward to not compensate with back of knees on mat for balance. Seated rest breaks provided for fatigue. Utilized RUE then LUE with mild weakness noted in RUE however proprioceptive awareness has improved.  Seated, for RUE NMR, pt donned gloves with min A then wiped down all bean bags. CGA sit > stand and stand pivot without AD > w/c, then in room, stand pivot > recliner.  Pt remained seated in recliner with BLE elevated, with heating pad on both knees, belt alarm on/activated, and with all needs in reach at end of session.   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

## 2023-07-20 NOTE — Progress Notes (Signed)
Physical Therapy Session Note  Patient Details  Name: Cory Brown MRN: [id number] Date of Birth: [birth year]  Today's Date: 07/20/2023 PT Individual Time: 1415-1455 PT Individual Time Calculation (min): 40 min   Short Term Goals: Week 1:  PT Short Term Goal 1 (Week 1): Pt will ambulate 150 feet with LRAD and CGA PT Short Term Goal 1 - Progress (Week 1): Met PT Short Term Goal 2 (Week 1): Pt will perform bed mobility with CGA with LRAD PT Short Term Goal 2 - Progress (Week 1): Met PT Short Term Goal 3 (Week 1): pt will perform stand pivot transfer with CGA with LRAD PT Short Term Goal 3 - Progress (Week 1): Met Week 2:  PT Short Term Goal 1 (Week 2): STG=LTG due to LOS  Skilled Therapeutic Interventions/Progress Updates:   Received pt sitting in recliner, pt agreeable to PT treatment, and reported pain was "50/50". Session with emphasis on functional mobility/transfers, generalized strengthening and endurance, and gait training. Pt performed all transfers with RW and CGA throughout session. Pt ambulated ~1103ft x 2 trials with RW and CGA/close supervision to/from dayroom. Pt ambulates at decreased cadence with narrow BOS and short/shuffling steps - cues to take "bigger" steps but poor carryover. Pt performed seated BUE/BLE strengthening on Nustep at workload 3 for 8 minutes for a total of 171 steps with emphasis on cardiovascular endurance. While working on [employer], assessed cognition - pt unable to recall reason for hospital admission and when asked where he was stated "here to get some help", ultimately requiring max cues for reorientation. Pt unable to recall D/C date and discussed that pt will require assist from son and possibly grandson upon discharge. Concluded session with pt sitting in recliner, needs within reach, and seatbelt alarm on.   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

## 2023-07-20 NOTE — Progress Notes (Signed)
Occupational Therapy Session Note  Patient Details  Name: Cory Brown MRN: [id number] Date of Birth: [birth year]  Today's Date: 07/20/2023 OT Individual Time: 2130-8657 OT Individual Time Calculation (min): 62 min    Short Term Goals: Week 2:  OT Short Term Goal 1 (Week 2): STG = LTG due to ELOS  Skilled Therapeutic Interventions/Progress Updates:  Pt greeted supine in bed, pt agreeable to OT intervention. Session focus on BADL reeducation, functional mobility, dynamic standing balance, R sided attention and decreasing overall caregiver burden.          CGA for supine>sit, pt completed ambulatory transfer to bathroom with RW and CGA. MIN verbal cues needed for sequencing when stepping in shower. Pt completed bathing with overall MIN A both sitting and standing. MIN verbal cues needed in shower as pt intially unaware that he hadn't squeezed out any soap and noted to begin bathing without any water.   Pt exited shower via stand pivot to wc with MIN A. Pt completed dressing from w/c with MIN A to orient shirt. Pt donned pants needing MIN A to thread RLE d/t RUE   Handed pt his shoes with pt able to don L shoe/sock but stated  " I need something to pull up on" unaware he was holding his shoe in R hand, noted tremors in RUE, pt reports baseline.        Pt transported to gym in w/c with total A for time mgmt. Pt instructed to reach out of BOS to BITS to tap stimulus on screen to work on visual scanning to R side. Pt completed task with 53% accuracy, slowest in R quadrant, 2.65 sec reaction time.   Pt completed additional visual scanning to task on BITS with pt instructed to sequence through letters to tap letters from A-Z. Assist with recall of letters after E.    Ended session with pt seated in w/c with all needs within reach and safety belt alarm activated.                    Therapy Documentation Precautions:  Precautions Precautions: Fall Precaution Comments: aphaisa, delay in  processing, R hemi Restrictions Weight Bearing Restrictions: No    Pain: no pain reported during session     Therapy/Group: Individual Therapy  Pollyann Glen [facility] 07/20/2023, 12:16 PM

## 2023-07-20 NOTE — Progress Notes (Signed)
Speech Language Pathology Daily Session Note  Patient Details  Name: Cory Brown MRN: [id number] Date of Birth: [birth year]  Today's Date: 07/20/2023 SLP Individual Time: 1005-1100 SLP Individual Time Calculation (min): 55 min  Short Term Goals: Week 2: SLP Short Term Goal 1 (Week 2): STGs=LTGs d/t ELOS  Skilled Therapeutic Interventions: SLP conducted skilled therapy session targeting cognitive retraining goals. Patient greeted upright in wheelchair at bedside. Patient continues to be disoriented to place, time, and situation. SLP session focused on use of external aids and facilitating orientation to aforementioned information. Patient required reorientation every 2-3 minutes and in one instance asked "what day is it?" Three times in one minute. Patient's memory deficits are severely impacting carryover between therapy sessions with limited progress observed to this point. Patient continues to require totalA for all basic cognitive functions but does demonstrate some awareness to cognitive deficits, stating frequently that his "brain's just not quite right." Utilizing errorless learning, patient eventually oriented to date, time, and broad situation, although will assess carryover at next session. Patient was left in chair with call bell in reach and chair alarm set. SLP will continue to target goals per plan of care.       Pain Pain Assessment Pain Scale: 0-10 Pain Score: 5  Pain Location: Abdomen  Therapy/Group: Individual Therapy  [name], M.A., CCC-SLP  [name] 07/20/2023, 12:52 PM

## 2023-07-20 NOTE — Progress Notes (Signed)
PROGRESS NOTE   Subjective/Complaints: No new complaints this morning Showering with OT Patient's chart reviewed- No issues reported overnight Vitals signs stable    ROS: +new urinary incontinence, +bilateral knee pain- continues to limit therapy, +red eyes, +chest pain occasionally, +hip pain- continues   Objective:   No results found. No results for input(s): "WBC", "HGB", "HCT", "PLT" in the last 72 hours.   No results for input(s): "NA", "K", "CL", "CO2", "GLUCOSE", "BUN", "CREATININE", "CALCIUM" in the last 72 hours.    Intake/Output Summary (Last 24 hours) at 07/20/2023 1035 Last data filed at 07/20/2023 0905 Gross per 24 hour  Intake 240 ml  Output --  Net 240 ml         Physical Exam: Vital Signs Blood pressure 136/84, pulse 92, temperature 98.2 F (36.8 C), resp. rate 19, height 6' (1.829 m), weight 86.9 kg, SpO2 97%. Gen: no distress, normal appearing, BMI 25.98 HEENT: oral mucosa pink and moist, NCAT Cardio: Rhythm irregular.  Chest: normal effort, normal rate of breathing Abd: soft, non-distended Ext: no edema Psych: pleasant, normal affect Skin:    General: Skin is warm and dry.     Findings: Bruising present.  Neurological:     Mental Status: He is alert.     Comments: Oriented to self and DOB. Mild right facial droop. He was unable to recall age, details leading to admission, unable to recall month, year, season or hospital. Expressive deficits--was unable to describe being in the hospital. He was able to follow simple motor commands.   Psych: pleasant GU: urinary incontinence MSK: TTP hip, lower back, knees   Assessment/Plan: 1. Functional deficits which require 3+ hours per day of interdisciplinary therapy in a comprehensive inpatient rehab setting. Physiatrist is providing close team supervision and 24 hour management of active medical problems listed below. Physiatrist and rehab team  continue to assess barriers to discharge/monitor patient progress toward functional and medical goals  Care Tool:  Bathing    Body parts bathed by patient: Right arm, Left arm, Chest, Abdomen, Front perineal area, Right upper leg, Left upper leg, Face, Buttocks, Right lower leg, Left lower leg   Body parts bathed by helper: Buttocks, Left lower leg, Right lower leg     Bathing assist Assist Level: Contact Guard/Touching assist     Upper Body Dressing/Undressing Upper body dressing   What is the patient wearing?: Pull over shirt    Upper body assist Assist Level: Set up assist    Lower Body Dressing/Undressing Lower body dressing      What is the patient wearing?: Underwear/pull up, Pants     Lower body assist Assist for lower body dressing: Minimal Assistance - Patient > 75%     Toileting Toileting    Toileting assist Assist for toileting: Minimal Assistance - Patient > 75%     Transfers Chair/bed transfer  Transfers assist     Chair/bed transfer assist level: Contact Guard/Touching assist     Locomotion Ambulation   Ambulation assist      Assist level: Contact Guard/Touching assist Assistive device: Walker-rolling Max distance: 346ft   Walk 10 feet activity   Assist     Assist level:  Contact Guard/Touching assist Assistive device: Walker-rolling   Walk 50 feet activity   Assist    Assist level: Contact Guard/Touching assist Assistive device: Walker-rolling    Walk 150 feet activity   Assist Walk 150 feet activity did not occur: Safety/medical concerns  Assist level: Contact Guard/Touching assist Assistive device: Walker-rolling    Walk 10 feet on uneven surface  activity   Assist     Assist level: Supervision/Verbal cueing Assistive device: Walker-rolling   Wheelchair     Assist Is the patient using a wheelchair?: Yes Type of Wheelchair: Manual      Max wheelchair distance: 50    Wheelchair 50 feet with 2  turns activity    Assist        Assist Level: Moderate Assistance - Patient 50 - 74%   Wheelchair 150 feet activity     Assist      Assist Level: Maximal Assistance - Patient 25 - 49%   Blood pressure 136/84, pulse 92, temperature 98.2 F (36.8 C), resp. rate 19, height 6' (1.829 m), weight 86.9 kg, SpO2 97%.    Medical Problem List and Plan: 1. Functional deficits secondary to acute left internal carotid artery CVA             -patient may shower             -ELOS/Goals: 2-3 weeks days S             Continue CIR  Notes reviewed, ambulating 65 feet MinA with since point cane  -Interdisciplinary Team Conference today   2.  Antithrombotics: -DVT/anticoagulation:  Pharmaceutical: Other (comment)--Pradaxa             -antiplatelet therapy: N/A 3. Bilateral knee pain: Intolereant of tylenol. Norco scheduled daily before therapy 4. Mood/Behavior/Sleep: LCSW to follow for evaluation and support.              -antipsychotic agents: N/A             --will add melatonin prn for occasional insomnia 5. Neuropsych/cognition: This patient is not  capable of making decisions on his own behalf. 6. Skin/Wound Care: Routine pressure relief measures.  7. Fluids/Electrolytes/Nutrition:  Monitor I/O. Check CMET in am 8. A fib: Monitor HR TID--continue metoprolol and Pradaxa, increase magnesium to 500mg  HS  9. T2DM w/neuropathy: Hgb A1c-7.1. Will start monitoring  BS ac/hs.  --Use SSI for elevated BS. Monitor intake             --Was on jardiance, Amaryl and metformin PTA  -increase metformin to 500mg  BID   -9/16 CBGs controlled overall, continue current medications   10. Pre-renal azotemia: BUN trending up 26-->31. Encourage fluid intake. Placed nursing order to encourage 6-8 glasses of water per day  -9/16 BUN a little improved at 24, creatinine 1.06.  Continue to encourage oral p.o. intake   11. Chronic LBP/neck as well as hip, knee, shoulder: continue Voltaren gel added knees and  right shoulder             --monitor for symptoms with increase in activity.             Kpad ordered  Will discuss tens unit   12. Suboptimal vitamin D: ergocalciferol 50,000U once per week started, continue  13. HTN: start magnesium supplement 250mg  HS, level reviewed and is stable at 2, start propanolol 10mg  daily     07/20/2023    5:25 AM 07/19/2023    9:22 PM 07/19/2023    2:22  PM  Vitals with BMI  Systolic 136 131 409  Diastolic 84 83 60  Pulse 92 70 70     14. Bilateral knee pain: neoprene knee sleeves ordered, discussed with patient and he finds these helpful, continue voltaren gel. Zynex knee braces ordered, Norco scheduled daily  15. Urinary continence: continue bladder program to take patient to bathroom q2H while awake  16. Red eyes: continue clear eyes, discussed improvement  17. Indigestion: d/c maalox since was not helpful, discussed eating smaller meals  18. Chest pain: continue prn nitrolgycerin. EKG reviewed and negative for infarct, troponin reviewed and is stable.   19. Essential tremor: continue propanolol 10mg  daily     LOS: 9 days A FACE TO FACE EVALUATION WAS PERFORMED  Clint Bolder P Brinley Rosete 07/20/2023, 10:35 AM

## 2023-07-21 LAB — GLUCOSE, CAPILLARY
Glucose-Capillary: 113 mg/dL — ABNORMAL HIGH (ref 70–99)
Glucose-Capillary: 123 mg/dL — ABNORMAL HIGH (ref 70–99)
Glucose-Capillary: 127 mg/dL — ABNORMAL HIGH (ref 70–99)
Glucose-Capillary: 173 mg/dL — ABNORMAL HIGH (ref 70–99)

## 2023-07-21 MED ORDER — HYDROCODONE-ACETAMINOPHEN 5-325 MG PO TABS
1.0000 | ORAL_TABLET | Freq: Three times a day (TID) | ORAL | Status: DC | PRN
  Filled 2023-07-21: qty 1

## 2023-07-21 NOTE — Progress Notes (Signed)
Physical Therapy Session Note  Patient Details  Name: Cory Brown MRN: [id number] Date of Birth: December 24, 1950  Today's Date: 07/21/2023 PT Individual Time: 7253-6644 PT Individual Time Calculation (min): 42 min   Short Term Goals: Week 1:  PT Short Term Goal 1 (Week 1): Pt will ambulate 150 feet with LRAD and CGA PT Short Term Goal 1 - Progress (Week 1): Met PT Short Term Goal 2 (Week 1): Pt will perform bed mobility with CGA with LRAD PT Short Term Goal 2 - Progress (Week 1): Met PT Short Term Goal 3 (Week 1): pt will perform stand pivot transfer with CGA with LRAD PT Short Term Goal 3 - Progress (Week 1): Met Week 2:  PT Short Term Goal 1 (Week 2): STG=LTG due to LOS  Skilled Therapeutic Interventions/Progress Updates:   Received pt sitting in WC in ADL apartment with son and daughter in law present for education training - handoff from OT. Pt agreeable to PT treatment and reported pain was "okay". Session with emphasis on discharge planning, functional mobility/transfers, generalized strengthening and endurance, simulated car transfers, and gait training.  Pt performed all transfers with RW and CGA/close supervision throughout session. Pt ambulated 60ft with RW and CGA fading to supervision to ortho gym - pt's son reports shuffling gait pattern has been present since February. Pt performed simulated car transfer with RW and CGA with cues for technique and to avoid holding onto mobile doorframe. Pt then ambulated 84ft on uneven surfaces (ramp) with RW and close supervision. Pt's family reports pt has ramp to enter home, but they are getting it redone due to steepness. Pt transported back to room in [facility] dependently for energy conservation purposes.   Educated pt's family on the following: - recommendation to use RW due to pain and safety due to cognitive impairments - need for 27/7 supervision upon discharge - importance of remaining on pt's R side (weak side) in case of LOB  Pt's son with  numerous questions regarding speech, memory, and pain medications - deferred to MD/PA and SLP. Concluded session with pt sitting in [facility] with all needs within reach. Cleared pt's family to assist with transfers and gait.   Therapy Documentation Precautions:  Precautions Precautions: Fall Precaution Comments: aphaisa, delay in processing, R hemi Restrictions Weight Bearing Restrictions: No  Therapy/Group: Individual Therapy Marlana Salvage Zaunegger Blima Rich PT, DPT 07/21/2023, 6:46 AM

## 2023-07-21 NOTE — Progress Notes (Addendum)
PROGRESS NOTE   Subjective/Complaints:  Pt reports feeling OK- wants ot go home, but knows not quite ready.  Has regular "aches and pains".  LBM yesterday.     ROS: +new urinary incontinence, +bilateral knee pain- continues to limit therapy, +red eyes, +chest pain occasionally, +hip pain- continues   Pt denies SOB, abd pain, CP, N/V/C/D, and vision changes   Objective:   No results found. No results for input(s): "WBC", "HGB", "HCT", "PLT" in the last 72 hours.   No results for input(s): "NA", "K", "CL", "CO2", "GLUCOSE", "BUN", "CREATININE", "CALCIUM" in the last 72 hours.    Intake/Output Summary (Last 24 hours) at 07/21/2023 0955 Last data filed at 07/21/2023 8119 Gross per 24 hour  Intake 596 ml  Output --  Net 596 ml         Physical Exam: Vital Signs Blood pressure 110/68, pulse 69, temperature 99.7 F (37.6 C), temperature source Oral, resp. rate 20, height 6' (1.829 m), weight 86.9 kg, SpO2 96%.    General: awake, alert, appropriate,just woke up- sitting up in bed;  NAD HENT: conjugate gaze; oropharynx moist CV: irregular rhythm, rate controlled rate; no JVD Pulmonary: CTA B/L; no W/R/R- good air movement GI: soft, NT, ND, (+)BS Psychiatric: appropriate Neurological: alert  Skin:    General: Skin is warm and dry.     Findings: Bruising present.  Neurological:     Mental Status: He is alert.     Comments: Oriented to self and DOB. Mild right facial droop. He was unable to recall age, details leading to admission, unable to recall month, year, season or hospital. Expressive deficits--was unable to describe being in the hospital. He was able to follow simple motor commands.   Psych: pleasant GU: urinary incontinence MSK: TTP hip, lower back, knees   Assessment/Plan: 1. Functional deficits which require 3+ hours per day of interdisciplinary therapy in a comprehensive inpatient rehab  setting. Physiatrist is providing close team supervision and 24 hour management of active medical problems listed below. Physiatrist and rehab team continue to assess barriers to discharge/monitor patient progress toward functional and medical goals  Care Tool:  Bathing    Body parts bathed by patient: Right arm, Left arm, Chest, Abdomen, Front perineal area, Right upper leg, Left upper leg, Face, Buttocks, Right lower leg, Left lower leg   Body parts bathed by helper: Buttocks, Left lower leg, Right lower leg     Bathing assist Assist Level: Contact Guard/Touching assist     Upper Body Dressing/Undressing Upper body dressing   What is the patient wearing?: Pull over shirt    Upper body assist Assist Level: Set up assist    Lower Body Dressing/Undressing Lower body dressing      What is the patient wearing?: Underwear/pull up, Pants     Lower body assist Assist for lower body dressing: Minimal Assistance - Patient > 75%     Toileting Toileting    Toileting assist Assist for toileting: Minimal Assistance - Patient > 75%     Transfers Chair/bed transfer  Transfers assist     Chair/bed transfer assist level: Contact Guard/Touching assist     Locomotion Ambulation   Ambulation  assist      Assist level: Contact Guard/Touching assist Assistive device: Walker-rolling Max distance: 330ft   Walk 10 feet activity   Assist     Assist level: Contact Guard/Touching assist Assistive device: Walker-rolling   Walk 50 feet activity   Assist    Assist level: Contact Guard/Touching assist Assistive device: Walker-rolling    Walk 150 feet activity   Assist Walk 150 feet activity did not occur: Safety/medical concerns  Assist level: Contact Guard/Touching assist Assistive device: Walker-rolling    Walk 10 feet on uneven surface  activity   Assist     Assist level: Supervision/Verbal cueing Assistive device: Walker-rolling    Wheelchair     Assist Is the patient using a wheelchair?: Yes Type of Wheelchair: Manual      Max wheelchair distance: 50    Wheelchair 50 feet with 2 turns activity    Assist        Assist Level: Moderate Assistance - Patient 50 - 74%   Wheelchair 150 feet activity     Assist      Assist Level: Maximal Assistance - Patient 25 - 49%   Blood pressure 110/68, pulse 69, temperature 99.7 F (37.6 C), temperature source Oral, resp. rate 20, height 6' (1.829 m), weight 86.9 kg, SpO2 96%.    Medical Problem List and Plan: 1. Functional deficits secondary to acute left internal carotid artery CVA             -patient may shower             -ELOS/Goals: 2-3 weeks days S             Con't CIR PT, OT and SLP  Notes reviewed, ambulating 65 feet MinA with since point cane   2.  Antithrombotics: -DVT/anticoagulation:  Pharmaceutical: Other (comment)--Pradaxa             -antiplatelet therapy: N/A 3. Bilateral knee pain: Intolereant of tylenol. Norco scheduled daily before therapy 4. Mood/Behavior/Sleep: LCSW to follow for evaluation and support.              -antipsychotic agents: N/A             --will add melatonin prn for occasional insomnia 5. Neuropsych/cognition: This patient is not  capable of making decisions on his own behalf. 6. Skin/Wound Care: Routine pressure relief measures.  7. Fluids/Electrolytes/Nutrition:  Monitor I/O. Check CMET in am 8. A fib: Monitor HR TID--continue metoprolol and Pradaxa, increase magnesium to 500mg  HS  9. T2DM w/neuropathy: Hgb A1c-7.1. Will start monitoring  BS ac/hs.  --Use SSI for elevated BS. Monitor intake             --Was on jardiance, Amaryl and metformin PTA  -increase metformin to 500mg  BID   -9/16 CBGs controlled overall, continue current medications  9/21- CBGs 88-142- controlled- con't regimen  10. Pre-renal azotemia: BUN trending up 26-->31. Encourage fluid intake. Placed nursing order to encourage 6-8  glasses of water per day  -9/16 BUN a little improved at 24, creatinine 1.06.  Continue to encourage oral p.o. intake   11. Chronic LBP/neck as well as hip, knee, shoulder: continue Voltaren gel added knees and right shoulder             --monitor for symptoms with increase in activity.             Kpad ordered  Will discuss tens unit   12. Suboptimal vitamin D: ergocalciferol 50,000U once per week started, continue  13. HTN: start magnesium supplement 250mg  HS, level reviewed and is stable at 2, start propanolol 10mg  daily  9/21- BP controlled- con't regimen     07/21/2023    8:08 AM 07/21/2023    3:15 AM 07/20/2023    7:29 PM  Vitals with BMI  Systolic 110 158 161  Diastolic 68 95 65  Pulse 69 88 70     14. Bilateral knee pain: neoprene knee sleeves ordered, discussed with patient and he finds these helpful, continue voltaren gel. Zynex knee braces ordered, Norco scheduled daily  15. Urinary continence: continue bladder program to take patient to bathroom q2H while awake  9/21- per nursing, working OK- con't regimen 16. Red eyes: continue clear eyes, discussed improvement  17. Indigestion: d/c maalox since was not helpful, discussed eating smaller meals  18. Chest pain: continue prn nitrolgycerin. EKG reviewed and negative for infarct, troponin reviewed and is stable.   19. Essential tremor: continue propanolol 10mg  daily    Addendum - called that pt's pain is not being controlled by current regimen- will add Norco 5/325 mg q8 hours in addition to scheduled Norco-     LOS: 10 days A FACE TO FACE EVALUATION WAS PERFORMED  Jayde Daffin 07/21/2023, 9:55 AM

## 2023-07-21 NOTE — Progress Notes (Signed)
Speech Language Pathology Daily Session Note  Patient Details  Name: Cory Brown MRN: [id number] Date of Birth: Mar 18, 1951  Today's Date: 07/21/2023 SLP Individual Time: 1115-1200 SLP Individual Time Calculation (min): 45 min  Short Term Goals: Week 2: SLP Short Term Goal 1 (Week 2): STGs=LTGs d/t ELOS  Skilled Therapeutic Interventions:  Patient was seen for skilled ST services targeting patient and family education.  Patient was joined by his son and daughter-in-law.  SLP provided education on aphasia and cognitive communication impairment.  Handouts were provided.  Patient's son reports overwhelm and concerns for when they return home.  SLP explained the process and that a case manager will answer any questions they have before the discharge.  SLP explained the importance of assisting patient in managing all iADLs, specifically medications and bills.  SLP explained that the speech therapist at the next level of care will continue education to ensure they feel confident in assisting with management of Cory Brown's cognitive and linguistic impairments.  Family had no other questions at this time.   Patient was left in room with family, all immediate needs within reach, and safety measures activated. Cont with current ST POC.   Pain Pain Assessment Pain Score: 0-No pain  Therapy/Group: Individual Therapy  Ann Lions 07/21/2023, 12:58 PM

## 2023-07-21 NOTE — Plan of Care (Signed)
  Problem: Consults Goal: RH STROKE PATIENT EDUCATION Description: See Patient Education module for education specifics  Outcome: Progressing   Problem: RH BOWEL ELIMINATION Goal: RH STG MANAGE BOWEL WITH ASSISTANCE Description: STG Manage Bowel with min/mod Assistance. Outcome: Progressing Goal: RH STG MANAGE BOWEL W/MEDICATION W/ASSISTANCE Description: STG Manage Bowel with Medication with min Assistance. Outcome: Progressing   Problem: RH BLADDER ELIMINATION Goal: RH STG MANAGE BLADDER WITH ASSISTANCE Description: STG Manage Bladder With min/mod Assistance Outcome: Progressing   Problem: RH SKIN INTEGRITY Goal: RH STG SKIN FREE OF INFECTION/BREAKDOWN Description: Incision to right groin will continue to heal and be free of infection/breakdown with min assist  Outcome: Progressing Goal: RH STG MAINTAIN SKIN INTEGRITY WITH ASSISTANCE Description: STG Maintain Skin Integrity With min Assistance. Outcome: Progressing Goal: RH STG ABLE TO PERFORM INCISION/WOUND CARE W/ASSISTANCE Description: STG Able To Perform Incision/Wound Care With min Assistance. Outcome: Progressing   Problem: RH SAFETY Goal: RH STG ADHERE TO SAFETY PRECAUTIONS W/ASSISTANCE/DEVICE Description: STG Adhere to Safety Precautions With min Assistance/Device. Outcome: Progressing Goal: RH STG DECREASED RISK OF FALL WITH ASSISTANCE Description: STG Decreased Risk of Fall With min Assistance. Outcome: Progressing   Problem: RH COGNITION-NURSING Goal: RH STG USES MEMORY AIDS/STRATEGIES W/ASSIST TO PROBLEM SOLVE Description: STG Uses Memory Aids/Strategies With cueing Assistance to Problem Solve. Outcome: Progressing Goal: RH STG ANTICIPATES NEEDS/CALLS FOR ASSIST W/ASSIST/CUES Description: STG Anticipates Needs/Calls for Assist With min Assistance/Cues. Outcome: Progressing   Problem: RH PAIN MANAGEMENT Goal: RH STG PAIN MANAGED AT OR BELOW PT'S PAIN GOAL Description: Less than 4 with PRN medications min  assist Outcome: Progressing   Problem: RH KNOWLEDGE DEFICIT Goal: RH STG INCREASE KNOWLEDGE OF DIABETES Description: Patient will be able to manage medications, diet/lifestyle modifications, and check blood sugars to improve A1c from nursing education, nursing handouts and other resources independently  Outcome: Progressing Goal: RH STG INCREASE KNOWLEDGE OF HYPERTENSION Description: Patient/caregiver will be able to manage blood pressure medications and lifestyle modifications to improve overall blood pressure from nursing education and nursing handouts independently   Outcome: Progressing Goal: RH STG INCREASE KNOWLEGDE OF HYPERLIPIDEMIA Description: Patient/caregiver will be able to manage cholesterol levels and improve HDL from nursing education and nursing handouts independently  Outcome: Progressing Goal: RH STG INCREASE KNOWLEDGE OF STROKE PROPHYLAXIS Description: Patient/caregiver will be able to manage stroke medications from nursing education and nursing handouts independently  Outcome: Progressing

## 2023-07-21 NOTE — Progress Notes (Signed)
Occupational Therapy Session Note  Patient Details  Name: ESTIVEN PHILLIPE MRN: [id number] Date of Birth: [birth year]  Today's Date: 07/21/2023 OT Individual Time: [phone] OT Individual Time Calculation (min): 43 min    Short Term Goals: Week 2:  OT Short Term Goal 1 (Week 2): STG = LTG due to ELOS  Skilled Therapeutic Interventions/Progress Updates:  Skilled OT intervention completed with focus on family education with son present. Pt received upright in bed, agreeable to session. No pain reported.  OT provided education on the following in prep for DC: -Recommended 24/7 supervision for all mobility at the ambulatory level with RW and supervision/min A for ADLs due to cognitive deficits and frequency of LOB -Confirmed bathroom set up, with son reporting tub/shower but plans to have [facility] assist with bathroom remodel for walk in shower for longer term needs. Advised that TTB would be safest considering CLOF, environmental set up and pt's balance/cognitive deficits; CSW notified -Demonstrated use of shower curtain tucked under bottom during use of TTB for preventing water spillage -Energy conservation strategies- lukewarm water, proper ventilation via fan or cracked door to reduce steam, minimizing stands in shower especially during hair washing with eyes closed, and planning ahead for shower tasks to be rested/have rest time following in case of fatigue -Reviewed use of BSC at bedside to reduce fall risk at night when pt is less alert or for urgency; family reports pt already has one at home; CSW notified of the change -Reviewed use of BSC over top of toilet for raising the height or for use of push up rails to stand  OT assisted pt in demonstrating the following during session: -Supervision sit > stands and ambulatory transfers during session using RW including from EOB > chair at sink > w/c and with tub/shower transfer with TTB -Min A donning of UB shirt, supervision UB bathing, Min A LB  dressing at sink side  Son assisted with the following: -bed mobility at [facility] [address] level -sit > stands and ambulatory transfers with RW at supervision level -supervision for tub/shower transfer using TTB   Son reports that pt is close to baseline for mobility, ever since the previous stroke and has had the intention tremors and shuffling gait. Pt with concern about parkinsonism traits with OT agreeing, and advising that he be re-evaluated by a physician. 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   Pt remained seated in w/c with direct care handoff to PT in ADL apartment at end of session.   Therapy Documentation Precautions:  Precautions Precautions: Fall Precaution Comments: aphaisa, delay in processing, R hemi Restrictions Weight Bearing Restrictions: No    Therapy/Group: Individual Therapy  [name], MS, OTR/L  07/21/2023, 10:32 AM

## 2023-07-22 LAB — GLUCOSE, CAPILLARY
Glucose-Capillary: 136 mg/dL — ABNORMAL HIGH (ref 70–99)
Glucose-Capillary: 166 mg/dL — ABNORMAL HIGH (ref 70–99)
Glucose-Capillary: 103 mg/dL — ABNORMAL HIGH (ref 70–99)
Glucose-Capillary: 140 mg/dL — ABNORMAL HIGH (ref 70–99)

## 2023-07-22 NOTE — Progress Notes (Signed)
Occupational Therapy Session Note  Patient Details  Name: Cory Brown MRN: [id number] Date of Birth: 03-30-1951  {CHL IP REHAB OT TIME CALCULATIONS:[id number]}   Short Term Goals: Week 2:  OT Short Term Goal 1 (Week 2): STG = LTG due to ELOS  Skilled Therapeutic Interventions/Progress Updates:    Patient agreeable to participate in OT session. Reports *** pain level.   Patient participated in skilled OT session focusing on ***. Therapist facilitated/assessed/developed/educated/integrated/elicited *** in order to improve/facilitate/promote    Therapy Documentation Precautions:  Precautions Precautions: Fall Precaution Comments: aphaisa, delay in processing, R hemi Restrictions Weight Bearing Restrictions: No  Therapy/Group: Individual Therapy  Limmie Patricia, OTR/L,CBIS  Supplemental OT - [facility] and [facility] Secure Chat Preferred   07/22/2023, 6:36 PM

## 2023-07-22 NOTE — Plan of Care (Signed)
  Problem: Consults Goal: RH STROKE PATIENT EDUCATION Description: See Patient Education module for education specifics  Outcome: Progressing   Problem: RH BOWEL ELIMINATION Goal: RH STG MANAGE BOWEL WITH ASSISTANCE Description: STG Manage Bowel with min/mod Assistance. Outcome: Progressing Goal: RH STG MANAGE BOWEL W/MEDICATION W/ASSISTANCE Description: STG Manage Bowel with Medication with min Assistance. Outcome: Progressing   Problem: RH BLADDER ELIMINATION Goal: RH STG MANAGE BLADDER WITH ASSISTANCE Description: STG Manage Bladder With min/mod Assistance Outcome: Progressing   Problem: RH SKIN INTEGRITY Goal: RH STG SKIN FREE OF INFECTION/BREAKDOWN Description: Incision to right groin will continue to heal and be free of infection/breakdown with min assist  Outcome: Progressing Goal: RH STG MAINTAIN SKIN INTEGRITY WITH ASSISTANCE Description: STG Maintain Skin Integrity With min Assistance. Outcome: Progressing   Problem: RH SAFETY Goal: RH STG ADHERE TO SAFETY PRECAUTIONS W/ASSISTANCE/DEVICE Description: STG Adhere to Safety Precautions With min Assistance/Device. Outcome: Progressing Goal: RH STG DECREASED RISK OF FALL WITH ASSISTANCE Description: STG Decreased Risk of Fall With min Assistance. Outcome: Progressing   Problem: RH COGNITION-NURSING Goal: RH STG USES MEMORY AIDS/STRATEGIES W/ASSIST TO PROBLEM SOLVE Description: STG Uses Memory Aids/Strategies With cueing Assistance to Problem Solve. Outcome: Progressing Goal: RH STG ANTICIPATES NEEDS/CALLS FOR ASSIST W/ASSIST/CUES Description: STG Anticipates Needs/Calls for Assist With min Assistance/Cues. Outcome: Progressing   Problem: RH PAIN MANAGEMENT Goal: RH STG PAIN MANAGED AT OR BELOW PT'S PAIN GOAL Description: Less than 4 with PRN medications min assist Outcome: Progressing   Problem: RH KNOWLEDGE DEFICIT Goal: RH STG INCREASE KNOWLEDGE OF DIABETES Description: Patient will be able to manage  medications, diet/lifestyle modifications, and check blood sugars to improve A1c from nursing education, nursing handouts and other resources independently  Outcome: Progressing Goal: RH STG INCREASE KNOWLEDGE OF HYPERTENSION Description: Patient/caregiver will be able to manage blood pressure medications and lifestyle modifications to improve overall blood pressure from nursing education and nursing handouts independently   Outcome: Progressing Goal: RH STG INCREASE KNOWLEGDE OF HYPERLIPIDEMIA Description: Patient/caregiver will be able to manage cholesterol levels and improve HDL from nursing education and nursing handouts independently  Outcome: Progressing Goal: RH STG INCREASE KNOWLEDGE OF STROKE PROPHYLAXIS Description: Patient/caregiver will be able to manage stroke medications from nursing education and nursing handouts independently  Outcome: Progressing

## 2023-07-22 NOTE — Progress Notes (Signed)
PROGRESS NOTE   Subjective/Complaints:  Pt reports wants ot wash hair- doesn't remember when, but feels like it's been "too long".   Also doesn't remember LBM.  Not hurting "too bad"- ~5/10 this AM- without any meds.  Family education done yesterday  ROS:   Pt denies SOB, abd pain, CP, N/V/C/D, and vision changes    Objective:   No results found. No results for input(s): "WBC", "HGB", "HCT", "PLT" in the last 72 hours.   No results for input(s): "NA", "K", "CL", "CO2", "GLUCOSE", "BUN", "CREATININE", "CALCIUM" in the last 72 hours.    Intake/Output Summary (Last 24 hours) at 07/22/2023 1048 Last data filed at 07/22/2023 0751 Gross per 24 hour  Intake 478 ml  Output --  Net 478 ml         Physical Exam: Vital Signs Blood pressure (!) 137/90, pulse 91, temperature 98.7 F (37.1 C), temperature source Oral, resp. rate 18, height 6' (1.829 m), weight 86.9 kg, SpO2 99%.      General: awake, alert, appropriate, sitting up in bed;  NAD HENT: conjugate gaze; oropharynx moist CV: regular rate ; no JVD Pulmonary: CTA B/L; no W/R/R- good air movement GI: soft, NT, ND, (+)BS Psychiatric: appropriate- brighter affect-  Neurological: poor STM Skin:    General: Skin is warm and dry.     Findings: Bruising present.  Neurological:     Mental Status: He is alert.     Comments: Oriented to self and DOB. Mild right facial droop. He was unable to recall age, details leading to admission, unable to recall month, year, season or hospital. Expressive deficits--was unable to describe being in the hospital. He was able to follow simple motor commands.   Psych: pleasant GU: urinary incontinence MSK: TTP hip, lower back, knees   Assessment/Plan: 1. Functional deficits which require 3+ hours per day of interdisciplinary therapy in a comprehensive inpatient rehab setting. Physiatrist is providing close team supervision and 24  hour management of active medical problems listed below. Physiatrist and rehab team continue to assess barriers to discharge/monitor patient progress toward functional and medical goals  Care Tool:  Bathing    Body parts bathed by patient: Right arm, Left arm, Chest, Abdomen, Front perineal area, Right upper leg, Left upper leg, Face, Buttocks, Right lower leg, Left lower leg   Body parts bathed by helper: Buttocks, Left lower leg, Right lower leg     Bathing assist Assist Level: Contact Guard/Touching assist     Upper Body Dressing/Undressing Upper body dressing   What is the patient wearing?: Pull over shirt    Upper body assist Assist Level: Set up assist    Lower Body Dressing/Undressing Lower body dressing      What is the patient wearing?: Underwear/pull up, Pants     Lower body assist Assist for lower body dressing: Minimal Assistance - Patient > 75%     Toileting Toileting    Toileting assist Assist for toileting: Minimal Assistance - Patient > 75%     Transfers Chair/bed transfer  Transfers assist     Chair/bed transfer assist level: Contact Guard/Touching assist     Locomotion Ambulation   Ambulation assist  Assist level: Contact Guard/Touching assist Assistive device: Walker-rolling Max distance: 360ft   Walk 10 feet activity   Assist     Assist level: Contact Guard/Touching assist Assistive device: Walker-rolling   Walk 50 feet activity   Assist    Assist level: Contact Guard/Touching assist Assistive device: Walker-rolling    Walk 150 feet activity   Assist Walk 150 feet activity did not occur: Safety/medical concerns  Assist level: Contact Guard/Touching assist Assistive device: Walker-rolling    Walk 10 feet on uneven surface  activity   Assist     Assist level: Supervision/Verbal cueing Assistive device: Walker-rolling   Wheelchair     Assist Is the patient using a wheelchair?: Yes Type of  Wheelchair: Manual      Max wheelchair distance: 50    Wheelchair 50 feet with 2 turns activity    Assist        Assist Level: Moderate Assistance - Patient 50 - 74%   Wheelchair 150 feet activity     Assist      Assist Level: Maximal Assistance - Patient 25 - 49%   Blood pressure (!) 137/90, pulse 91, temperature 98.7 F (37.1 C), temperature source Oral, resp. rate 18, height 6' (1.829 m), weight 86.9 kg, SpO2 99%.    Medical Problem List and Plan: 1. Functional deficits secondary to acute left internal carotid artery CVA             -patient may shower             -ELOS/Goals: 2-3 weeks days S          Con't CIR PT, OT and SLP  Family education done yesterday   2.  Antithrombotics: -DVT/anticoagulation:  Pharmaceutical: Other (comment)--Pradaxa             -antiplatelet therapy: N/A 3. Bilateral knee pain: Intolereant of tylenol. Norco scheduled daily before therapy  9/22- changed to Norco q8 hours prn- for pain- since was already on 1x/day- might be worth giving Tramadol? 4. Mood/Behavior/Sleep: LCSW to follow for evaluation and support.              -antipsychotic agents: N/A             --will add melatonin prn for occasional insomnia 5. Neuropsych/cognition: This patient is not  capable of making decisions on his own behalf. 6. Skin/Wound Care: Routine pressure relief measures.  7. Fluids/Electrolytes/Nutrition:  Monitor I/O. Check CMET in am 8. A fib: Monitor HR TID--continue metoprolol and Pradaxa, increase magnesium to 500mg  HS  9. T2DM w/neuropathy: Hgb A1c-7.1. Will start monitoring  BS ac/hs.  --Use SSI for elevated BS. Monitor intake             --Was on jardiance, Amaryl and metformin PTA  -increase metformin to 500mg  BID   -9/16 CBGs controlled overall, continue current medications  9/21-9/22 CBGs 88-142- controlled- con't regimen  10. Pre-renal azotemia: BUN trending up 26-->31. Encourage fluid intake. Placed nursing order to encourage 6-8  glasses of water per day  -9/16 BUN a little improved at 24, creatinine 1.06.  Continue to encourage oral p.o. intake   11. Chronic LBP/neck as well as hip, knee, shoulder: continue Voltaren gel added knees and right shoulder             --monitor for symptoms with increase in activity.             Kpad ordered  Will discuss tens unit   12. Suboptimal vitamin D: ergocalciferol  50,000U once per week started, continue  13. HTN: start magnesium supplement 250mg  HS, level reviewed and is stable at 2, start propanolol 10mg  daily  9/21-9/22 BP controlled- con't regimen     07/22/2023    3:00 AM 07/21/2023    7:18 PM 07/21/2023    5:24 PM  Vitals with BMI  Systolic 137 125 657  Diastolic 90 83 76  Pulse 91 70 78     14. Bilateral knee pain: neoprene knee sleeves ordered, discussed with patient and he finds these helpful, continue voltaren gel. Zynex knee braces ordered, Norco scheduled daily  9/22- ordered Norco q8 hours prn 15. Urinary continence: continue bladder program to take patient to bathroom q2H while awake  9/21- per nursing, working OK- con't regimen 16. Red eyes: continue clear eyes, discussed improvement  17. Indigestion: d/c maalox since was not helpful, discussed eating smaller meals  18. Chest pain: continue prn nitrolgycerin. EKG reviewed and negative for infarct, troponin reviewed and is stable.   19. Essential tremor: continue propanolol 10mg  daily    Addendum -9/21 called that pt's pain is not being controlled by current regimen- will add Norco 5/325 mg q8 hours in addition to scheduled Norco-     LOS: 11 days A FACE TO FACE EVALUATION WAS PERFORMED  Cory Brown 07/22/2023, 10:48 AM

## 2023-07-23 DIAGNOSIS — F321 Major depressive disorder, single episode, moderate: Secondary | ICD-10-CM

## 2023-07-23 LAB — GLUCOSE, CAPILLARY
Glucose-Capillary: 124 mg/dL — ABNORMAL HIGH (ref 70–99)
Glucose-Capillary: 155 mg/dL — ABNORMAL HIGH (ref 70–99)
Glucose-Capillary: 109 mg/dL — ABNORMAL HIGH (ref 70–99)
Glucose-Capillary: 122 mg/dL — ABNORMAL HIGH (ref 70–99)

## 2023-07-23 LAB — BASIC METABOLIC PANEL
Anion gap: 8 (ref 5–15)
BUN: 31 mg/dL — ABNORMAL HIGH (ref 8–23)
CO2: 24 mmol/L (ref 22–32)
Calcium: 9.1 mg/dL (ref 8.9–10.3)
Chloride: 103 mmol/L (ref 98–111)
Creatinine, Ser: 1.16 mg/dL (ref 0.61–1.24)
GFR, Estimated: >60 mL/min (ref >60)
Glucose, Bld: 134 mg/dL — ABNORMAL HIGH (ref 70–99)
Potassium: 3.9 mmol/L (ref 3.5–5.1)
Sodium: 135 mmol/L (ref 135–145)

## 2023-07-23 LAB — CBC
HCT: 45 % (ref 39.0–52.0)
Hemoglobin: 16 g/dL (ref 13.0–17.0)
MCH: 33.5 pg (ref 26.0–34.0)
MCHC: 35.6 g/dL (ref 30.0–36.0)
MCV: 94.1 fL (ref 80.0–100.0)
Platelets: 188 10*3/uL (ref 150–400)
RBC: 4.78 MIL/uL (ref 4.22–5.81)
RDW: 12.6 % (ref 11.5–15.5)
WBC: 5.6 10*3/uL (ref 4.0–10.5)
nRBC: 0 % (ref 0.0–0.2)

## 2023-07-23 NOTE — Consult Note (Signed)
Neuropsychological Consultation Comprehensive Inpatient Rehab   Patient:   Cory Brown   DOB:   [birth year]  MR Number:  [id number]  Location:  [facility] [address] [place] [address] Dept: [phone] Loc: [phone]           Date of Service:   07/23/2023  Start Time:   8 AM End Time:   9 AM  Provider/Observer:  Arley Phenix, Psy.D.       Clinical Neuropsychologist       Billing Code/Service: [phone]  Reason for Service:    Cory Brown is a 72 year old male with past history of left MCA stroke with residual mild right lower extremity weakness.  Patient also has a past history of chronic low back pain/neck pain, CAD, PPM, A-fib.  Patient was admitted on 07/08/2023 after being found down by neighbor with noted right-sided weakness, right facial droop and slurred speech.  Imaging indicated left ICA occluded at origin of thrombus and moderate narrowing mid P2 segment of the right PCA with perfusion abnormality in the region of the left basal ganglia.  Patient underwent mechanical thrombectomy of left ICA with complete recanalization.  Follow-up MRI revealed scattered acute infarct throughout left MCA and bilateral ACA territories.  Chronic right frontal, left MCA and left basal ganglia infarcts and chronic left thalamic hemorrhage were noted.  Patient was admitted onto CIR due to functional deficits including expressive language deficits including anomia and delayed/reduced information processing speed.  Patient also limited by right sided weakness along with these cognitive deficits.  Patient was awake and alert but was not oriented only knowing he  was in hospital.  And limited awareness he had a stroke.  Patient with very flat affect.  Stating "it does not really matter" multiple times.  Patient not able to describe much anything that had been going on medically or who his doctors were or what medical care  he had been getting.  Information processing was slow.  Patient able to express self, but very little detail, likely coping due to orientation and awareness.  Noted flat affect and depressed mood with little self directed engagement.  Was able to follow questions and provide limited answers.  Patient denies treatment for depression but does admit that he has really had loss of function and has no motivation and anhedonia.    HPI for the current admission:    HPI: Cory Brown. Licea is a 72 year old RH-male with history of L-MCA stroke w/residual mild RLE weakness, chronic LBP/neck pain, CAD s/p PTCA,  PPM, 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 He was found to have right sided weakness, right facial droop and slurred speech. UDS negative.  CTA head/neck with perfusion showed left ICA occluded at origin with thrombus except tending to level of ICA terminus, moderate narrowing mid P2 segment of right PCA and perfusion abnormality in the region of the left basal ganglia.  He underwent cerebral angio with mechanical thrombectomy of left ICA with complete recanalization by Dr. Lennox Laity to [name].  Follow-up MRI/MRI brain revealed scattered foci of acute infarct throughout the left MCA and bilateral ACA territories, no acute hemorrhage and no LVO. Chronic right frontal, left MCA and left basal ganglia infarcts and chronic left thalamic hemorrhage. 2D echo showed EF 60-65 % with mild concentric LVH, severe dilatation of b-atria and mild to moderate TVR. Dr. Pearlean Brownie  felt that stroke was due to large vessel disease and recommended continuing Pradaxa as unknown compliance with medication. Patient with multiple strokes and hx of non compliance.     Right knee x-rays done due to pain revealing mild to moderate medial compartment OA and small joint effusion with calcification proximal medial collateral ligament likely due to remote injury.  Right shoulder x-ray showed  moderate glenohumeral and acromioclavicular osteoarthritis no change compared to 03/24. 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 Therapy has been working with patient who is limited by right sided weakness and cognitive deficits. CIR recommended due to functional decline. Reports blurred vision.   Medical History:   Past Medical History:  Diagnosis Date   Acute bronchitis, unspecified 01/16/2022   Acute ischemic stroke (HCC) 12/05/2021   Arthritis    Atrial fibrillation (HCC)    Carpal tunnel syndrome 01/16/2022   Cerebral infarction, unspecified (HCC) 01/16/2022   Cerebrovascular accident [facility]) 01/16/2022   Dec 27, 2021 Entered By: [facility] D Comment: Left MCA stroke s/p thrombectomy, 12/05/2021.   Chronic atrial fibrillation (HCC) 09/17/2021   Coronary artery disease 01/16/2022   Dec 27, 2021 Entered By: [facility] D Comment: S/p stent Obtuse marginal 2, 10/2013.   Counseling, unspecified 01/16/2022   Diabetes mellitus without complication (HCC)    Essential (primary) hypertension 01/16/2022   Generalized weakness 09/17/2021   Hypercholesterolemia    Hyperglycemia due to diabetes mellitus (HCC) 09/17/2021   Hyperlipidemia 01/16/2022   Hypertension    Hyponatremia 09/17/2021   Kidney stones    Low back pain 01/16/2022   MI (myocardial infarction) (HCC)    Muscle weakness (generalized) 01/16/2022   Neck pain 01/16/2022   Non-traumatic rhabdomyolysis 09/19/2021   Special screening for malignant neoplasms, colon 11/30/2009   May 05, 2010 Entered By: [facility] D Comment: Normal per pt   Stroke (cerebrum) (HCC) 12/05/2021   Stroke (HCC) 2010   Thrombocytopenia (HCC) 09/18/2021   Traumatic rhabdomyolysis (HCC) 09/17/2021   Tremor    Ulnar neuropathy 01/16/2022         Patient Active Problem List   Diagnosis Date Noted   Current moderate episode of major depressive disorder (HCC) 07/23/2023   Essential tremor 07/11/2023   Diabetic neuropathy (HCC) 07/11/2023    Acute ischemic left internal carotid artery (ICA) stroke (HCC) 07/11/2023   History of recent stroke 02/22/2022   Arthritis 01/16/2022   DM (diabetes mellitus), type 2 with complications (HCC) 01/16/2022   Hypercholesterolemia 01/16/2022   Essential (primary) hypertension 01/16/2022   Kidney stones 01/16/2022   MI (myocardial infarction) (HCC) 01/16/2022   Tremor 01/16/2022   Acute bronchitis, unspecified 01/16/2022   Carpal tunnel syndrome 01/16/2022   Coronary artery disease 01/16/2022   Counseling, unspecified 01/16/2022   Low back pain 01/16/2022   Muscle weakness (generalized) 01/16/2022   Neck pain 01/16/2022   Ulnar neuropathy 01/16/2022   Cerebrovascular accident (HCC) 01/16/2022   Cerebral infarction, unspecified (HCC) 01/16/2022   Hyperlipidemia 01/16/2022   Acute ischemic stroke (HCC) 12/05/2021   Stroke (cerebrum) (HCC) 12/05/2021   Non-traumatic rhabdomyolysis 09/19/2021   Thrombocytopenia (HCC) 09/18/2021   Chronic atrial fibrillation (HCC) 09/17/2021   Generalized weakness 09/17/2021   Hyperglycemia due to diabetes mellitus (HCC) 09/17/2021   Hyponatremia 09/17/2021   Traumatic rhabdomyolysis (HCC) 09/17/2021   Special screening for malignant neoplasms, colon 11/30/2009   Stroke (HCC) 2010    Behavioral Observation/Mental Status:   BALIAN CHAUDOIN  presents as a 72 y.o.-year-old Right handed Caucasian Male who appeared his  stated age. his dress was Appropriate and he was Well Groomed and his manners were Appropriate to the situation.  his participation was indicative of Inattentive and Redirectable behaviors.  There were physical disabilities noted.  he displayed an appropriate level of cooperation and motivation.    Interactions:    Active Inattentive  Attention:   abnormal and attention span appeared shorter than expected for age  Memory:   abnormal; global memory impairment noted  Visuo-spatial:   not examined  Speech (Volume):  low  Speech:   normal;  normal  Thought Process:  Circumstantial  Coherent  Though Content:  WNL; not suicidal and not homicidal  Orientation:   person and situation  Judgment:   Poor  Planning:   Poor  Affect:    Blunted, Flat, and Lethargic  Mood:    Dysphoric  Insight:   Shallow  Intelligence:   normal  Psychiatric History:  No prior psychiatric history but [facility] with [facility].   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  Patient also has a past history of chronic low back pain/neck pain, CAD, PPM, A-fib.  Patient was admitted on 07/08/2023 after being found down by neighbor with noted right-sided weakness, right facial droop and slurred speech.  Imaging indicated left ICA occluded at origin of thrombus and moderate narrowing mid P2 segment of the right PCA with perfusion abnormality in the region of the left basal ganglia.  Patient underwent mechanical thrombectomy of left ICA with complete recanalization.  Follow-up MRI revealed scattered acute infarct throughout left MCA and bilateral ACA territories.  Chronic right frontal, left MCA and left basal ganglia infarcts and chronic left thalamic hemorrhage were noted.  Patient was admitted onto CIR due to functional deficits including expressive language deficits including anomia and delayed/reduced information processing speed.  Patient also limited by right sided weakness along with these cognitive deficits.  Patient was awake and alert but was not oriented only knowing he  was in hospital.  And limited awareness he had a stroke.  Patient with very flat affect.  Stating "it does not really matter" multiple  times.  Patient not able to describe much anything that had been going on medically or who his doctors were or what medical care he had been getting.  Information processing was slow.  Patient able to express self, but very little detail, likely coping due to orientation and awareness.  Noted flat affect and depressed mood with little self directed engagement.  Was able to follow questions and provide limited answers.  Patient denies treatment for depression but does admit that he has really had loss of function and has no motivation and anhedonia.    Disposition/Plan:  Patient does appear depressed with flat affect and lack of self directed activity.  Would be worth a trial of an SSRI if not contraindicated by other medical issues.  Diagnosis:    Depression with limited history information         Electronically Signed   _______________________ Arley Phenix, Psy.D. Clinical Neuropsychologist

## 2023-07-23 NOTE — Progress Notes (Signed)
Physical Therapy Session Note  Patient Details  Name: Cory Brown MRN: [id number] Date of Birth: Aug 06, 1951  Today's Date: 07/23/2023 PT Individual Time: 1300-1408 PT Individual Time Calculation (min): 68 min  Today's Date: 07/23/2023 PT Missed Time: 7 Minutes Missed Time Reason: Toileting  Short Term Goals: Week 1:  PT Short Term Goal 1 (Week 1): Pt will ambulate 150 feet with LRAD and CGA PT Short Term Goal 1 - Progress (Week 1): Met PT Short Term Goal 2 (Week 1): Pt will perform bed mobility with CGA with LRAD PT Short Term Goal 2 - Progress (Week 1): Met PT Short Term Goal 3 (Week 1): pt will perform stand pivot transfer with CGA with LRAD PT Short Term Goal 3 - Progress (Week 1): Met Week 2:  PT Short Term Goal 1 (Week 2): STG=LTG due to LOS  Skilled Therapeutic Interventions/Progress Updates:   Received pt sitting in recliner, pt agreeable to PT treatment, and reported pain 3/10 in bilateral knees - RN notified and present to apply Voltaren. Session with emphasis on discharge planning, functional mobility/transfers, generalized strengthening and endurance, dynamic standing balance/coordination, gait training, and stair navigation.   Went through sensation, MMT, and pain interference questionnaire. Pt performed all transfers with RW and CGA/close supervision throughout session. Pt ambulated 68ft with RW and CGA to WC, then transported to main therapy gym in [facility] dependently for time management purposes. Pt navigated 12 6in steps with bilateral handrails and CGA/close supervision ascending and descending with a step to pattern.   Pt able to stand with RW and supervision and pick up tissue box from floor with RW and CGA with cues for technique. Pt then ambulated 113ft with RW and CGA to hold pants up, otherwise supervision. Pt ambulates at decreased cadence with narrow BOS and short, shuffling steps. Added 1lb ankle weight to RLE and pt ambulated additional 159ft with RW and  CGA/supervision - noted improvements in R foot clearance.   Worked on dynamic standing balance tapping 4 colored circles placed on 3in step with CGA/min A for balance x 2 trials - pt correct 8/10 attempts on trial 1 and 9/10 attempts on trial 2. Pt able to recognize colors for the most part, but had difficulty naming colors, confusing purple for grey and red for brown.   Pt ambulated 158ft with RW and CGA/supervisin back to room, then reported urge to toilet. Ambulated 33ft with RW and close supervision into bathroom and able to manage clothing without assist. Concluded session with pt sitting on toilet left in care of RN. 7 minutes missed of skilled physical therapy due to toileting.   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

## 2023-07-23 NOTE — Progress Notes (Signed)
Speech Language Pathology Daily Session Note  Patient Details  Name: Cory Brown MRN: [id number] Date of Birth: Mar 16, 1951  Today's Date: 07/23/2023 SLP Individual Time: 0900-1000 SLP Individual Time Calculation (min): 60 min  Short Term Goals: Week 2: SLP Short Term Goal 1 (Week 2): STGs=LTGs d/t ELOS  Skilled Therapeutic Interventions: SLP conducted skilled therapy session targeting cognitive retraining and communication goals. Patient overall improving across cognitive tasks, though requires maxA overall for basic cognitive functions. Patient oriented to general place with modI (!!!), identifying that we are in a hospital rather than a school. Patient better oriented to time, utilizing wall calendar to identify correct month and year with minA. Guessed date was 24th vs 23rd, requiring orientation from SLP. Required maxA to identify day of the week and total-maxA to orient to situation. SLP and patient utilized therapy schedule to discuss previous and future daily events with patient benefiting from mod-maxA to interpret. During memory log task, patient assisted by recalling date and previous/upcoming therapy appointments this date. SLP then reoriented patient to the benefit of utilizing memory log to recall daily information. This session, patient noticeably aphasic, with mostly empty speech, requiring mod-maxA to increase content in utterances when communicating mildly complex thoughts. Patient was left in lowered bed with call bell in reach and bed alarm set. SLP will continue to target goals per plan of care.       Pain Pain Assessment Pain Scale: 0-10 Pain Score: 3  Pain Location: Chest ("lungs")  Therapy/Group: Individual Therapy  Cory Brown, M.A., CCC-SLP  Cory Brown 07/23/2023, 9:55 AM

## 2023-07-23 NOTE — Progress Notes (Signed)
Patient ID: Cory Brown, male   DOB: Jul 02, 1951, 72 y.o.   MRN: [id number]  New recommendations for TTB. Order sent to [facility]. TTB will not be in place before discharge.

## 2023-07-23 NOTE — Progress Notes (Signed)
Physical Therapy Discharge Summary  Patient Details  Name: Cory Brown MRN: [id number] Date of Birth: 11-08-1950  Date of Discharge from PT service:July 24, 2023  Patient has met 9 of 9 long term goals due to improved activity tolerance, improved balance, improved postural control, increased strength, and improved coordination.  Patient to discharge at an ambulatory level Supervision. Patient's care partner is independent to provide the necessary physical and cognitive assistance at discharge. Pt's son and daughter in law attended family education training on 9/21 and verbalized and demonstrated confidence with tasks to ensure safe discharge home.   All goals met   Recommendation:  Patient will benefit from ongoing skilled PT services in outpatient setting to continue to advance safe functional mobility, address ongoing impairments in functional mobility/transfers, generalized strengthening and endurance, dynamic standing balance/coordination, gait training, problem solving, and minimize to fall risk.  Equipment: RW  Reasons for discharge: treatment goals met and discharge from hospital  Patient/family agrees with progress made and goals achieved: Yes  PT Discharge Precautions/Restrictions Precautions Precautions: Fall Precaution Comments: mild R hemi Restrictions Weight Bearing Restrictions: No Pain Interference Pain Interference Pain Effect on Sleep: 2. Occasionally Pain Interference with Therapy Activities: 1. Rarely or not at all Pain Interference with Day-to-Day Activities: 1. Rarely or not at all Cognition Overall Cognitive Status: Impaired/Different from baseline Arousal/Alertness: Awake/alert Orientation Level: Oriented to person;Oriented to place Day of Week: Incorrect Attention: Sustained;Selective Sustained Attention: Impaired Sustained Attention Impairment: Functional basic;Verbal basic Selective Attention: Impaired Selective Attention Impairment:  Functional basic;Verbal basic Memory: Impaired Memory Impairment: Storage deficit;Retrieval deficit;Decreased recall of new information;Decreased short term memory Decreased Short Term Memory: Verbal basic;Functional basic Awareness: Impaired Awareness Impairment: Intellectual impairment;Emergent impairment;Anticipatory impairment Problem Solving: Impaired Problem Solving Impairment: Functional basic;Verbal basic Executive Function: Self Monitoring;Self Correcting Self Monitoring: Impaired Self Monitoring Impairment: Verbal basic;Functional basic Self Correcting: Impaired Self Correcting Impairment: Verbal basic;Functional basic Safety/Judgment: Impaired Sensation Sensation Light Touch: Impaired by gross assessment Light Touch Impaired Details: Impaired RLE Hot/Cold: Not tested Proprioception: Appears Intact Stereognosis: Not tested Additional Comments: decreased sensation along RLE Coordination Gross Motor Movements are Fluid and Coordinated: No Fine Motor Movements are Fluid and Coordinated: No Coordination and Movement Description: R hemi, global weakness/decnditioning, pain, cognitive impairments, impaired motor planning/sequencing Finger Nose Finger Test: slow R>L Heel Shin Test: decreased ROM and speed bilaterally Motor  Motor Motor: Hemiplegia Motor - Skilled Clinical Observations: R hemi, global weakness/decnditioning, pain, cognitive impairments, impaired motor planning/sequencing  Mobility Bed Mobility Bed Mobility: Rolling Right;Rolling Left;Sit to Supine;Supine to Sit Rolling Right: Supervision/verbal cueing Rolling Left: Supervision/Verbal cueing Supine to Sit: Supervision/Verbal cueing Sit to Supine: Supervision/Verbal cueing Transfers Transfers: Sit to Stand;Stand to Sit;Stand Pivot Transfers Sit to Stand: Supervision/Verbal cueing Stand to Sit: Supervision/Verbal cueing Stand Pivot Transfers: Supervision/Verbal cueing Transfer (Assistive device): Rolling  walker Locomotion  Gait Ambulation: Yes Gait Assistance: Supervision/Verbal cueing Gait Distance (Feet): 180 Feet Assistive device: Rolling walker Gait Assistance Details: Verbal cues for gait pattern Gait Assistance Details: verbal cues to increase R foot clearance Gait Gait: Yes Gait Pattern: Impaired Gait Pattern: Step-to pattern;Shuffle;Decreased stride length;Decreased step length - right;Decreased step length - left;Lateral trunk lean to right;Trunk flexed;Narrow base of support;Poor foot clearance - right;Poor foot clearance - left Gait velocity: decreased Stairs / Additional Locomotion Stairs: Yes Stairs Assistance: Supervision/Verbal cueing Stair Management Technique: Two rails Number of Stairs: 12 Height of Stairs: 6 Ramp: Supervision/Verbal cueing (RW) Wheelchair Mobility Wheelchair Mobility: No  Trunk/Postural Assessment  Cervical Assessment Cervical Assessment: Exceptions to [facility] (forward  head) Thoracic Assessment Thoracic Assessment: Exceptions to [facility] (thoracic rounding) Lumbar Assessment Lumbar Assessment: Exceptions to [facility] (posterior pelvic tilt) Postural Control Postural Control: Deficits on evaluation Righting Reactions: slightly delayed on R Protective Responses: slightly delayed on R  Balance Balance Balance Assessed: Yes Standardized Balance Assessment Standardized Balance Assessment: Berg Balance Test Berg Balance Test Sit to Stand: Able to stand  independently using hands Standing Unsupported: Able to stand safely 2 minutes Sitting with Back Unsupported but Feet Supported on Floor or Stool: Able to sit safely and securely 2 minutes Stand to Sit: Controls descent by using hands Transfers: Able to transfer with verbal cueing and /or supervision Standing Unsupported with Eyes Closed: Able to stand 10 seconds safely Standing Ubsupported with Feet Together: Able to place feet together independently and stand for 1 minute with supervision From Standing,  Reach Forward with Outstretched Arm: Can reach forward >5 cm safely (2") From Standing Position, Pick up Object from Floor: Able to pick up shoe, needs supervision From Standing Position, Turn to Look Behind Over each Shoulder: Turn sideways only but maintains balance Turn 360 Degrees: Needs close supervision or verbal cueing Standing Unsupported, Alternately Place Feet on Step/Stool: Able to complete >2 steps/needs minimal assist Standing Unsupported, One Foot in Front: Loses balance while stepping or standing Standing on One Leg: Unable to try or needs assist to prevent fall Total Score: 32 Static Sitting Balance Static Sitting - Balance Support: Feet supported;No upper extremity supported Static Sitting - Level of Assistance: 6: Modified independent (Device/Increase time) Dynamic Sitting Balance Dynamic Sitting - Balance Support: Feet supported;No upper extremity supported Dynamic Sitting - Level of Assistance: 6: Modified independent (Device/Increase time) Static Standing Balance Static Standing - Balance Support: Bilateral upper extremity supported;During functional activity (RW) Static Standing - Level of Assistance: 5: Stand by assistance (supervision) Dynamic Standing Balance Dynamic Standing - Balance Support: Bilateral upper extremity supported;During functional activity Dynamic Standing - Level of Assistance: 5: Stand by assistance (RW) Dynamic Standing - Comments: with transfers and gait Extremity Assessment  RLE Assessment RLE Assessment: Exceptions to [facility] General Strength Comments: tested sitting in recliner RLE Strength Right Hip Flexion: 4-/5 Right Hip ABduction: 4/5 Right Hip ADduction: 4-/5 Right Knee Flexion: 3+/5 Right Knee Extension: 3+/5 Right Ankle Dorsiflexion: 4-/5 Right Ankle Plantar Flexion: 4-/5 LLE Assessment LLE Assessment: Exceptions to [facility] General Strength Comments: tested sitting in recliner LLE Strength Left Hip Flexion: 4-/5 Left Hip ABduction:  4/5 Left Hip ADduction: 4-/5 Left Knee Flexion: 4-/5 Left Knee Extension: 4-/5 Left Ankle Dorsiflexion: 4/5 Left Ankle Plantar Flexion: 4/5   Anna M Zaunegger Tobi Bastos Zaunegger PT, DTaP Philip Aspen, PT, DPT, CBIS  07/23/2023, 7:21 AM

## 2023-07-24 LAB — GLUCOSE, CAPILLARY
Glucose-Capillary: 160 mg/dL — ABNORMAL HIGH (ref 70–99)
Glucose-Capillary: 134 mg/dL — ABNORMAL HIGH (ref 70–99)
Glucose-Capillary: 101 mg/dL — ABNORMAL HIGH (ref 70–99)
Glucose-Capillary: 131 mg/dL — ABNORMAL HIGH (ref 70–99)

## 2023-07-24 MED ORDER — AMLODIPINE BESYLATE 10 MG PO TABS
10.0000 mg | ORAL_TABLET | Freq: Every day | ORAL | 0 refills | Status: DC
  Filled 2023-07-24: qty 30, 30d supply, fill #0

## 2023-07-24 MED ORDER — DICLOFENAC SODIUM 1 % EX GEL
2.0000 g | Freq: Three times a day (TID) | CUTANEOUS | 0 refills | Status: AC
Start: 2023-07-25 — End: ?
  Filled 2023-07-24 – 2023-10-01 (×2): qty 100, 16d supply, fill #0

## 2023-07-24 MED ORDER — MAGNESIUM OXIDE 400 MG PO TABS
200.0000 mg | ORAL_TABLET | Freq: Every day | ORAL | 0 refills | Status: DC
  Filled 2023-07-24: qty 30, 60d supply, fill #0

## 2023-07-24 MED ORDER — VITAMIN D (ERGOCALCIFEROL) 1.25 MG (50000 UNIT) PO CAPS
50000.0000 [IU] | ORAL_CAPSULE | ORAL | 0 refills | Status: DC
  Filled 2023-07-24: qty 5, 35d supply, fill #0

## 2023-07-24 MED ORDER — PROPRANOLOL HCL 10 MG PO TABS
10.0000 mg | ORAL_TABLET | Freq: Every day | ORAL | 0 refills | Status: DC
  Filled 2023-07-24: qty 30, 30d supply, fill #0

## 2023-07-24 MED ORDER — METFORMIN HCL 850 MG PO TABS
850.0000 mg | ORAL_TABLET | Freq: Two times a day (BID) | ORAL | 0 refills | Status: DC
Start: 2023-07-25 — End: 2023-08-07
  Filled 2023-07-24: qty 14, 7d supply, fill #0

## 2023-07-24 MED ORDER — HYDROCODONE-ACETAMINOPHEN 5-325 MG PO TABS
1.0000 | ORAL_TABLET | Freq: Every day | ORAL | 0 refills | Status: DC
  Filled 2023-07-24: qty 30, 30d supply, fill #0

## 2023-07-24 MED ORDER — MUSCLE RUB 10-15 % EX CREA
1.0000 | TOPICAL_CREAM | Freq: Three times a day (TID) | CUTANEOUS | 0 refills | Status: AC
  Filled 2023-07-24: qty 85, 29d supply, fill #0

## 2023-07-24 NOTE — Progress Notes (Signed)
Physical Therapy Session Note  Patient Details  Name: Cory Brown MRN: [id number] Date of Birth: 12-02-1950  Today's Date: 07/24/2023 PT Individual Time: 0835-0930 PT Individual Time Calculation (min): 55 min   Short Term Goals: Week 1:  PT Short Term Goal 1 (Week 1): Pt will ambulate 150 feet with LRAD and CGA PT Short Term Goal 1 - Progress (Week 1): Met PT Short Term Goal 2 (Week 1): Pt will perform bed mobility with CGA with LRAD PT Short Term Goal 2 - Progress (Week 1): Met PT Short Term Goal 3 (Week 1): pt will perform stand pivot transfer with CGA with LRAD PT Short Term Goal 3 - Progress (Week 1): Met Week 2:  PT Short Term Goal 1 (Week 2): STG=LTG due to LOS   Skilled Therapeutic Interventions/Progress Updates:    Session focused on overall functional mobility, genera strengthening and endurance, and preparation for discharge. Pt performed transfers throughout session at supervision level with RW and without RW with CGA overall. Functional gait on unit > 150' with RW with supervision with cues for upright posture and increased step length and heel strike (increased shuffling noted as fatigue). Administered Berg Balance assessment for fall risk assessment (see results below) and discussed with pt with continued recommendation for use of RW for mobility due to fall risk. Pt performed stair negotiation training for home entry and community mobility practice as well as overall strengthening and balance training x 12 steps with close supervision using bilateral handrails for support. Introduced HEP for [employer] exercises to address strength and balance. Pt performed 10  reps each for BLE including LAQ with 5 second hold, standing hip abduction, standing hamstring curls, mini squats, and standing heel/toe raises and 5 reps of sit <> stands. Hand out given to pt as well and placed in his notebook in the room. End of session transferred to recliner with all needs in reach. Pt denies concerns with  upcoming d/c.    Therapy Documentation Precautions:  Precautions Precautions: Fall Precaution Comments: mild R hemi Restrictions Weight Bearing Restrictions: No  Pain:  Reports "the usual knee pain" - medication given with RN at start of session.  Balance: Balance Balance Assessed: Yes Standardized Balance Assessment Standardized Balance Assessment: Berg Balance Test Berg Balance Test Sit to Stand: Able to stand  independently using hands Standing Unsupported: Able to stand safely 2 minutes Sitting with Back Unsupported but Feet Supported on Floor or Stool: Able to sit safely and securely 2 minutes Stand to Sit: Controls descent by using hands Transfers: Able to transfer with verbal cueing and /or supervision Standing Unsupported with Eyes Closed: Able to stand 10 seconds safely Standing Ubsupported with Feet Together: Able to place feet together independently and stand for 1 minute with supervision From Standing, Reach Forward with Outstretched Arm: Can reach forward >5 cm safely (2") From Standing Position, Pick up Object from Floor: Able to pick up shoe, needs supervision From Standing Position, Turn to Look Behind Over each Shoulder: Turn sideways only but maintains balance Turn 360 Degrees: Needs close supervision or verbal cueing Standing Unsupported, Alternately Place Feet on Step/Stool: Able to complete >2 steps/needs minimal assist Standing Unsupported, One Foot in Front: Loses balance while stepping or standing Standing on One Leg: Unable to try or needs assist to prevent fall Total Score: 32 Static Sitting Balance Static Sitting - Balance Support: Feet supported;No upper extremity supported Static Sitting - Level of Assistance: 6: Modified independent (Device/Increase time) Dynamic Sitting Balance Dynamic Sitting -  Balance Support: Feet supported;No upper extremity supported Dynamic Sitting - Level of Assistance: 6: Modified independent (Device/Increase time) Static  Standing Balance Static Standing - Balance Support: Bilateral upper extremity supported;During functional activity (RW) Static Standing - Level of Assistance: 5: Stand by assistance (supervision) Dynamic Standing Balance Dynamic Standing - Balance Support: Bilateral upper extremity supported;During functional activity Dynamic Standing - Level of Assistance: 5: Stand by assistance (RW) Dynamic Standing - Comments: with transfers and gait     Therapy/Group: Individual Therapy  Karolee Stamps Darrol Poke, PT, DPT, CBIS  07/24/2023, 9:38 AM

## 2023-07-24 NOTE — Progress Notes (Signed)
Inpatient Rehabilitation Care Coordinator Discharge Note   Patient Details  Name: Cory Brown MRN: [id number] Date of Birth: August 12, 1951   Discharge location: Home  Length of Stay: 14 Days  Discharge activity level: Sup/Min  Home/community participation: Son and DIL  Patient response XB:JYNWGN Literacy - How often do you need to have someone help you when you read instructions, pamphlets, or other written material from your doctor or pharmacy?: Sometimes  Patient response FA:OZHYQM Isolation - How often do you feel lonely or isolated from those around you?: Sometimes  Services provided included: SW, Neuropsych, Pharmacy, TR, CM, SLP, RN, OT, PT, RD, MD  Financial Services:  Financial Services Utilized: Private Insurance [employer]  Choices offered to/list presented to: Patient and son, Barbara Cower  Follow-up services arranged:  Home Health, DME Home Health Agency: [facility]    DME : Levan Hurst, bedside [employer] and Tub Transfer Bench    Patient response to transportation need: Is the patient able to respond to transportation needs?: Yes In the past 12 months, has lack of transportation kept you from medical appointments or from getting medications?: No In the past 12 months, has lack of transportation kept you from meetings, work, or from getting things needed for daily living?: No   Patient/Family verbalized understanding of follow-up arrangements:  Yes  Individual responsible for coordination of the follow-up plan: Barbara Cower [phone]  Confirmed correct DME delivered: Andria Rhein 07/24/2023    Comments (or additional information):  Summary of Stay    Date/Time Discharge Planning CSW  07/17/23 1418 Discharging homr with son to take some time off work and nephew to assist. CJB       Andria Rhein

## 2023-07-24 NOTE — Progress Notes (Signed)
PROGRESS NOTE   Subjective/Complaints: Seen ambulating in hallway 150 feet with supervision Patient's chart reviewed- No issues reported overnight Vitals signs stable   ROS:   Pt denies SOB, abd pain, CP, N/V/C/D, and vision changes    Objective:   No results found. Recent Labs    07/23/23 0614  WBC 5.6  HGB 16.0  HCT 45.0  PLT 188     Recent Labs    07/23/23 0614  NA 135  K 3.9  CL 103  CO2 24  GLUCOSE 134*  BUN 31*  CREATININE 1.16  CALCIUM 9.1      Intake/Output Summary (Last 24 hours) at 07/24/2023 1034 Last data filed at 07/24/2023 0734 Gross per 24 hour  Intake 720 ml  Output --  Net 720 ml         Physical Exam: Vital Signs Blood pressure (!) 140/85, pulse 75, temperature 98.2 F (36.8 C), temperature source Oral, resp. rate 17, height 6' (1.829 m), weight 86.9 kg, SpO2 99%.  Gen: no distress, normal appearing HEENT: oral mucosa pink and moist, NCAT Cardio: Reg rate Chest: normal effort, normal rate of breathing Abd: soft, non-distended Ext: no edema Psychiatric: appropriate- brighter affect-  Neurological: poor STM Skin:    General: Skin is warm and dry.     Findings: Bruising present.  Neurological:     Mental Status: He is alert.     Comments: Oriented to self and DOB. Mild right facial droop. He was unable to recall age, details leading to admission, unable to recall month, year, season or hospital. Expressive deficits--was unable to describe being in the hospital. He was able to follow simple motor commands.   Psych: pleasant GU: urinary incontinence MSK: TTP hip, lower back, knees   Assessment/Plan: 1. Functional deficits which require 3+ hours per day of interdisciplinary therapy in a comprehensive inpatient rehab setting. Physiatrist is providing close team supervision and 24 hour management of active medical problems listed below. Physiatrist and rehab team continue  to assess barriers to discharge/monitor patient progress toward functional and medical goals  Care Tool:  Bathing    Body parts bathed by patient: Right arm, Left arm, Chest, Abdomen, Front perineal area, Right upper leg, Left upper leg, Face, Buttocks, Right lower leg, Left lower leg   Body parts bathed by helper: Buttocks, Left lower leg, Right lower leg     Bathing assist Assist Level: Supervision/Verbal cueing     Upper Body Dressing/Undressing Upper body dressing   What is the patient wearing?: Pull over shirt    Upper body assist Assist Level: Set up assist    Lower Body Dressing/Undressing Lower body dressing      What is the patient wearing?: Underwear/pull up, Pants     Lower body assist Assist for lower body dressing: Supervision/Verbal cueing     Toileting Toileting    Toileting assist Assist for toileting: Supervision/Verbal cueing     Transfers Chair/bed transfer  Transfers assist     Chair/bed transfer assist level: Supervision/Verbal cueing     Locomotion Ambulation   Ambulation assist      Assist level: Supervision/Verbal cueing Assistive device: Walker-rolling Max distance: 164ft  Walk 10 feet activity   Assist     Assist level: Supervision/Verbal cueing Assistive device: Walker-rolling   Walk 50 feet activity   Assist    Assist level: Supervision/Verbal cueing Assistive device: Walker-rolling    Walk 150 feet activity   Assist Walk 150 feet activity did not occur: Safety/medical concerns  Assist level: Supervision/Verbal cueing Assistive device: Walker-rolling    Walk 10 feet on uneven surface  activity   Assist     Assist level: Supervision/Verbal cueing Assistive device: Walker-rolling   Wheelchair     Assist Is the patient using a wheelchair?: No Type of Wheelchair: Manual Wheelchair activity did not occur: N/A    Max wheelchair distance: 50    Wheelchair 50 feet with 2 turns  activity    Assist    Wheelchair 50 feet with 2 turns activity did not occur: N/A   Assist Level: Moderate Assistance - Patient 50 - 74%   Wheelchair 150 feet activity     Assist  Wheelchair 150 feet activity did not occur: N/A   Assist Level: Maximal Assistance - Patient 25 - 49%   Blood pressure (!) 140/85, pulse 75, temperature 98.2 F (36.8 C), temperature source Oral, resp. rate 17, height 6' (1.829 m), weight 86.9 kg, SpO2 99%.    Medical Problem List and Plan: 1. Functional deficits secondary to acute left internal carotid artery CVA             -patient may shower             -ELOS/Goals: 2-3 weeks days S          Con't CIR PT, OT and SLP  Family education done yesterday   2.  Antithrombotics: -DVT/anticoagulation:  Pharmaceutical: Other (comment)--Pradaxa             -antiplatelet therapy: N/A 3. Bilateral knee pain: Intolereant of tylenol. Norco scheduled daily before therapy  9/22- changed to Norco q8 hours prn- for pain- since was already on 1x/day- might be worth giving Tramadol? 4. Mood/Behavior/Sleep: LCSW to follow for evaluation and support.              -antipsychotic agents: N/A             --will add melatonin prn for occasional insomnia 5. Neuropsych/cognition: This patient is not  capable of making decisions on his own behalf. 6. Skin/Wound Care: Routine pressure relief measures.  7. Fluids/Electrolytes/Nutrition:  Monitor I/O. Check CMET in am 8. A fib: Monitor HR TID--continue metoprolol and Pradaxa, increase magnesium to 500mg  HS  9. T2DM w/neuropathy: Hgb A1c-7.1. Will start monitoring  BS ac/hs.  --Use SSI for elevated BS. Monitor intake             --Was on jardiance, Amaryl and metformin PTA  -increase metformin to 500mg  BID   -9/16 CBGs controlled overall, continue current medications  9/21-9/22 CBGs 88-142- controlled- con't regimen  10. Pre-renal azotemia: BUN trending up 26-->31. Encourage fluid intake. Placed nursing order to  encourage 6-8 glasses of water per day  -9/16 BUN a little improved at 24, creatinine 1.06.  Continue to encourage oral p.o. intake   11. Chronic LBP/neck as well as hip, knee, shoulder: continue Voltaren gel added knees and right shoulder             --monitor for symptoms with increase in activity.             Kpad ordered  Will discuss tens unit   12. Suboptimal  vitamin D: ergocalciferol 50,000U once per week started, continue  13. HTN: start magnesium supplement 250mg  HS, level reviewed and is stable at 2, start propanolol 10mg  daily  9/21-9/22 BP controlled- con't regimen     07/24/2023    5:57 AM 07/23/2023    7:41 PM 07/23/2023    2:28 PM  Vitals with BMI  Systolic 140 124 782  Diastolic 85 67 65  Pulse 75 71 69     14. Bilateral knee pain: neoprene knee sleeves ordered, discussed with patient and he finds these helpful, continue voltaren gel. Zynex knee braces ordered, Norco scheduled daily  9/22- ordered Norco q8 hours prn 15. Urinary continence: continue bladder program to take patient to bathroom q2H while awake   16. Red eyes: continue clear eyes, discussed improvement  17. Indigestion: d/c maalox since was not helpful, discussed eating smaller meals  18. Chest pain: continue prn nitrolgycerin. EKG reviewed and negative for infarct, troponin reviewed and is stable.   19. Essential tremor: continue propanolol 10mg  daily       LOS: 13 days A FACE TO FACE EVALUATION WAS PERFORMED  Drema Pry Mariadelosang Wynns 07/24/2023, 10:34 AM

## 2023-07-24 NOTE — Discharge Instructions (Signed)
Inpatient Rehab Discharge Instructions  Cory Brown Discharge date and time: 07/25/23   Activities/Precautions/ Functional Status: Activity: no lifting, driving, or strenuous exercise till cleared by MD Diet: cardiac diet and diabetic diet Wound Care: none needed   Functional status:  ___ No restrictions     ___ Walk up steps independently _X__ 24/7 supervision/assistance   ___ Walk up steps with assistance ___ Intermittent supervision/assistance  ___ Bathe/dress independently ___ Walk with walker     ___ Bathe/dress with assistance ___ Walk Independently    ___ Shower independently ___ Walk with assistance    _X__ Shower with assistance _X__ No alcohol     ___ Return to work/school ________   Special Instructions: Monitor blood sugars before meals and/or at bedtime at least twice a day and follow up with PCP for adjustment of medications.  2. You received on week supply of metformin (due to our limited supply). One month additional supply has been called in to your local pharmacy and can be picked up today or tomorrow so you will have adequate supply till follow up with your PCP.    COMMUNITY REFERRALS UPON DISCHARGE:    Home Health:   PT     OT                     Agency: [facility]  [phone]    Medical Equipment/Items Ordered: Agricultural consultant, Bedside Commode and Tub Advertising copywriter                                                 Agency/Supplier: [facility] [phone]     STROKE/TIA DISCHARGE INSTRUCTIONS SMOKING Cigarette smoking nearly doubles your risk of having a stroke & is the single most alterable risk factor  If you smoke or have smoked in the last 12 months, you are advised to quit smoking for your health. Most of the excess cardiovascular risk related to smoking disappears within a year of stopping. Ask you doctor about anti-smoking medications [place] Quit Line: 1-800-QUIT NOW Free Smoking Cessation Classes [phone]  CHOLESTEROL Know your levels; limit  fat & cholesterol in your diet  Lipid Panel     Component Value Date/Time   CHOL 88 07/09/2023 0718   TRIG 74 07/09/2023 0718   HDL 34 (L) 07/09/2023 0718   CHOLHDL 2.6 07/09/2023 0718   VLDL 15 07/09/2023 0718   LDLCALC 39 07/09/2023 0718     Many patients benefit from treatment even if their cholesterol is at goal. Goal: Total Cholesterol (CHOL) less than 160 Goal:  Triglycerides (TRIG) less than 150 Goal:  HDL greater than 40 Goal:  LDL (LDLCALC) less than 100   BLOOD PRESSURE American Stroke Association blood pressure target is less that 120/80 mm/Hg  Your discharge blood pressure is:  BP: 118/73 Monitor your blood pressure Limit your salt and alcohol intake Many individuals will require more than one medication for high blood pressure  DIABETES (A1c is a blood sugar average for last 3 months) Goal HGBA1c is under 7% (HBGA1c is blood sugar average for last 3 months)  Diabetes:     Lab Results  Component Value Date   HGBA1C 7.1 (H) 07/08/2023    Your HGBA1c can be lowered with medications, healthy diet, and exercise. Check your blood sugar as directed by your physician Call your physician if you experience  unexplained or low blood sugars.  PHYSICAL ACTIVITY/REHABILITATION Goal is 30 minutes at least 4 days per week  Activity: Increase activity slowly, and No driving, Therapies: see above Return to work: N/A Activity decreases your risk of heart attack and stroke and makes your heart stronger.  It helps control your weight and blood pressure; helps you relax and can improve your mood. Participate in a regular exercise program. Talk with your doctor about the best form of exercise for you (dancing, walking, swimming, cycling).  DIET/WEIGHT Goal is to maintain a healthy weight  Your discharge diet is:  Diet Order             Diet heart healthy/carb modified Room service appropriate? Yes with Assist; Fluid consistency: Thin  Diet effective now                    liquids Your height is:  Height: 6' (182.9 cm) Your current weight is: Weight: 86.9 kg Your Body Mass Index (BMI) is:  BMI (Calculated): 25.98 Following the type of diet specifically designed for you will help prevent another stroke. Your goal weight is:   Your goal Body Mass Index (BMI) is 19-24. Healthy food habits can help reduce 3 risk factors for stroke:  High cholesterol, hypertension, and excess weight.  RESOURCES Stroke/Support Group:  Call [phone]   STROKE EDUCATION PROVIDED/REVIEWED AND GIVEN TO PATIENT Stroke warning signs and symptoms How to activate emergency medical system (call 911). Medications prescribed at discharge. Need for follow-up after discharge. Personal risk factors for stroke. Pneumonia vaccine given:  Flu vaccine given:  My questions have been answered, the writing is legible, and I understand these instructions.  I will adhere to these goals & educational materials that have been provided to me after my discharge from the hospital.     My questions have been answered and I understand these instructions. I will adhere to these goals and the provided educational materials after my discharge from the hospital.  Patient/Caregiver Signature _______________________________ Date __________  Clinician Signature _______________________________________ Date __________  Please bring this form and your medication list with you to all your follow-up doctor's appointments.

## 2023-07-24 NOTE — Plan of Care (Signed)
  Problem: RH Memory Goal: LTG Patient will follow step by step directions w/cues (SLP) Description: LTG: Patient will follow step by step directions with cues (SLP). Outcome: Not Met (add Reason) Goal: LTG Patient will demonstrate ability for day to day (SLP) Description: LTG:   Patient will demonstrate ability for day to day recall/carryover during cognitive/linguistic activities with assist  (SLP) Outcome: Not Met (add Reason)   Problem: RH Cognition - SLP Goal: RH LTG Patient will demonstrate orientation with cues Description:  LTG:  Patient will demonstrate orientation to person/place/time/situation with cues (SLP)   Outcome: Completed/Met   Problem: RH Comprehension Communication Goal: LTG Patient will comprehend basic/complex auditory (SLP) Description: LTG: Patient will comprehend basic/complex auditory information with cues (SLP). Outcome: Completed/Met   Problem: RH Expression Communication Goal: LTG Patient will verbally express basic/complex needs(SLP) Description: LTG:  Patient will verbally express basic/complex needs, wants or ideas with cues  (SLP) Outcome: Completed/Met   Problem: RH Problem Solving Goal: LTG Patient will demonstrate problem solving for (SLP) Description: LTG:  Patient will demonstrate problem solving for basic/complex daily situations with cues  (SLP) Outcome: Completed/Met

## 2023-07-24 NOTE — Plan of Care (Signed)
  Problem: RH Eating Goal: LTG Patient will perform eating w/assist, cues/equip (OT) Description: LTG: Patient will perform eating with assist, with/without cues using equipment (OT) Outcome: Not Met (add Reason) Flowsheets (Taken 07/24/2023 1158) LTG: Pt will perform eating with assistance level of: (not met due to cues needed for initiation, processing and awareness therefore increased assist needed) --   Problem: RH Grooming Goal: LTG Patient will perform grooming w/assist,cues/equip (OT) Description: LTG: Patient will perform grooming with assist, with/without cues using equipment (OT) Outcome: Not Met (add Reason) Flowsheets (Taken 07/24/2023 1158) LTG: Pt will perform grooming with assistance level of: (not met due to cues needed for initiation, processing and awareness therefore increased assist needed) --   Problem: RH Dressing Goal: LTG Patient will perform upper body dressing (OT) Description: LTG Patient will perform upper body dressing with assist, with/without cues (OT). Outcome: Not Met (add Reason) Flowsheets (Taken 07/24/2023 1158) LTG: Pt will perform upper body dressing with assistance level of: (not met due to cues needed for initiation, processing and awareness therefore increased assist needed) --   Problem: RH Balance Goal: LTG: Patient will maintain dynamic sitting balance (OT) Description: LTG:  Patient will maintain dynamic sitting balance with assistance during activities of daily living (OT) Outcome: Completed/Met Goal: LTG Patient will maintain dynamic standing with ADLs (OT) Description: LTG:  Patient will maintain dynamic standing balance with assist during activities of daily living (OT)  Outcome: Completed/Met   Problem: Sit to Stand Goal: LTG:  Patient will perform sit to stand in prep for activites of daily living with assistance level (OT) Description: LTG:  Patient will perform sit to stand in prep for activites of daily living with assistance level  (OT) Outcome: Completed/Met   Problem: RH Bathing Goal: LTG Patient will bathe all body parts with assist levels (OT) Description: LTG: Patient will bathe all body parts with assist levels (OT) Outcome: Completed/Met   Problem: RH Dressing Goal: LTG Patient will perform lower body dressing w/assist (OT) Description: LTG: Patient will perform lower body dressing with assist, with/without cues in positioning using equipment (OT) Outcome: Completed/Met   Problem: RH Toileting Goal: LTG Patient will perform toileting task (3/3 steps) with assistance level (OT) Description: LTG: Patient will perform toileting task (3/3 steps) with assistance level (OT)  Outcome: Completed/Met   Problem: RH Functional Use of Upper Extremity Goal: LTG Patient will use RT/LT upper extremity as a (OT) Description: LTG: Patient will use right/left upper extremity as a stabilizer/gross assist/diminished/nondominant/dominant level with assist, with/without cues during functional activity (OT) Outcome: Completed/Met   Problem: RH Toilet Transfers Goal: LTG Patient will perform toilet transfers w/assist (OT) Description: LTG: Patient will perform toilet transfers with assist, with/without cues using equipment (OT) Outcome: Completed/Met   Problem: RH Tub/Shower Transfers Goal: LTG Patient will perform tub/shower transfers w/assist (OT) Description: LTG: Patient will perform tub/shower transfers with assist, with/without cues using equipment (OT) Outcome: Completed/Met

## 2023-07-24 NOTE — Progress Notes (Signed)
Occupational Therapy Discharge Summary  Patient Details  Name: Cory Brown MRN: [id number] Date of Birth: 26-Sep-1951  Date of Discharge from OT service:July 24, 2023   Patient has met 10 of 13 long term goals due to improved activity tolerance, improved balance, functional use of  RIGHT upper, RIGHT lower, and LEFT lower extremity, and improved coordination.  Patient to discharge at overall Supervision level.  Patient's care partner is independent to provide the necessary physical and cognitive assistance at discharge. Pt's son and daughter in law attended family education training on 9/21 and verbalized and demonstrated confidence with tasks to ensure safe discharge home.   Reasons goals not met: Did not meet eating, upper body dressing and grooming goal at mod I level due to cues needed for initiation, sequencing and awareness as a result of cognitive deficits, requiring increased assist at the set up A level  Recommendation:  Patient will benefit from ongoing skilled OT services in home health setting to continue to advance functional skills in the area of BADL and Reduce care partner burden.  Equipment: BSC and TTB  Reasons for discharge: treatment goals met  Patient/family agrees with progress made and goals achieved: Yes  OT Discharge Precautions/Restrictions  Precautions Precautions: Fall Precaution Comments: mild R hemi Restrictions Weight Bearing Restrictions: No ADL ADL Eating: Set up Where Assessed-Eating: Wheelchair Grooming: Setup Where Assessed-Grooming: Sitting at sink Upper Body Bathing: Supervision/safety Where Assessed-Upper Body Bathing: Shower Lower Body Bathing: Supervision/safety Where Assessed-Lower Body Bathing: Shower Upper Body Dressing: Setup Where Assessed-Upper Body Dressing: Wheelchair Lower Body Dressing: Supervision/safety Where Assessed-Lower Body Dressing: Sitting at sink, Standing at sink Toileting: Supervision/safety Where  Assessed-Toileting: Teacher, adult education: Close supervision Toilet Transfer Method: Proofreader: Raised toilet seat, Grab bars Tub/Shower Transfer: Close supervison Web designer Method: Ship broker: Insurance underwriter: Close supervision Film/video editor Method: Designer, industrial/product: Emergency planning/management officer, Grab bars ADL Comments: Cues for initiation, sequencing and scanning, requires support due to R hand dom weakness Vision Baseline Vision/History: 1 Wears glasses Patient Visual Report: No change from baseline Vision Assessment?: No apparent visual deficits Perception  Perception: Impaired Perception-Other Comments: Rt inattention Praxis Praxis: Impaired Praxis Impairment Details: Motor planning;Initiation Cognition Cognition Overall Cognitive Status: Impaired/Different from baseline Arousal/Alertness: Awake/alert Orientation Level: Person;Place;Situation Person: Oriented Place: Oriented Situation: Disoriented Memory: Impaired Awareness: Impaired Problem Solving: Impaired Safety/Judgment: Impaired Brief Interview for Mental Status (BIMS) Repetition of Three Words (First Attempt): 3 Temporal Orientation: Year: Missed by more than 5 years Temporal Orientation: Month: Missed by more than 1 month Temporal Orientation: Day: Incorrect Recall: "Sock": No, could not recall Recall: "Blue": Yes, no cue required Recall: "Bed": No, could not recall BIMS Summary Score: 5 Sensation Sensation Light Touch: Impaired by gross assessment Light Touch Impaired Details: Impaired RLE Hot/Cold: Not tested Proprioception: Appears Intact Stereognosis: Not tested Additional Comments: decreased sensation along RLE Coordination Gross Motor Movements are Fluid and Coordinated: No Fine Motor Movements are Fluid and Coordinated: No Coordination and Movement Description: R hemi, global  weakness/decnditioning, pain, cognitive impairments, impaired motor planning/sequencing Finger Nose Finger Test: slow R>L Heel Shin Test: decreased ROM and speed bilaterally Motor  Motor Motor: Hemiplegia Motor - Skilled Clinical Observations: R hemi, global weakness/decnditioning, pain, cognitive impairments, impaired motor planning/sequencing Mobility  Bed Mobility Bed Mobility: Rolling Right;Rolling Left;Sit to Supine;Supine to Sit Rolling Right: Supervision/verbal cueing Rolling Left: Supervision/Verbal cueing Supine to Sit: Supervision/Verbal cueing Sit to Supine: Supervision/Verbal cueing Transfers Sit to Stand: Supervision/Verbal cueing  Stand to Sit: Supervision/Verbal cueing  Trunk/Postural Assessment  Cervical Assessment Cervical Assessment: Exceptions to [facility] (forward head) Thoracic Assessment Thoracic Assessment: Exceptions to [facility] (thoracic rounding) Lumbar Assessment Lumbar Assessment: Exceptions to [facility] Postural Control Postural Control: Deficits on evaluation Righting Reactions: slightly delayed on R Protective Responses: slightly delayed on R  Balance Balance Balance Assessed: Yes Static Sitting Balance Static Sitting - Balance Support: Feet supported;No upper extremity supported Static Sitting - Level of Assistance: 6: Modified independent (Device/Increase time) Dynamic Sitting Balance Dynamic Sitting - Balance Support: Feet supported;No upper extremity supported Dynamic Sitting - Level of Assistance: 6: Modified independent (Device/Increase time) Static Standing Balance Static Standing - Balance Support: Bilateral upper extremity supported;During functional activity (RW) Static Standing - Level of Assistance: 5: Stand by assistance (supervision) Dynamic Standing Balance Dynamic Standing - Balance Support: Bilateral upper extremity supported;During functional activity Dynamic Standing - Level of Assistance: 5: Stand by assistance (RW) Dynamic Standing -  Comments: with transfers and gait Extremity/Trunk Assessment RUE Assessment RUE Assessment: Within Functional Limits Active Range of Motion (AROM) Comments: WFL General Strength Comments: 4-/5 grossly LUE Assessment LUE Assessment: Within Functional Limits   Ricki Clack E Ieasha Boerema, MS, OTR/L  07/24/2023, 11:58 AM

## 2023-07-24 NOTE — Progress Notes (Signed)
Speech Language Pathology Discharge Summary  Patient Details  Name: Cory Brown MRN: [id number] Date of Birth: Jan 17, 1951  Date of Discharge from SLP service:July 24, 2023  Today's Date: 07/24/2023 SLP Individual Time: 1430-1530 SLP Individual Time Calculation (min): 60 min  Skilled Therapeutic Interventions:  SLP conducted skilled therapy session targeting cognitive retraining goals. Patient noticeably improved this session, engaged and alert throughout making fewer self-deprecating comments. SLP guided patient through basic card game task requiring sequencing, basic problem solving, and quick identification with patient requiring minA to recall specific game rules throughout. Patient and SLP discussed discharge plans with patient identifying broad aspects of plan with supervision. Patient oriented to time and place given minA to utilize external aids and oriented to broad situation (!!!) with supervision. During card sorting task, patient required minA to identify errors made. Once identified, patient corrected errors with supervision. During session, patient communicated basic wants/needs with minA and demonstrated understanding of basic auditory information provided by therapist throughout. Patient was left in lowered bed with call bell in reach and bed alarm set. SLP will continue to target goals per plan of care.    Patient has met 4 of 6 long term goals.  Patient to discharge at overall Min;Mod (depending on task complexity) level.  Reasons goals not met: severity of impairments   Clinical Impression/Discharge Summary: Patient made excellent gains towards therapy goals, meeting 4/6 long term goals set for admission. Patient currently discharging at overall min to modA level for cognitive tasks depending on task complexity. For basic functional and familiar tasks, minA need demonstrated at most recent session. Patient will require modA for more complex functional tasks e.g. sorting  medications, finances, etc. Patient currently communicating basic wants/needs effectively with minA and demonstrating understanding of basic auditory information. Patient and family education complete at this time. Patient appropriate for discharge from this level of care, though would benefit from SLP services at next level of care (home health) to continue to target communication and cognitive goals within a functional setting. SLP will sign off.    Care Partner:  Caregiver Able to Provide Assistance: Yes  Type of Caregiver Assistance: Cognitive  Recommendation:  Home Health SLP  Rationale for SLP Follow Up: Maximize cognitive function and independence;Maximize functional communication   Equipment: n/a   Reasons for discharge: Discharged from hospital   Patient/Family Agrees with Progress Made and Goals Achieved: Yes   Cory Brown, M.A., CCC-SLP  Cory Brown 07/24/2023, 4:04 PM

## 2023-07-24 NOTE — Progress Notes (Signed)
Occupational Therapy Session Note  Patient Details  Name: Cory Brown MRN: [id number] Date of Birth: Mar 18, 1951  Today's Date: 07/24/2023 OT Individual Time: 1914-7829 OT Individual Time Calculation (min): 70 min    Short Term Goals: Week 2:  OT Short Term Goal 1 (Week 2): STG = LTG due to ELOS  Skilled Therapeutic Interventions/Progress Updates:  Skilled OT intervention completed with focus on ambulatory transfers, cardiovascular endurance, dynamic balance, cognition, BUE strengthening. Pt received seated in recliner, agreeable to session. Initially no pain, however increase in pain in both knees with activity. On scheduled meds and pre-medicated. OT offered rest breaks, repositioning and topical heat for pain reduction.  Pt declined self-care needs despite encouragement to trial void due to timed toileting board indicating time to try. Completed all sit > stands and ambulatory transfers with RW and supervision during session. Ambulated to w/c outside in hallway as pt requested to "ride to the gym vs walk." Transported dependently in w/c <> gym.  Pt completed the following intervals on nustep to promote global/cardiovascular endurance needed for independence with BADLs and functional mobility: -10 mins, level 5  Ambulated to BITS, with positional cues needed to back up to w/c prior to sitting. Pt participated in the following activities in standing with supervision without AD balance to address standing tolerance and cognitive strategies needed for independence and safety with BADL management: -Motor speed dot test- 3.41 sec; min difficulty with delayed processing noted -Bell cancellation test- > 4 mins; 2 misses predominantly on Rt superior field  Took pt back to room due to increasing knee pain with request for Voltaren gel. Pt ambulated from chair outside of room > recliner. Voltaren gel not found in room; nurse notified. OT applied heat instead to both knees while pt completed the  following BUE exercises to promote strength needed for BADLs: (With yellow theraband) 10 reps Horizontal abduction Self-anchored shoulder flexion each arm Self-anchored bicep flexion each arm Tricep extension each arm Shoulder external rotation  Pt remained seated in recliner, with chair pad alarm on/activated, and with all needs in reach at end of session.   Therapy Documentation Precautions:  Precautions Precautions: Fall Precaution Comments: mild R hemi Restrictions Weight Bearing Restrictions: No    Therapy/Group: Individual Therapy  [name], MS, OTR/L  07/24/2023, 11:57 AM

## 2023-07-25 ENCOUNTER — Other Ambulatory Visit (HOSPITAL_COMMUNITY): Payer: Self-pay

## 2023-07-25 DIAGNOSIS — I63232 Cerebral infarction due to unspecified occlusion or stenosis of left carotid arteries: Secondary | ICD-10-CM

## 2023-07-25 LAB — GLUCOSE, CAPILLARY
Glucose-Capillary: 142 mg/dL — ABNORMAL HIGH (ref 70–99)
Glucose-Capillary: 131 mg/dL — ABNORMAL HIGH (ref 70–99)

## 2023-07-25 MED ORDER — METFORMIN HCL 850 MG PO TABS: 850.0000 mg | ORAL_TABLET | Freq: Two times a day (BID) | ORAL | 0 refills | Status: DC

## 2023-07-25 NOTE — Progress Notes (Addendum)
Inpatient Rehabilitation Discharge Medication Review by a Pharmacist  A complete drug regimen review was completed for this patient to identify any potential clinically significant medication issues.     High Risk Drug Classes Is patient taking? Indication by Medication  Antipsychotic No   Anticoagulant Yes Pradaxa - atrial fibrillation  Antibiotic No    Opioid Yes Norco - pain  Antiplatelet No    Hypoglycemics/insulin Yes metformin - DM  Vasoactive Medication Yes Amlodipine/metoprolol, propanolol- HTN  Chemotherapy No    Other Yes Gabapentin - neuropathy Voltaren gel - pain Rosuvastatin - HLD Aspercream - pain Mag-Ox, vitamin D - vitamin  supplement  Senokot-S - constipation         Type of Medication Issue Identified Description of Issue Recommendation(s)  Drug Interaction(s) (clinically significant)        Duplicate Therapy        Allergy        No Medication Administration End Date        Incorrect Dose        Additional Drug Therapy Needed        Significant med changes from prior encounter (inform family/care partners about these prior to discharge).      Other    jardiance 25mg , glimepride 2mg  Resume at discharge if needed      Clinically significant medication issues were identified that warrant physician communication and completion of prescribed/recommended actions by midnight of the next day:  No   Name of provider notified for urgent issues identified: n/a   Provider Method of Notification: n/a   Pharmacist comments: n/a   Time spent performing this drug regimen review (minutes):  20   Ruben Im, PharmD Clinical Pharmacist 07/25/2023 12:53 PM Please check AMION for all [facility] numbers

## 2023-07-25 NOTE — Discharge Summary (Signed)
Physician Discharge Summary  Patient ID: Cory Brown MRN: [id number] years [id number] DOB/AGE: [birth year] 72 y.o.  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2  4.0   Chloride 98 - 111 mmol/L 103  105  103   CO2 22 - 32 mmol/L 24  24  26    Calcium 8.9 - 10.3 mg/dL 9.1  9.2  9.3      CBC:    Latest Ref Rng & Units 07/23/2023    6:14 AM 07/16/2023    6:19 AM 07/15/2023    9:32 AM  CBC  WBC 4.0 - 10.5 K/uL 5.6  6.1  6.2   Hemoglobin 13.0 - 17.0 g/dL 08.6  57.8  46.9   Hematocrit 39.0 - 52.0 % 45.0  46.5  47.7   Platelets 150 - 400 K/uL 188  171  188      CBG: Recent Labs  Lab 07/24/23 1147 07/24/23 1701 07/24/23 2053 07/25/23 0642 07/25/23 1123  GLUCAP 134* 101* 131* 142* 131*    Brief HPI:   Cory Brown is a 72 y.o. male with history of left-MCA stroke with mild RLE weakness, chronic low back pain, CAD, PPM, A-fib on Pradaxa who was admitted on 07/08/2023 after found on ground by neighbor.  He was found to have right-sided weakness with facial droop and slurred speech.  CTA head/neck showed perfusion deficits with left ICA occluded origin with thrombus extending to level of ICA terminus.  She underwent cerebral angio with mechanical thrombectomy of left ICA with complete  recanalization.  Follow-up MRI/MRI brain showed scattered foci of acute infarct throughout left MCA and bilateral ACA territories.  Dr. Pearlean Brownie felt that stroke was due to large vessel disease and recommended continue Pradaxa due to question of compliance with medication.  Right knee and right shoulder x-ray done due to pain showed evidence of OA.  Therapy was working with patient who was limited by right-sided weakness with cognitive deficits and aphasia.  CIR was recommended due to functional decline.Marland Kitchen   Hospital Course: Cory Brown was admitted to rehab 07/11/2023 for inpatient therapies to consist of PT, ST and OT at least three hours five days a week. Past admission physiatrist, therapy team and rehab RN have worked together to provide customized collaborative inpatient rehab.  He was maintained on Pradaxa throughout his stay.  He is tolerating this without any side effects.  Follow-up CBC shows H&H and platelets to be stable.  Check of electrolytes did show some worsening of renal status with prerenal azotemia and he was encouraged to increase fluid intake. Blood pressures were monitored on  TID basis and has been stable on current regimen.  Low-dose propranolol was added additionally to help with tremors and better BP control.    He has had issues with chronic pain and Kpad as well as Voltaren gel and Sportscreme were added to help with pain management.  Hydrocodone was also added for use as needed severe pain.  The pain has been a limiting factor with mobility and neoprene knee sleeves were ordered which reported to be effective. His diabetes has been monitored with ac/hs CBG checks and SSI was use prn for tighter BS control.  Metformin was resumed and titrated up to 850 mg twice daily.  Blood sugars are reasonably controlled at current time but anticipate this will likely trend higher once back in home setting and family was advised to resume home dose of 1000 mg twice daily if blood sugars are running  over 150 consistently.    He was found to have suboptimal vitamin D levels therefore ergocalciferol 50000 units was added for weekly supplementation.  He continues to have flat affect with depressed mood and recommend follow-up with PCP for input on addition of SSRI.  He has made gains during his rehab stay but continues to be limited by problems with initiation, sequencing as well as awareness of cognitive deficits.  Supervision is recommended at discharge.  She will continue to receive follow-up home health PT, OT and ST by [facility] after discharge.   Rehab course: During patient's stay in rehab weekly team conferences were held to monitor patient's progress, set goals and discuss barriers to discharge. At admission, patient required max assist with ADL tasks and min assist with mobility.  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  He exhibited semantic paraphasias with word finding difficulty and intermittent language of confusion.  Cognitive evaluation revealed SLUMS score 9/30.  He  has had improvement in activity tolerance, balance, postural control as well as ability to compensate for deficits.  He is able to complete ADL tasks with supervision.  He requires supervision with mobility. He requires min assist with basic functional and familiar tasks as well as expression of basic needs.Marland Kitchen  He requires mod assist with more complex tasks.  Family education has been completed.  Disposition:  Home  Diet: Heart healthy/carb modified.  Special Instructions: Family to assist with medication management and provide supervision with activity. Discharge Instructions     Ambulatory referral to Neurology   Complete by: As directed    An appointment is requested in approximately: 4 weeks   Ambulatory referral to Physical Medicine Rehab   Complete by: As directed       Allergies as of 07/25/2023       Reactions   Acetaminophen         Medication  List     STOP taking these medications    glimepiride 2 MG tablet Commonly known as: AMARYL   Jardiance 25 MG Tabs tablet Generic drug: empagliflozin   methocarbamol 500 MG tablet Commonly known as: ROBAXIN       TAKE these medications    amLODipine 10 MG tablet Commonly known as: NORVASC Take 1 tablet (10 mg total) by mouth daily.   Analgesic Balm 10-15 % Crea Apply 1 Application topically 3 (three) times daily.   dabigatran 150 MG Caps capsule Commonly known as: PRADAXA Take 1 capsule (150 mg total) by mouth every 12 (twelve) hours.   diclofenac Sodium 1 % Gel Commonly known as: VOLTAREN Apply  2 g topically 3 (three) times daily.   gabapentin 100 MG capsule Commonly known as: NEURONTIN Take 100 mg by mouth daily.   HYDROcodone-acetaminophen 5-325 MG tablet Commonly known as: NORCO/VICODIN Take 1 tablet by mouth daily.   magnesium oxide 400 MG tablet Commonly known as: MAG-OX Take 0.5 tablets (200 mg total) by mouth at bedtime.   metFORMIN 850 MG tablet Commonly known as: GLUCOPHAGE Take 1 tablet (850 mg total) by mouth 2 (two) times daily with a meal.   metoprolol succinate 100 MG 24 hr tablet Commonly known as: TOPROL-XL Take 1 tablet (100 mg total) by mouth daily. Take with or immediately following a meal.   propranolol 10 MG tablet Commonly known as: INDERAL Take 1 tablet (10 mg total) by mouth daily.   rosuvastatin 10 MG tablet Commonly known as: CRESTOR Take 1 tablet (10 mg total) by mouth daily.   senna-docusate 8.6-50 MG tablet Commonly known as: Senokot-S Take 1 tablet by mouth at bedtime as needed for mild constipation.   Vitamin D (Ergocalciferol) 1.25 MG (50000 UNIT) Caps capsule Commonly known as: DRISDOL Take 1 capsule (50,000 Units total) by mouth every 7 (seven) days.        Follow-up Information     [facility]   Specialty: General Practice Why: Call in 1-2 days for post hospital follow up Contact  information: [address] [place] [address] [phone]         Horton Chin, MD Follow up.   Specialty: Physical Medicine and Rehabilitation Why: office will call you with follow up appointment Contact information: [address] [place] [address] [phone]         [facility] Follow up.   Why: office will call you with follow up appointment Contact information: [address]     [address] [place] [address] [phone]                Signed: Jacquelynn Cree 07/26/2023, 6:08 PM

## 2023-07-25 NOTE — Progress Notes (Signed)
PROGRESS NOTE   Subjective/Complaints: Sleepy this morning Patient's chart reviewed- No issues reported overnight Vitals signs stable    ROS:   Pt denies SOB, abd pain, CP, N/V/C/D, and vision changes    Objective:   No results found. Recent Labs    07/23/23 0614  WBC 5.6  HGB 16.0  HCT 45.0  PLT 188     Recent Labs    07/23/23 0614  NA 135  K 3.9  CL 103  CO2 24  GLUCOSE 134*  BUN 31*  CREATININE 1.16  CALCIUM 9.1      Intake/Output Summary (Last 24 hours) at 07/25/2023 0933 Last data filed at 07/25/2023 0714 Gross per 24 hour  Intake 820 ml  Output --  Net 820 ml         Physical Exam: Vital Signs Blood pressure 118/73, pulse 70, temperature 98.7 F (37.1 C), resp. rate 17, height 6' (1.829 m), weight 86.9 kg, SpO2 98%.  Gen: no distress, normal appearing HEENT: oral mucosa pink and moist, NCAT Cardio: Reg rate Chest: normal effort, normal rate of breathing Abd: soft, non-distended Ext: no edema Psychiatric: appropriate- brighter affect-  Neurological: poor STM Skin:    General: Skin is warm and dry.     Findings: Bruising present.  Neurological:     Mental Status: He is alert.     Comments: Oriented to self and DOB. Mild right facial droop. He was unable to recall age, details leading to admission, unable to recall month, year, season or hospital. Expressive deficits--was unable to describe being in the hospital. He was able to follow simple motor commands.   Psych: pleasant GU: urinary incontinence MSK: TTP hip, lower back, knees   Assessment/Plan: 1. Functional deficits which require 3+ hours per day of interdisciplinary therapy in a comprehensive inpatient rehab setting. Physiatrist is providing close team supervision and 24 hour management of active medical problems listed below. Physiatrist and rehab team continue to assess barriers to discharge/monitor patient progress  toward functional and medical goals  Care Tool:  Bathing    Body parts bathed by patient: Right arm, Left arm, Chest, Abdomen, Front perineal area, Right upper leg, Left upper leg, Face, Buttocks, Right lower leg, Left lower leg   Body parts bathed by helper: Buttocks, Left lower leg, Right lower leg     Bathing assist Assist Level: Supervision/Verbal cueing     Upper Body Dressing/Undressing Upper body dressing   What is the patient wearing?: Pull over shirt    Upper body assist Assist Level: Set up assist    Lower Body Dressing/Undressing Lower body dressing      What is the patient wearing?: Underwear/pull up, Pants     Lower body assist Assist for lower body dressing: Supervision/Verbal cueing     Toileting Toileting    Toileting assist Assist for toileting: Supervision/Verbal cueing     Transfers Chair/bed transfer  Transfers assist     Chair/bed transfer assist level: Supervision/Verbal cueing     Locomotion Ambulation   Ambulation assist      Assist level: Supervision/Verbal cueing Assistive device: Walker-rolling Max distance: 146ft   Walk 10 feet activity   Assist  Assist level: Supervision/Verbal cueing Assistive device: Walker-rolling   Walk 50 feet activity   Assist    Assist level: Supervision/Verbal cueing Assistive device: Walker-rolling    Walk 150 feet activity   Assist Walk 150 feet activity did not occur: Safety/medical concerns  Assist level: Supervision/Verbal cueing Assistive device: Walker-rolling    Walk 10 feet on uneven surface  activity   Assist     Assist level: Supervision/Verbal cueing Assistive device: Walker-rolling   Wheelchair     Assist Is the patient using a wheelchair?: No Type of Wheelchair: Manual Wheelchair activity did not occur: N/A    Max wheelchair distance: 50    Wheelchair 50 feet with 2 turns activity    Assist    Wheelchair 50 feet with 2 turns activity  did not occur: N/A   Assist Level: Moderate Assistance - Patient 50 - 74%   Wheelchair 150 feet activity     Assist  Wheelchair 150 feet activity did not occur: N/A   Assist Level: Maximal Assistance - Patient 25 - 49%   Blood pressure 118/73, pulse 70, temperature 98.7 F (37.1 C), resp. rate 17, height 6' (1.829 m), weight 86.9 kg, SpO2 98%.    Medical Problem List and Plan: 1. Functional deficits secondary to acute left internal carotid artery CVA             -patient may shower             -ELOS/Goals: 2-3 weeks days S          Con't CIR PT, OT and SLP  Family education done yesterday   2.  Antithrombotics: -DVT/anticoagulation:  Pharmaceutical: Other (comment)--Pradaxa             -antiplatelet therapy: N/A 3. Bilateral knee pain: Intolereant of tylenol. Norco scheduled daily before therapy  9/22- changed to Norco q8 hours prn- for pain- since was already on 1x/day- might be worth giving Tramadol? 4. Mood/Behavior/Sleep: LCSW to follow for evaluation and support.              -antipsychotic agents: N/A             --will add melatonin prn for occasional insomnia 5. Neuropsych/cognition: This patient is not  capable of making decisions on his own behalf. 6. Skin/Wound Care: Routine pressure relief measures.  7. Fluids/Electrolytes/Nutrition:  Monitor I/O. Check CMET in am 8. A fib: Monitor HR TID--continue metoprolol and Pradaxa, increase magnesium to 500mg  HS  9. T2DM w/neuropathy: Hgb A1c-7.1. Will start monitoring  BS ac/hs.  --Use SSI for elevated BS. Monitor intake             --Was on jardiance, Amaryl and metformin PTA  -increase metformin to 500mg  BID   -9/16 CBGs controlled overall, continue current medications  9/21-9/22 CBGs 88-142- controlled- con't regimen  10. Pre-renal azotemia: BUN trending up 26-->31. Encourage fluid intake. Placed nursing order to encourage 6-8 glasses of water per day  -9/16 BUN a little improved at 24, creatinine 1.06.  Continue  to encourage oral p.o. intake   11. Chronic LBP/neck as well as hip, knee, shoulder: continue Voltaren gel added knees and right shoulder             --monitor for symptoms with increase in activity.             Kpad ordered  Will discuss tens unit   12. Suboptimal vitamin D: ergocalciferol 50,000U once per week started, continue  13. HTN: start magnesium supplement  250mg  HS, level reviewed and is stable at 2, continue propanolol 10mg  daily       07/25/2023    9:28 AM 07/25/2023    6:03 AM 07/24/2023    7:28 PM  Vitals with BMI  Systolic 118 100 829  Diastolic 73 81 67  Pulse 70 69 70     14. Bilateral knee pain: neoprene knee sleeves ordered, discussed with patient and he finds these helpful, continue voltaren gel. Zynex knee braces ordered, Norco scheduled TID   15. Urinary continence: continue bladder program to take patient to bathroom q2H while awake   16. Red eyes: continue clear eyes, discussed improvement  17. Indigestion: d/c maalox since was not helpful, discussed eating smaller meals  18. Chest pain: continue prn nitrolgycerin. EKG reviewed and negative for infarct, troponin reviewed and is stable.   19. Essential tremor: continue propanolol 10mg  daily    >30 minutes spent in discharge of patient including review of medications and follow-up appointments, physical examination, and in answering all patient's questions     LOS: 14 days A FACE TO FACE EVALUATION WAS PERFORMED  Drema Pry Cory Brown 07/25/2023, 9:33 AM

## 2023-07-27 ENCOUNTER — Telehealth: Payer: Self-pay

## 2023-07-27 NOTE — Telephone Encounter (Signed)
Transitional Care call--who you talked with    Are you/is patient experiencing any problems since coming home? Are there any questions regarding any aspect of care? NO QUESTIONS Are there any questions regarding medications administration/dosing? Are meds being taken as prescribed? Patient should review meds with caller to confirmNO QUESTIONS Have there been any falls? NO FALLS Has Home Health been to the house and/or have they contacted you? If not, have you tried to contact them? Can we help you contact them? YES HH HAS BEEN IN CONTACT SEEN IN HOME TODAY @ 1PM Are bowels and bladder emptying properly? Are there any unexpected incontinence issues? If applicable, is patient following bowel/bladder programs? YES NO ISSUES Any fevers, problems with breathing, unexpected pain? NO COMPLAINTS  Are there any skin problems or new areas of breakdown?NO COMPLAINTS  Has the patient/family member arranged specialty MD follow up (ie cardiology/neurology/renal/surgical/etc)?  Can we help arrange?YES Does the patient need any other services or support that we can help arrange? NOT AT THIS TIME Are caregivers following through as expected in assisting the patient?YES Has the patient quit smoking, drinking alcohol, or using drugs as recommended? YES   Appointment time, arrive time and who it is with here [address] [phone]

## 2023-08-07 ENCOUNTER — Encounter: Payer: Medicare PPO | Attending: Physical Medicine and Rehabilitation | Admitting: Physical Medicine and Rehabilitation

## 2023-08-07 VITALS — BP 123/73 | HR 69 | Ht 72.0 in | Wt 198.0 lb

## 2023-08-07 DIAGNOSIS — E118 Type 2 diabetes mellitus with unspecified complications: Secondary | ICD-10-CM | POA: Diagnosis not present

## 2023-08-07 DIAGNOSIS — I639 Cerebral infarction, unspecified: Secondary | ICD-10-CM | POA: Insufficient documentation

## 2023-08-07 DIAGNOSIS — R600 Localized edema: Secondary | ICD-10-CM | POA: Insufficient documentation

## 2023-08-07 DIAGNOSIS — M25511 Pain in right shoulder: Secondary | ICD-10-CM | POA: Insufficient documentation

## 2023-08-07 DIAGNOSIS — G8929 Other chronic pain: Secondary | ICD-10-CM | POA: Insufficient documentation

## 2023-08-07 DIAGNOSIS — I959 Hypotension, unspecified: Secondary | ICD-10-CM | POA: Insufficient documentation

## 2023-08-07 DIAGNOSIS — Z794 Long term (current) use of insulin: Secondary | ICD-10-CM

## 2023-08-07 MED ORDER — NITROGLYCERIN 0.4 MG SL SUBL: 0.4000 mg | SUBLINGUAL_TABLET | SUBLINGUAL | 12 refills | Status: AC | PRN

## 2023-08-07 MED ORDER — METFORMIN HCL 1000 MG PO TABS: 1000.0000 mg | ORAL_TABLET | Freq: Two times a day (BID) | ORAL | 3 refills | Status: AC

## 2023-08-07 MED ORDER — AMLODIPINE BESYLATE 5 MG PO TABS: 5.0000 mg | ORAL_TABLET | Freq: Every day | ORAL | 3 refills | Status: DC

## 2023-08-07 NOTE — Progress Notes (Signed)
Subjective:    Patient ID: Cory Brown, male    DOB: 10-22-1951, 72 y.o.   MRN: [id number]  HPI Cory Brown is a 72 year old man who presents for follow-up of CVA and right shoulder pain.  1) Right shoulder pain: -injection did not help -his son knows he does not want to go through surgery.   2) CVA -therapy is working on walking -approval for home therapy was sent by [facility] by [name] and his daughter-in-law  said [name] did a good job.   Pain Inventory Average Pain 6 Pain Right Now 5 My pain is sharp and aching  LOCATION OF PAIN  shoulder, knee, ankle, toes  BOWEL Number of stools per week: 6 Oral laxative use No  Type of laxative . Enema or suppository use No  History of colostomy No  Incontinent No   BLADDER Normal In and out cath, frequency . Able to self cath  . Bladder incontinence No  Frequent urination No  Leakage with coughing No  Difficulty starting stream No  Incomplete bladder emptying No    Mobility walk with assistance use a walker how many minutes can you walk? 10 ability to climb steps?  no do you drive?  no  Function disabled: date disabled 03/2023 I need assistance with the following:  dressing, meal prep, and household duties  Neuro/Psych tremor trouble walking dizziness confusion  Prior Studies Any changes since last visit?  no  Physicians involved in your care Any changes since last visit?  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[employer]  Tobacco Use   Smoking status: Never   Smokeless tobacco: Never  Substance and Sexual Activity   Alcohol use: Never   Drug  use: Never   Sexual activity: Not Currently  Other Topics Concern   Not on file  Social History Narrative   Lives alone   Social Determinants of Health   Financial Resource Strain: Low Risk  (01/01/2023)   Received from [facility], [facility]   Overall Financial Resource Strain (CARDIA)    Difficulty of Paying Living Expenses: Not hard at all  Food Insecurity: No Food Insecurity (07/10/2023)   Hunger Vital Sign    Worried About Running Out of Food in the Last Year: Never true    Ran Out of Food in the Last Year: Never true  Transportation Needs: No Transportation Needs (07/10/2023)   PRAPARE - Administrator, Civil Service (Medical): No    Lack of Transportation (Non-Medical): No  Physical Activity: Not on file  Stress: Stress Concern Present (11/08/2022)   Received from [facility], [facility] - Occupational Stress Questionnaire    Feeling of Stress : To some extent  Social Connections: Unknown (03/02/2022)   Received from [facility], [facility]   Social Network    Social Network: Not on file   Past Surgical History:  Procedure Laterality Date   cardiac stents  2005   CHOLECYSTECTOMY  2007   IR CT HEAD LTD  12/05/2021   IR CT HEAD LTD  07/08/2023   IR PERCUTANEOUS ART THROMBECTOMY/INFUSION INTRACRANIAL INC DIAG ANGIO  12/05/2021   IR PERCUTANEOUS ART THROMBECTOMY/INFUSION INTRACRANIAL INC DIAG ANGIO  07/08/2023   IR US GUIDE VASC ACCESS RIGHT  07/08/2023   KIDNEY STONE SURGERY  1997-2003   RADIOLOGY WITH ANESTHESIA N/A 12/05/2021   Procedure: RADIOLOGY WITH ANESTHESIA;  Surgeon: Radiologist, Medication, MD;  Location: [facility] OR;  Service: Radiology;  Laterality: N/A;   RADIOLOGY WITH ANESTHESIA N/A 07/08/2023   Procedure: IR WITH ANESTHESIA;  Surgeon: Radiologist, Medication, MD;  Location: [facility] OR;  Service: Radiology;  Laterality: N/A;   Past Medical History:  Diagnosis Date   Acute bronchitis, unspecified 01/16/2022    Acute ischemic stroke (HCC) 12/05/2021   Arthritis    Atrial fibrillation (HCC)    Carpal tunnel syndrome 01/16/2022   Cerebral infarction, unspecified (HCC) 01/16/2022   Cerebrovascular accident [facility]) 01/16/2022   Dec 27, 2021 Entered By: [facility] D Comment: Left MCA stroke s/p thrombectomy, 12/05/2021.   Chronic atrial fibrillation (HCC) 09/17/2021   Coronary artery disease 01/16/2022   Dec 27, 2021 Entered By: [facility] D Comment: S/p stent Obtuse marginal 2, 10/2013.   Counseling, unspecified 01/16/2022   Diabetes mellitus without complication (HCC)    Essential (primary) hypertension 01/16/2022   Generalized weakness 09/17/2021   Hypercholesterolemia    Hyperglycemia due to diabetes mellitus (HCC) 09/17/2021   Hyperlipidemia 01/16/2022   Hypertension    Hyponatremia 09/17/2021   Kidney stones    Low back pain 01/16/2022   MI (myocardial infarction) (HCC)    Muscle weakness (generalized) 01/16/2022   Neck pain 01/16/2022   Non-traumatic rhabdomyolysis 09/19/2021   Special screening for malignant neoplasms, colon 11/30/2009   May 05, 2010 Entered By: [facility] D Comment: Normal per pt   Stroke (cerebrum) (HCC) 12/05/2021   Stroke (HCC) 2010   Thrombocytopenia (HCC) 09/18/2021   Traumatic rhabdomyolysis (HCC) 09/17/2021   Tremor    Ulnar neuropathy 01/16/2022   BP 123/73   Pulse 69   Ht 6' (1.829 m)   Wt 198 lb (89.8 kg)   SpO2 99%   BMI 26.85 kg/m   Opioid Risk Score:   Fall Risk Score:  `1  Depression screen [facility] 2/9     08/07/2023   10:14 AM  Depression screen PHQ 2/9  Decreased Interest 1  Down, Depressed, Hopeless 0  PHQ - 2 Score 1  Altered sleeping 2  Tired, decreased energy 3  Change in appetite 1  Feeling bad or failure about yourself  1  Trouble concentrating 1  Moving slowly or fidgety/restless 1  Suicidal thoughts 0  PHQ-9 Score 10  Difficult doing work/chores Somewhat difficult      Review of Systems  Musculoskeletal:  Positive for gait problem.   Neurological:  Positive for dizziness and tremors.  All other systems reviewed and are negative.     Objective:   Physical Exam Gen: no distress, normal appearing HEENT: oral mucosa pink and moist, NCAT Cardio: Reg rate Chest: normal effort, normal rate of breathing Abd: soft, non-distended Ext: no edema Psych: pleasant, normal affect Skin: intact Neuro: Alert and oriented x3 MSK: Right shoulder limited elevation     Assessment & Plan:  1) CVA -continue therapies -would benefit from handicap placard to increase mobility in the community -reviewed all medications and provided necessary refills. -discussed vagal nerve stimulation if strength fails to improve in 6 months.  -discussed that he  has had some falls  2) Right shoulder pain: -continue volateren gel -red light therapy -discussed collagen  3) Type 2 diabetes: -discussed that blood sugars have been elevated -increase metformin to 1,000mg  BID -discussed that CBGS are 120s-130s  4) Hypotension: -dicussed that blood pressure lowered when he was washing dishes -decreased amlodipine to 5mg  daily.   5) Chest pain: -discussed that this occurred while he was washing dishes  6) Lower extremity edema: -discussed that he has not been on lasix.  -decrease amlodipine to 5mg  daily.   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

## 2023-08-07 NOTE — Patient Instructions (Addendum)
Red light therapy: [facility]

## 2023-09-07 ENCOUNTER — Encounter: Payer: Medicare PPO | Attending: Physical Medicine and Rehabilitation | Admitting: Physical Medicine and Rehabilitation

## 2023-09-07 ENCOUNTER — Encounter: Payer: Self-pay | Admitting: Physical Medicine and Rehabilitation

## 2023-09-07 VITALS — BP 136/77 | HR 77 | Ht 72.0 in | Wt 200.0 lb

## 2023-09-07 DIAGNOSIS — G8929 Other chronic pain: Secondary | ICD-10-CM | POA: Insufficient documentation

## 2023-09-07 DIAGNOSIS — F32A Depression, unspecified: Secondary | ICD-10-CM | POA: Diagnosis not present

## 2023-09-07 DIAGNOSIS — Z823 Family history of stroke: Secondary | ICD-10-CM | POA: Insufficient documentation

## 2023-09-07 DIAGNOSIS — E119 Type 2 diabetes mellitus without complications: Secondary | ICD-10-CM | POA: Insufficient documentation

## 2023-09-07 DIAGNOSIS — R600 Localized edema: Secondary | ICD-10-CM | POA: Diagnosis not present

## 2023-09-07 DIAGNOSIS — R251 Tremor, unspecified: Secondary | ICD-10-CM | POA: Diagnosis not present

## 2023-09-07 DIAGNOSIS — Z833 Family history of diabetes mellitus: Secondary | ICD-10-CM | POA: Insufficient documentation

## 2023-09-07 DIAGNOSIS — Z79899 Other long term (current) drug therapy: Secondary | ICD-10-CM | POA: Insufficient documentation

## 2023-09-07 DIAGNOSIS — R079 Chest pain, unspecified: Secondary | ICD-10-CM | POA: Insufficient documentation

## 2023-09-07 DIAGNOSIS — M25511 Pain in right shoulder: Secondary | ICD-10-CM | POA: Insufficient documentation

## 2023-09-07 DIAGNOSIS — Z8673 Personal history of transient ischemic attack (TIA), and cerebral infarction without residual deficits: Secondary | ICD-10-CM | POA: Diagnosis not present

## 2023-09-07 DIAGNOSIS — G4701 Insomnia due to medical condition: Secondary | ICD-10-CM | POA: Diagnosis not present

## 2023-09-07 DIAGNOSIS — G4700 Insomnia, unspecified: Secondary | ICD-10-CM | POA: Insufficient documentation

## 2023-09-07 DIAGNOSIS — Z794 Long term (current) use of insulin: Secondary | ICD-10-CM

## 2023-09-07 DIAGNOSIS — E118 Type 2 diabetes mellitus with unspecified complications: Secondary | ICD-10-CM

## 2023-09-07 DIAGNOSIS — Z8249 Family history of ischemic heart disease and other diseases of the circulatory system: Secondary | ICD-10-CM | POA: Diagnosis not present

## 2023-09-07 DIAGNOSIS — I959 Hypotension, unspecified: Secondary | ICD-10-CM | POA: Diagnosis not present

## 2023-09-07 MED ORDER — TRAZODONE HCL 50 MG PO TABS: 50.0000 mg | ORAL_TABLET | Freq: Every day | ORAL | 3 refills | Status: DC

## 2023-09-07 NOTE — Progress Notes (Signed)
Subjective:    Patient ID: Cory Brown, male    DOB: 06/10/1951, 72 y.o.   MRN: [id number]  HPI Cory Brown is a 72 year old man who presents for follow-up of CVA and right shoulder pain.  1) Right shoulder pain: -injection did not help -nothing has been helpful -has tried red light therapy -does not take tylenol -heat helps -his son knows he does not want to go through surgery.   2) CVA -therapy is working on walking -approval for home therapy was sent by [facility] by [name] and his daughter-in-law  said [name] did a good job.   Pain Inventory Average Pain 6 Pain Right Now 5 My pain is sharp and aching  LOCATION OF PAIN  shoulder, knee, ankle, toes  BOWEL Number of stools per week: 6 Oral laxative use No  Type of laxative . Enema or suppository use No  History of colostomy No  Incontinent No   BLADDER Normal In and out cath, frequency . Able to self cath  . Bladder incontinence No  Frequent urination No  Leakage with coughing No  Difficulty starting stream No  Incomplete bladder emptying No    Mobility walk with assistance use a walker how many minutes can you walk? 10 ability to climb steps?  no do you drive?  no  Function disabled: date disabled 03/2023 I need assistance with the following:  dressing, meal prep, and household duties  Neuro/Psych tremor trouble walking dizziness confusion  Prior Studies Any changes since last visit?  no  Physicians involved in your care Any changes since last visit?  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[employer]  Tobacco Use   Smoking status:  Never   Smokeless tobacco: Never  Substance and Sexual Activity   Alcohol use: Never   Drug use: Never   Sexual activity: Not Currently  Other Topics Concern   Not on file  Social History Narrative   Lives alone   Social Determinants of Health   Financial Resource Strain: Low Risk  (01/01/2023)   Received from [facility], [facility]   Overall Financial Resource Strain (CARDIA)    Difficulty of Paying Living Expenses: Not hard at all  Food Insecurity: No Food Insecurity (07/10/2023)   Hunger Vital Sign    Worried About Running Out of Food in the Last Year: Never true    Ran Out of Food in the Last Year: Never true  Transportation Needs: No Transportation Needs (07/10/2023)   PRAPARE - Administrator, Civil Service (Medical): No    Lack of Transportation (Non-Medical): No  Physical Activity: Not on file  Stress: Stress Concern Present (11/08/2022)   Received from [facility], [facility] - Occupational Stress Questionnaire    Feeling of Stress : To some extent  Social Connections: Unknown (03/02/2022)   Received from [facility], [facility]   Social Network    Social Network: Not on file   Past Surgical  History:  Procedure Laterality Date   cardiac stents  2005   CHOLECYSTECTOMY  2007   IR CT HEAD LTD  12/05/2021   IR CT HEAD LTD  07/08/2023   IR PERCUTANEOUS ART THROMBECTOMY/INFUSION INTRACRANIAL INC DIAG ANGIO  12/05/2021   IR PERCUTANEOUS ART THROMBECTOMY/INFUSION INTRACRANIAL INC DIAG ANGIO  07/08/2023   IR US GUIDE VASC ACCESS RIGHT  07/08/2023   KIDNEY STONE SURGERY  1997-2003   RADIOLOGY WITH ANESTHESIA N/A 12/05/2021   Procedure: RADIOLOGY WITH ANESTHESIA;  Surgeon: Radiologist, Medication, MD;  Location: [facility] OR;  Service: Radiology;  Laterality: N/A;   RADIOLOGY WITH ANESTHESIA N/A 07/08/2023   Procedure: IR WITH ANESTHESIA;  Surgeon: Radiologist, Medication, MD;  Location: [facility] OR;  Service: Radiology;  Laterality:  N/A;   Past Medical History:  Diagnosis Date   Acute bronchitis, unspecified 01/16/2022   Acute ischemic stroke (HCC) 12/05/2021   Arthritis    Atrial fibrillation (HCC)    Carpal tunnel syndrome 01/16/2022   Cerebral infarction, unspecified (HCC) 01/16/2022   Cerebrovascular accident [facility]) 01/16/2022   Dec 27, 2021 Entered By: [facility] D Comment: Left MCA stroke s/p thrombectomy, 12/05/2021.   Chronic atrial fibrillation (HCC) 09/17/2021   Coronary artery disease 01/16/2022   Dec 27, 2021 Entered By: [facility] D Comment: S/p stent Obtuse marginal 2, 10/2013.   Counseling, unspecified 01/16/2022   Diabetes mellitus without complication (HCC)    Essential (primary) hypertension 01/16/2022   Generalized weakness 09/17/2021   Hypercholesterolemia    Hyperglycemia due to diabetes mellitus (HCC) 09/17/2021   Hyperlipidemia 01/16/2022   Hypertension    Hyponatremia 09/17/2021   Kidney stones    Low back pain 01/16/2022   MI (myocardial infarction) [facility])    Muscle weakness (generalized) 01/16/2022   Neck pain 01/16/2022   Non-traumatic rhabdomyolysis 09/19/2021   Special screening for malignant neoplasms, colon 11/30/2009   May 05, 2010 Entered By: [facility] D Comment: Normal per pt   Stroke (cerebrum) (HCC) 12/05/2021   Stroke (HCC) 2010   Thrombocytopenia (HCC) 09/18/2021   Traumatic rhabdomyolysis (HCC) 09/17/2021   Tremor    Ulnar neuropathy 01/16/2022   There were no vitals taken for this visit.  Opioid Risk Score:   Fall Risk Score:  `1  Depression screen [facility] 2/9     08/07/2023   10:14 AM  Depression screen PHQ 2/9  Decreased Interest 1  Down, Depressed, Hopeless 0  PHQ - 2 Score 1  Altered sleeping 2  Tired, decreased energy 3  Change in appetite 1  Feeling bad or failure about yourself  1  Trouble concentrating 1  Moving slowly or fidgety/restless 1  Suicidal thoughts 0  PHQ-9 Score 10  Difficult doing work/chores Somewhat difficult      Review of Systems   Musculoskeletal:  Positive for gait problem.  Neurological:  Positive for dizziness and tremors.  All other systems reviewed and are negative.      Objective:   Physical Exam Gen: no distress, normal appearing, BMI 27.12 HEENT: oral mucosa pink and moist, NCAT Cardio: Reg rate Chest: normal effort, normal rate of breathing Abd: soft, non-distended Ext: no edema Psych: pleasant, normal affect Skin: intact Neuro: Alert and oriented x3 MSK: Right shoulder limited elevation, TTP over glenohumeral joint pain     Assessment & Plan:  1) CVA -continue therapies -would benefit from handicap placard to increase mobility in the community -reviewed all medications and provided necessary refills. -discussed vagal nerve stimulation if strength fails to improve in 6 months.  -discussed  that he has had some falls  2) Right shoulder pain: -continue volateren gel -red light therapy, continue -discussed collagen -discussed limited range of motion -discussed NSAIDs are contraindicated in the setting of stroke -Discussed current symptoms of pain and history of pain.  -Discussed benefits of exercise in reducing pain. -Discussed following foods that may reduce pain: 1) Ginger (especially studied for arthritis)- reduce leukotriene production to decrease inflammation 2) Blueberries- high in phytonutrients that decrease inflammation 3) Salmon- marine omega-3s reduce joint swelling and pain 4) Pumpkin seeds- reduce inflammation 5) dark chocolate- reduces inflammation 6) turmeric- reduces inflammation 7) tart cherries - reduce pain and stiffness 8) extra virgin olive oil - its compound olecanthal helps to block prostaglandins  9) chili peppers- can be eaten or applied topically via capsaicin 10) mint- helpful for headache, muscle aches, joint pain, and itching 11) garlic- reduces inflammation  Link to further information on diet for chronic pain:  [email or web address]   3) Type 2 diabetes: -discussed that blood sugars have been elevated -increase metformin to 1,000mg  BID -discussed that CBGS are 120s-130s -discussed that steroids can increase blood sugar -continue metofrmin, discussed longevity benefits of this medicine.   4) Hypotension: -dicussed that blood pressure lowered when he was washing dishes -decreased amlodipine to 5mg  daily.  -discussed BP is 136/77 today  5) Chest pain: -discussed that this occurred while he was washing dishes  6) Lower extremity edema: -discussed that he has not been on lasix.  -decrease amlodipine to 5mg  daily.  -discussed that red light therapy helped  7) Insomnia: -gabapentin 100mg  HS.  -trazodone 50mg  HS  8) Depression:  Start trazodone 50mg  HS  -discussed that his depression his situational regarding his condition.

## 2023-09-07 NOTE — Patient Instructions (Signed)
Foods that may reduce pain: 1) Ginger (especially studied for arthritis)- reduce leukotriene production to decrease inflammation 2) Blueberries- high in phytonutrients that decrease inflammation 3) Salmon- marine omega-3s reduce joint swelling and pain 4) Pumpkin seeds- reduce inflammation 5) dark chocolate- reduces inflammation 6) turmeric- reduces inflammation 7) tart cherries - reduce pain and stiffness 8) extra virgin olive oil - its compound olecanthal helps to block prostaglandins  9) chili peppers- can be eaten or applied topically via capsaicin 10) mint- helpful for headache, muscle aches, joint pain, and itching 11) garlic- reduces inflammation  Link to further information on diet for chronic pain: [email or web address]

## 2023-10-01 ENCOUNTER — Encounter: Payer: Self-pay | Admitting: Physical Medicine and Rehabilitation

## 2023-10-01 ENCOUNTER — Other Ambulatory Visit: Payer: Self-pay

## 2023-10-01 ENCOUNTER — Other Ambulatory Visit: Payer: Self-pay | Admitting: Physical Medicine and Rehabilitation

## 2023-10-01 ENCOUNTER — Other Ambulatory Visit (HOSPITAL_COMMUNITY): Payer: Self-pay

## 2023-10-01 ENCOUNTER — Inpatient Hospital Stay: Payer: Medicare PPO | Admitting: Diagnostic Neuroimaging

## 2023-10-01 MED ORDER — PROPRANOLOL HCL 10 MG PO TABS
10.0000 mg | ORAL_TABLET | Freq: Every day | ORAL | 0 refills | Status: AC
  Filled 2023-10-01: qty 30, 30d supply, fill #0

## 2023-10-01 MED ORDER — MAGNESIUM OXIDE 400 MG PO TABS
200.0000 mg | ORAL_TABLET | Freq: Every day | ORAL | 0 refills | Status: DC
  Filled 2023-10-01: qty 30, 60d supply, fill #0

## 2023-10-01 MED ORDER — VITAMIN D (ERGOCALCIFEROL) 1.25 MG (50000 UNIT) PO CAPS
50000.0000 [IU] | ORAL_CAPSULE | ORAL | 0 refills | Status: AC
  Filled 2023-10-01: qty 5, 35d supply, fill #0

## 2023-10-01 MED ORDER — TRAZODONE HCL 50 MG PO TABS: 50.0000 mg | ORAL_TABLET | Freq: Every day | ORAL | 3 refills | Status: DC

## 2023-10-02 ENCOUNTER — Other Ambulatory Visit: Payer: Self-pay

## 2023-10-02 ENCOUNTER — Other Ambulatory Visit (HOSPITAL_COMMUNITY): Payer: Self-pay

## 2023-10-17 ENCOUNTER — Other Ambulatory Visit: Payer: Self-pay

## 2023-10-17 NOTE — Patient Outreach (Signed)
Telephone outreach to patient's son to obtain mRS was successfully completed. MRS=3  Vanice Sarah Care Management Assistant [phone]

## 2023-12-10 ENCOUNTER — Encounter: Payer: Medicare PPO | Attending: Physical Medicine and Rehabilitation | Admitting: Physical Medicine and Rehabilitation

## 2024-04-13 NOTE — Progress Notes (Signed)
 vi

## 2024-05-29 ENCOUNTER — Emergency Department (HOSPITAL_COMMUNITY)

## 2024-05-29 ENCOUNTER — Other Ambulatory Visit: Payer: Self-pay

## 2024-05-29 ENCOUNTER — Emergency Department (HOSPITAL_COMMUNITY)
Admission: EM | Admit: 2024-05-29 | Discharge: 2024-05-30 | Disposition: A | Discharge status: Home / Self Care | Attending: Emergency Medicine | Admitting: Emergency Medicine

## 2024-05-29 DIAGNOSIS — N179 Acute kidney failure, unspecified: Secondary | ICD-10-CM | POA: Insufficient documentation

## 2024-05-29 DIAGNOSIS — E119 Type 2 diabetes mellitus without complications: Secondary | ICD-10-CM | POA: Diagnosis not present

## 2024-05-29 DIAGNOSIS — I2510 Atherosclerotic heart disease of native coronary artery without angina pectoris: Secondary | ICD-10-CM | POA: Diagnosis not present

## 2024-05-29 DIAGNOSIS — R29704 NIHSS score 4: Secondary | ICD-10-CM | POA: Diagnosis not present

## 2024-05-29 DIAGNOSIS — R4701 Aphasia: Secondary | ICD-10-CM | POA: Insufficient documentation

## 2024-05-29 DIAGNOSIS — Z8673 Personal history of transient ischemic attack (TIA), and cerebral infarction without residual deficits: Secondary | ICD-10-CM | POA: Insufficient documentation

## 2024-05-29 DIAGNOSIS — Z950 Presence of cardiac pacemaker: Secondary | ICD-10-CM | POA: Insufficient documentation

## 2024-05-29 DIAGNOSIS — I69320 Aphasia following cerebral infarction: Secondary | ICD-10-CM

## 2024-05-29 DIAGNOSIS — I639 Cerebral infarction, unspecified: Secondary | ICD-10-CM | POA: Diagnosis not present

## 2024-05-29 DIAGNOSIS — I10 Essential (primary) hypertension: Secondary | ICD-10-CM | POA: Diagnosis not present

## 2024-05-29 DIAGNOSIS — W19XXXA Unspecified fall, initial encounter: Secondary | ICD-10-CM | POA: Diagnosis not present

## 2024-05-29 LAB — CBC
HCT: 47.7 % (ref 39.0–52.0)
Hemoglobin: 16.5 g/dL (ref 13.0–17.0)
MCH: 33.4 pg (ref 26.0–34.0)
MCHC: 34.6 g/dL (ref 30.0–36.0)
MCV: 96.6 fL (ref 80.0–100.0)
Platelets: 165 K/uL (ref 150–400)
RBC: 4.94 MIL/uL (ref 4.22–5.81)
RDW: 12.8 % (ref 11.5–15.5)
WBC: 4.9 K/uL (ref 4.0–10.5)
nRBC: 0 % (ref 0.0–0.2)

## 2024-05-29 LAB — COMPREHENSIVE METABOLIC PANEL WITH GFR
ALT: 21 U/L (ref 0–44)
AST: 19 U/L (ref 15–41)
Albumin: 4.2 g/dL (ref 3.5–5.0)
Alkaline Phosphatase: 81 U/L (ref 38–126)
Anion gap: 9 (ref 5–15)
BUN: 19 mg/dL (ref 8–23)
CO2: 24 mmol/L (ref 22–32)
Calcium: 9.5 mg/dL (ref 8.9–10.3)
Chloride: 105 mmol/L (ref 98–111)
Creatinine, Ser: 1.35 mg/dL — ABNORMAL HIGH (ref 0.61–1.24)
GFR, Estimated: 55 mL/min — ABNORMAL LOW (ref >60)
Glucose, Bld: 115 mg/dL — ABNORMAL HIGH (ref 70–99)
Potassium: 4.2 mmol/L (ref 3.5–5.1)
Sodium: 138 mmol/L (ref 135–145)
Total Bilirubin: 0.7 mg/dL (ref 0.0–1.2)
Total Protein: 7.1 g/dL (ref 6.5–8.1)

## 2024-05-29 LAB — I-STAT CHEM 8, ED
BUN: 22 mg/dL (ref 8–23)
Calcium, Ion: 1.13 mmol/L — ABNORMAL LOW (ref 1.15–1.40)
Chloride: 104 mmol/L (ref 98–111)
Creatinine, Ser: 1.2 mg/dL (ref 0.61–1.24)
Glucose, Bld: 107 mg/dL — ABNORMAL HIGH (ref 70–99)
HCT: 49 % (ref 39.0–52.0)
Hemoglobin: 16.7 g/dL (ref 13.0–17.0)
Potassium: 4.3 mmol/L (ref 3.5–5.1)
Sodium: 139 mmol/L (ref 135–145)
TCO2: 23 mmol/L (ref 22–32)

## 2024-05-29 LAB — DIFFERENTIAL
Abs Immature Granulocytes: 0.02 K/uL (ref 0.00–0.07)
Basophils Absolute: 0.1 K/uL (ref 0.0–0.1)
Basophils Relative: 1 %
Eosinophils Absolute: 0.2 K/uL (ref 0.0–0.5)
Eosinophils Relative: 3 %
Immature Granulocytes: 0 %
Lymphocytes Relative: 32 %
Lymphs Abs: 1.6 K/uL (ref 0.7–4.0)
Monocytes Absolute: 0.5 K/uL (ref 0.1–1.0)
Monocytes Relative: 10 %
Neutro Abs: 2.6 K/uL (ref 1.7–7.7)
Neutrophils Relative %: 54 %

## 2024-05-29 LAB — CBG MONITORING, ED: Glucose-Capillary: 118 mg/dL — ABNORMAL HIGH (ref 70–99)

## 2024-05-29 LAB — PROTIME-INR
INR: 1.4 — ABNORMAL HIGH (ref 0.8–1.2)
Prothrombin Time: 17.8 s — ABNORMAL HIGH (ref 11.4–15.2)

## 2024-05-29 LAB — APTT: aPTT: 49 s — ABNORMAL HIGH (ref 24–36)

## 2024-05-29 LAB — ETHANOL: Alcohol, Ethyl (B): <15 mg/dL (ref <15)

## 2024-05-29 MED ORDER — METFORMIN HCL 500 MG PO TABS
1000.0000 mg | ORAL_TABLET | Freq: Two times a day (BID) | ORAL | Status: DC
  Administered 2024-05-30 (×2): 1000 mg via ORAL
  Filled 2024-05-29 (×2): qty 2

## 2024-05-29 MED ORDER — PROPRANOLOL HCL 10 MG PO TABS
10.0000 mg | ORAL_TABLET | Freq: Every day | ORAL | Status: DC
  Administered 2024-05-30: 10 mg via ORAL
  Filled 2024-05-29: qty 1

## 2024-05-29 MED ORDER — IOHEXOL 350 MG/ML SOLN
75.0000 mL | Freq: Once | INTRAVENOUS | Status: AC | PRN
  Administered 2024-05-29: 75 mL via INTRAVENOUS

## 2024-05-29 MED ORDER — HYDRALAZINE HCL 10 MG PO TABS
10.0000 mg | ORAL_TABLET | Freq: Once | ORAL | Status: AC
  Administered 2024-05-30: 10 mg via ORAL
  Filled 2024-05-29: qty 1

## 2024-05-29 MED ORDER — LACTATED RINGERS IV BOLUS
1000.0000 mL | Freq: Once | INTRAVENOUS | Status: AC
  Administered 2024-05-29: 1000 mL via INTRAVENOUS

## 2024-05-29 NOTE — Code Documentation (Signed)
 Stroke Response Nurse Documentation Code Documentation  Cory Brown is a 73 y.o. male arriving to [facility] by POV today s/p fall yesterday after family noted worsening aphasia. Patient history of previous stroke with residual aphasia, takes Pradaxxa daily. LKW 1330. Stroke activated triage.   Stroke team met patient in CT upon activation. NIH 4, see flowsheet for details. CT/CTA completed. Minor delay due to no access. No TNK as OOW and on pradaxa . No thrombectomy as no LVO. Care Plan: NIH and vitals q2h x12h then per unit routine. Bedside handoff with ED RN Camie.    Tonna Lacks K  Rapid Response RN

## 2024-05-29 NOTE — ED Triage Notes (Signed)
 Pt came in via POV d/t falling yesterday from standing & landing on his bath on top of a trash can. Does take a thinner from Hx of stroke back on September 7th, with baseline deficits of Rt sided weakness & some orientation confusion with time. Son reports he has bruising to his upper back with no obvious broken bones (per family). Unknown if he hit his head, but does not see any head injury. Could not stand post fall but was able to stand when getting into his vehicle today. Daughter reports she noticed at 1330 he had some worsening of his speech & confusion. During triage baseline Rt sided weakness noted, no drift seen. Can confirm he was confused was only oriented to self & had aphasia. Code Stroke called.

## 2024-05-29 NOTE — Consult Note (Signed)
 NEUROLOGY CONSULT NOTE   Date of service: May 29, 2024 Patient Name: Cory Brown MRN:  [id number] DOB:  December 20, 1950 Chief Complaint: aphasia Requesting Provider: Doretha Folks, MD  History of Present Illness  Cory Brown is a 73 y.o. 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 He is on pradaxa  for afibb and compliant. Had a fall last night and was fine. Family kept cheking on him and noted worsening of baselien aphasia today around 1330. He was brought in to the ED and a code stroke was activated.  LKW: 1330 Modified rankin score: 2-Slight disability-UNABLE to perform all activities but does not need assistance IV Thrombolysis: not offered, patient is on pradaxa . EVT: not offered 2/2 no LVO  NIHSS components Score: Comment  1a Level of Conscious 0[x]  1[]  2[]  3[]      1b LOC Questions 0[x]  1[]  2[]       1c LOC Commands 0[x]  1[]  2[]       2 Best Gaze 0[x]  1[]  2[]       3 Visual 0[x]  1[]  2[]  3[]      4 Facial Palsy 0[x]  1[]  2[]  3[]      5a Motor Arm - left 0[x]  1[]  2[]  3[]  4[]  UN[]    5b Motor Arm - Right 0[x]  1[]  2[]  3[]  4[]  UN[]    6a Motor Leg - Left 0[x]  1[]  2[]  3[]  4[]  UN[]    6b Motor Leg - Right 0[x]  1[]  2[]  3[]  4[]  UN[]    7 Limb Ataxia 0[]  1[]  2[x]  UN[]      8 Sensory 0[x]  1[]  2[]  UN[]      9 Best Language 0[]  1[]  2[x]  3[]      10 Dysarthria 0[x]  1[]  2[]  UN[]      11 Extinct. and Inattention 0[x]  1[]  2[]       TOTAL: 4      ROS  Unable to ascertain due to aphasia.  Past History   Past Medical History:  Diagnosis Date   Acute bronchitis, unspecified 01/16/2022   Acute ischemic stroke (HCC) 12/05/2021   Arthritis    Atrial fibrillation (HCC)    Carpal tunnel syndrome 01/16/2022   Cerebral infarction, unspecified (HCC) 01/16/2022   Cerebrovascular accident [facility]) 01/16/2022   Dec 27, 2021 Entered By: [facility] D Comment: Left MCA stroke s/p thrombectomy, 12/05/2021.    Chronic atrial fibrillation (HCC) 09/17/2021   Coronary artery disease 01/16/2022   Dec 27, 2021 Entered By: [facility] D Comment: S/p stent Obtuse marginal 2, 10/2013.   Counseling, unspecified 01/16/2022   Diabetes mellitus without complication (HCC)    Essential (primary) hypertension 01/16/2022   Generalized weakness 09/17/2021   Hypercholesterolemia    Hyperglycemia due to diabetes mellitus (HCC) 09/17/2021   Hyperlipidemia 01/16/2022   Hypertension    Hyponatremia 09/17/2021   Kidney stones    Low back pain 01/16/2022   MI (myocardial infarction) [facility])    Muscle weakness (generalized) 01/16/2022   Neck pain 01/16/2022   Non-traumatic rhabdomyolysis 09/19/2021   Special screening for malignant neoplasms, colon 11/30/2009   May 05, 2010 Entered By: [facility] D Comment: Normal per pt   Stroke (cerebrum) (HCC) 12/05/2021   Stroke (HCC) 2010   Thrombocytopenia (HCC) 09/18/2021   Traumatic rhabdomyolysis (HCC) 09/17/2021   Tremor    Ulnar neuropathy 01/16/2022    Past Surgical History:  Procedure Laterality Date   cardiac stents  2005   CHOLECYSTECTOMY  2007   IR CT HEAD LTD  12/05/2021   IR CT HEAD LTD  07/08/2023   IR PERCUTANEOUS ART THROMBECTOMY/INFUSION INTRACRANIAL INC DIAG ANGIO  12/05/2021   IR PERCUTANEOUS ART THROMBECTOMY/INFUSION INTRACRANIAL INC DIAG ANGIO  07/08/2023   IR US  GUIDE VASC ACCESS RIGHT  07/08/2023   KIDNEY STONE SURGERY  1997-2003   RADIOLOGY WITH ANESTHESIA N/A 12/05/2021   Procedure: RADIOLOGY WITH ANESTHESIA;  Surgeon: Radiologist, Medication, MD;  Location: [facility] OR;  Service: Radiology;  Laterality: N/A;   RADIOLOGY WITH ANESTHESIA N/A 07/08/2023   Procedure: IR WITH ANESTHESIA;  Surgeon: Radiologist, Medication, MD;  Location: [facility] OR;  Service: Radiology;  Laterality: N/A;    Family History: Family History  Problem Relation Age of Onset   Hypertension Mother    Stroke Mother    Diabetes Father    Cancer Sister    Diabetes Brother    Cancer Brother      Social History  reports that he has never smoked. He has never used smokeless tobacco. He reports that he does not drink alcohol  and does not use drugs.  Allergies  Allergen Reactions   Acetaminophen      Medications  No current facility-administered medications for this encounter.  Current Outpatient Medications:    amLODipine  (NORVASC ) 5 MG tablet, Take 1 tablet (5 mg total) by mouth daily., Disp: 90 tablet, Rfl: 3   dabigatran  (PRADAXA ) 150 MG CAPS capsule, Take 1 capsule (150 mg total) by mouth every 12 (twelve) hours., Disp: , Rfl:    diclofenac  Sodium (VOLTAREN ) 1 % GEL, Apply 2 g topically 3 (three) times daily., Disp: 350 g, Rfl: 0   gabapentin  (NEURONTIN ) 100 MG capsule, Take 100 mg by mouth daily., Disp: , Rfl:    HYDROcodone -acetaminophen  (NORCO/VICODIN) 5-325 MG tablet, Take 1 tablet by mouth daily., Disp: 30 tablet, Rfl: 0   magnesium  oxide (MAG-OX) 400 MG tablet, Take 0.5 tablets (200 mg total) by mouth at bedtime., Disp: 30 tablet, Rfl: 0   Menthol -Methyl Salicylate  (MUSCLE RUB) 10-15 % CREA, Apply 1 Application topically 3 (three) times daily., Disp: 85 g, Rfl: 0   metFORMIN  (GLUCOPHAGE ) 1000 MG tablet, Take 1 tablet (1,000 mg total) by mouth 2 (two) times daily with a meal., Disp: 180 tablet, Rfl: 3   metoprolol  succinate (TOPROL -XL) 100 MG 24 hr tablet, Take 1 tablet (100 mg total) by mouth daily. Take with or immediately following a meal., Disp: , Rfl:    nitroGLYCERIN  (NITROSTAT ) 0.4 MG SL tablet, Place 1 tablet (0.4 mg total) under the tongue every 5 (five) minutes as needed for chest pain., Disp: 90 tablet, Rfl: 12   propranolol  (INDERAL ) 10 MG tablet, Take 1 tablet (10 mg total) by mouth daily., Disp: 30 tablet, Rfl: 0   rosuvastatin  (CRESTOR ) 10 MG tablet, Take 1 tablet (10 mg total) by mouth daily., Disp: , Rfl:    senna-docusate (SENOKOT-S) 8.6-50 MG tablet, Take 1 tablet by mouth at bedtime as needed for mild constipation., Disp: , Rfl:    traZODone  (DESYREL )  50 MG tablet, Take 1 tablet (50 mg total) by mouth at bedtime., Disp: 90 tablet, Rfl: 3   Vitamin D , Ergocalciferol , (DRISDOL ) 1.25 MG (50000 UNIT) CAPS capsule, Take 1 capsule (50,000 Units total) by mouth every 7 (seven) days., Disp: 5 capsule, Rfl: 0  Vitals   Vitals:   05/29/24 1721 05/29/24 1745  BP: (!) 162/91   Pulse: 72   Resp: 16   Temp: 98.5 F (36.9 C)   SpO2: 100%   Weight:  93 kg  Height:  6' (1.829 m)    Body mass index is 27.8 kg/m.   Physical Exam   General: Laying comfortably in bed; in no acute distress.  HENT: Normal oropharynx and mucosa. Normal external appearance of ears and nose.  Neck: Supple, no pain or tenderness  CV: No JVD. No peripheral edema.  Pulmonary: Symmetric Chest rise. Normal respiratory effort.  Abdomen: Soft to touch, non-tender.  Ext: No cyanosis, edema, or deformity  Skin: No rash. Normal palpation of skin.   Musculoskeletal: Normal digits and nails by inspection. No clubbing.   Neurologic Examination  Mental status/Cognition: Alert, oriented to self, place, month and year, good attention.  Speech/language: Fluent, comprehension intact. However struggles with object naming and only able to name a couple objects out of several presented with paraphasic errors Cranial nerves:   CN II Pupils equal and reactive to light, no VF deficits    CN III,IV,VI EOM intact, no gaze preference or deviation, no nystagmus    CN V normal sensation in V1, V2, and V3 segments bilaterally    CN VII no asymmetry, no nasolabial fold flattening    CN VIII normal hearing to speech    CN IX & X normal palatal elevation, no uvular deviation    CN XI 5/5 head turn and 5/5 shoulder shrug bilaterally    CN XII midline tongue protrusion    Motor:  Muscle bulk: normal, tone slightly increased in RUE., pronator drift noted in RUE. Mvmt Root Nerve  Muscle Right Left Comments  SA C5/6 Ax Deltoid 5 5   EF C5/6 Mc Biceps 5 5   EE C6/7/8 Rad Triceps 5 5   WF C6/7  Med FCR     WE C7/8 PIN ECU     F Ab C8/T1 U ADM/FDI 4+ 5   HF L1/2/3 Fem Illopsoas 4 5   KE L2/3/4 Fem Quad     DF L4/5 D Peron Tib Ant 5 5   PF S1/2 Tibial Grc/Sol 5 5    Sensation:  Light touch Intact throughout   Pin prick    Temperature    Vibration   Proprioception    Coordination/Complex Motor:  - Finger to Nose intact BL - Heel to shin uanble to do due to pain - Rapid alternating movement are slowed on the right - Gait: deferred.  Labs/Imaging/Neurodiagnostic studies   CBC: No results for input(s): WBC, NEUTROABS, HGB, HCT, MCV, PLT in the last 168 hours. Basic Metabolic Panel:  Lab Results  Component Value Date   NA 135 07/23/2023   K 3.9 07/23/2023   CO2 24 07/23/2023   GLUCOSE 134 (H) 07/23/2023   BUN 31 (H) 07/23/2023   CREATININE 1.16 07/23/2023   CALCIUM  9.1 07/23/2023   GFRNONAA >60 07/23/2023   GFRAA >90 01/07/2014   Lipid Panel:  Lab Results  Component Value Date   LDLCALC 39 07/09/2023   HgbA1c:  Lab Results  Component Value Date   HGBA1C 7.1 (H) 07/08/2023   Urine Drug Screen:     Component Value Date/Time   LABOPIA NONE DETECTED 07/08/2023 2043   COCAINSCRNUR NONE DETECTED 07/08/2023 2043   LABBENZ NONE DETECTED 07/08/2023 2043   AMPHETMU NONE DETECTED 07/08/2023 2043   THCU NONE DETECTED 07/08/2023 2043   LABBARB NONE DETECTED 07/08/2023 2043    Alcohol  Level     Component Value Date/Time   ETH <10 07/08/2023 1555   INR  Lab Results  Component Value Date   INR 1.2 07/08/2023   APTT  Lab Results  Component Value Date   APTT 32 07/08/2023   AED levels: No results found for: PHENYTOIN, ZONISAMIDE, LAMOTRIGINE, LEVETIRACETA  CT Head without contrast(Personally reviewed): CTH was negative for a large hypodensity concerning for a large territory infarct or hyperdensity concerning for an ICH.  CT angio Head and Neck with contrast(Personally reviewed): No LVO  MRI Brain: pending   ASSESSMENT    Cory Brown is a 73 y.o. 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 Had a fall last night and seemed fine. Family had been checking on him,  He is not a candidate for tnkase as he is on pradaxa , he is not a candidate for thrombectomy due to no LVO.  His exam to me seems similar to prior documented neuro exam.  Suspect recrudescence of prior infarct vs a new acute stroke.  RECOMMENDATIONS  - MRI Brain w/o contrast. If negative for stroke, no further workup and can be discharged home from a neuro standpoint. If positive for stroke, admit for full stroke workup. ______________________________________________________________________    Signed, Cory Pimenta, MD [facility]

## 2024-05-29 NOTE — ED Provider Triage Note (Signed)
 Emergency Medicine Provider Triage Evaluation Note  Cory Brown , a 73 y.o. male  was evaluated in triage.  Pt complains of fell yesterady on trash can hitting back, did not hit head, on dabigatran  with no known reported missed doses. Hx of stroke with previous speech deficits and R sided weakness. Noted to have  worsening aphasia at 13:30 by daughter in law. Noted to have worsening confusion as well. Patient demonstrating worsening aphasia. Alert and oriented to self, unable provide correct time, correct event, and correct place.  Endorses headache  Denies n/v Hx provided by son.    Review of Systems  Positive: N/a Negative: N/a  Physical Exam  BP (!) 162/91   Pulse 72   Temp 98.5 F (36.9 C)   Resp 16   SpO2 100%  Gen:   Awake, no distress   Resp:  Normal effort  MSK:   Moves extremities without difficulty  Other:  A/ox1 to person. No facial asymmetry, no arm drift, normal coordination with finger-to-nose, normal sensation to both upper and lower extremities bilaterally, normal grip strength bilaterally, normal strength to both flexion and extension to both upper lower extremities 5+ bilaterally, no visual field deficits, no nystagmus.   Medical Decision Making  Medically screening exam initiated at 5:38 PM.  Appropriate orders placed.  RASHEEN BELLS was informed that the remainder of the evaluation will be completed by another provider, this initial triage assessment does not replace that evaluation, and the importance of remaining in the ED until their evaluation is complete.     Beola Terrall RAMAN, [place] 05/29/24 1756

## 2024-05-29 NOTE — ED Provider Notes (Signed)
 [place] EMERGENCY DEPARTMENT AT [facility] Provider Note  MDM   HPI/ROS:  Cory Brown is a 73 y.o. male with a medical history as below including 2 CVA at this last year who presents progressive weakness for the last few weeks and acute onset aphasia that started approximately 1130 today per family.  They report that he sometimes has intermittent bouts of aphasia or word finding difficulty since his strokes however he was much more persistent today and seem to start abruptly at approximately 1130.  [facility] department.  Physical exam is notable for: - Mild intermittent aphasia, 4/5 strength right lower extremity compared to left.  No facial droop  On my initial evaluation, patient is:  -Vital signs stable. Patient afebrile, hemodynamically stable, and non-toxic appearing. -Additional history obtained from family at bedside  This patient's current presentation, including their history and physical exam, is most consistent with CVA versus TIA versus chronic deconditioning/residual stroke deficit. Differentials include Hypoxic encephalopathy, metabolic encephalopathy, hypoglycemia, electrolyte abnormalities, hepatic encephalopathy, uremia, endocrine abnormality, CO2 narcosis, hypertensive encephalopathy, toxins/intoxication, alcohol  withdrawal, drug reactions, TTP, vitamin deficiency, sepsis, meningitis, trauma, IPH, SAH, SDH, diffuse axonal injury, stroke, encephalitis, seizure, dementia, psychosis, delirium, hypo/hyperthermia.   On my initial exam patient is hemodynamically stable.  His neurologic exam with some aphasia/word finding difficulties.  But otherwise fluid and incomprehensible speech.  He has 4 out of 5 strength in right lower extremity compared to left.  Retained bilateral upper extremity strength.  Neurology did see this patient on arrival as he was a code stroke.  Please see their note for full details however  in brief they recommend MRI given CT scans reassuring as below.  Patient does have a pacemaker so will require pacer rep for MRI.  Will plan to do this in the morning.  If the MRI is negative he is cleared from neurology standpoint.  Given his progressive weakness will place PT and OT eval here from the ED as he may be able to get this in the morning while awaiting MRI.  He remained hemodynamically intact and neurologically unchanged while under my care.  Handoff including patient's plan for care was given to the oncoming team all questions answered the best my ability.  Interpretations, interventions, and the patient's course of care are documented below.    Clinical Course as of 05/30/24 1812  Thu May 29, 2024  1813 CBC unremarkable.  Particular no leukocytosis or anemia [RC]  1813 Calcium  Ionized(!): 1.13 [RC]  1838 CT ANGIO HEAD NECK W WO CM (CODE STROKE) No acute critical stenosis [RC]  1838 CT HEAD CODE STROKE WO CONTRAST No acute LVO [RC]  1839 Creatinine(!): 1.35 Mild AKI.  IVF given [RC]  2244 EKG 12-Lead Normal rate ventricularly paced rhythm, no Sgarbossa [RC]  2244 Alcohol , Ethyl (B): <15 [RC]  Fri May 30, 2024  0659 R weakness, code stroke. MRI neg then dc versus f/u w neuro [  ] [AG]  1616 Mri soon [RC]  1812 MR BRAIN WO CONTRAST No acute intracranial abnormality [RC]    Clinical Course User Index [AG] Nada Chroman, DO [RC] Sharyne Darina RAMAN, MD      Disposition: Pending  Clinical Impression:  1. Aphasia   2. Fall, initial encounter      The plan for this patient was discussed with Dr. Doretha, who voiced agreement and who oversaw evaluation and treatment of this patient.   Clinical Complexity  A medically appropriate history, review of systems, and physical exam was performed.  My independent interpretations of EKG, labs, and radiology are documented in the ED course above.   If decision rules were used in this patient's evaluation, they are listed below.    Click here for ABCD2, HEART and other calculatorsREFRESH Note before signing   Patient's presentation is most consistent with acute presentation with potential threat to life or bodily function.  Medical Decision Making Amount and/or Complexity of Data Reviewed Labs: ordered. Decision-making details documented in ED Course. Radiology: ordered. Decision-making details documented in ED Course.    HPI/ROS      See MDM section for pertinent HPI and ROS. A complete ROS was performed with pertinent positives/negatives noted above.   Past Medical History:  Diagnosis Date  . Acute bronchitis, unspecified 01/16/2022  . Acute ischemic stroke (HCC) 12/05/2021  . Arthritis   . Atrial fibrillation (HCC)   . Carpal tunnel syndrome 01/16/2022  . Cerebral infarction, unspecified (HCC) 01/16/2022  . Cerebrovascular accident [facility]) 01/16/2022   Dec 27, 2021 Entered By: [facility] D Comment: Left MCA stroke s/p thrombectomy, 12/05/2021.  SABRA Chronic atrial fibrillation (HCC) 09/17/2021  . Coronary artery disease 01/16/2022   Dec 27, 2021 Entered By: [facility] D Comment: S/p stent Obtuse marginal 2, 10/2013.  SABRA Counseling, unspecified 01/16/2022  . Diabetes mellitus without complication (HCC)   . Essential (primary) hypertension 01/16/2022  . Generalized weakness 09/17/2021  . Hypercholesterolemia   . Hyperglycemia due to diabetes mellitus (HCC) 09/17/2021  . Hyperlipidemia 01/16/2022  . Hypertension   . Hyponatremia 09/17/2021  . Kidney stones   . Low back pain 01/16/2022  . MI (myocardial infarction) (HCC)   . Muscle weakness (generalized) 01/16/2022  . Neck pain 01/16/2022  . Non-traumatic rhabdomyolysis 09/19/2021  . Special screening for malignant neoplasms, colon 11/30/2009   May 05, 2010 Entered By: [facility] D Comment: Normal per pt  . Stroke (cerebrum) (HCC) 12/05/2021  . Stroke (HCC) 2010  . Thrombocytopenia (HCC) 09/18/2021  . Traumatic rhabdomyolysis (HCC) 09/17/2021  . Tremor   .  Ulnar neuropathy 01/16/2022    Past Surgical History:  Procedure Laterality Date  . cardiac stents  2005  . CHOLECYSTECTOMY  2007  . IR CT HEAD LTD  12/05/2021  . IR CT HEAD LTD  07/08/2023  . IR PERCUTANEOUS ART THROMBECTOMY/INFUSION INTRACRANIAL INC DIAG ANGIO  12/05/2021  . IR PERCUTANEOUS ART THROMBECTOMY/INFUSION INTRACRANIAL INC DIAG ANGIO  07/08/2023  . IR US  GUIDE VASC ACCESS RIGHT  07/08/2023  . KIDNEY STONE SURGERY  1997-2003  . RADIOLOGY WITH ANESTHESIA N/A 12/05/2021   Procedure: RADIOLOGY WITH ANESTHESIA;  Surgeon: Radiologist, Medication, MD;  Location: [facility] OR;  Service: Radiology;  Laterality: N/A;  . RADIOLOGY WITH ANESTHESIA N/A 07/08/2023   Procedure: IR WITH ANESTHESIA;  Surgeon: Radiologist, Medication, MD;  Location: [facility] OR;  Service: Radiology;  Laterality: N/A;      Physical Exam   Vitals:   05/29/24 2135 05/29/24 2145 05/29/24 2200 05/29/24 2215  BP: (!) 173/89 (!) 176/88 (!) 169/87 (!) 170/90  Pulse: 69 75 70 69  Resp: 16 17 20 17   Temp: 98.1 F (36.7 C)     TempSrc: Oral     SpO2: 100% 100% 100% 100%  Weight:      Height:        Physical Exam Vitals and nursing note reviewed.  Constitutional:      General: He is not in acute distress.    Appearance: He is well-developed.  HENT:     Head: Normocephalic and atraumatic.  Eyes:     General: No visual field deficit.    Conjunctiva/sclera: Conjunctivae normal.  Cardiovascular:     Rate and Rhythm: Normal rate and regular rhythm.     Heart sounds: No murmur heard. Pulmonary:     Effort: Pulmonary effort is normal. No respiratory distress.     Breath sounds: Normal breath sounds.  Abdominal:     Palpations: Abdomen is soft.     Tenderness: There is no abdominal tenderness.  Musculoskeletal:        General: No swelling.     Cervical back: Neck supple.  Skin:    General: Skin is warm and dry.     Capillary Refill: Capillary refill takes less than 2 seconds.  Neurological:     Mental Status: He is alert.      Cranial Nerves: No dysarthria or facial asymmetry.     Sensory: Sensation is intact.     Motor: Weakness present.     Comments: Alert to self and place Mild intermittent aphasia 4/5 strength rt compared to left lower ext  Psychiatric:        Mood and Affect: Mood normal.      Procedures   If procedures were preformed on this patient, they are listed below:  Procedures   @BBSIG @   Please note that this documentation was produced with the assistance of voice-to-text technology and may contain errors.    Sharyne Darina RAMAN, MD 05/29/24 7745    Doretha Folks, MD 06/02/24 1501

## 2024-05-30 ENCOUNTER — Emergency Department (HOSPITAL_COMMUNITY)

## 2024-05-30 LAB — URINALYSIS, ROUTINE W REFLEX MICROSCOPIC
Bilirubin Urine: NEGATIVE
Glucose, UA: NEGATIVE mg/dL
Hgb urine dipstick: NEGATIVE
Ketones, ur: NEGATIVE mg/dL
Leukocytes,Ua: NEGATIVE
Nitrite: NEGATIVE
Protein, ur: NEGATIVE mg/dL
Specific Gravity, Urine: 1.025 (ref 1.005–1.030)
pH: 5 (ref 5.0–8.0)

## 2024-05-30 LAB — RAPID URINE DRUG SCREEN, HOSP PERFORMED
Amphetamines: NOT DETECTED
Barbiturates: NOT DETECTED
Benzodiazepines: NOT DETECTED
Cocaine: NOT DETECTED
Opiates: NOT DETECTED
Tetrahydrocannabinol: NOT DETECTED

## 2024-05-30 NOTE — ED Provider Notes (Signed)
 Patient handed off by previous team:  73 year old male history of A-fib, CVA status post thrombectomy, CAD status post PCI/DES, type 2 diabetes, hypertension, hyperlipidemia who presented to the emergency department for progressive weakness as well as acute onset aphasia starting at 1137/31.  CT head and CT a head performed without evidence of acute intracranial abnormalities.  MRI ordered and pending  Plan: -Follow-up MRI -If MRI is negative patient is safe to be discharged with outpatient neuro follow-up -If MRI is positive, reach out to neurology    Physical Exam  BP (!) 164/99   Pulse 84   Temp (!) 97.5 F (36.4 C) (Oral)   Resp 19   Ht 6' (1.829 m)   Wt 93 kg   SpO2 100%   BMI 27.80 kg/m    ED Course / MDM   Clinical Course as of 05/30/24 0709  Thu May 29, 2024  1813 CBC unremarkable.  Particular no leukocytosis or anemia [RC]  1813 Calcium  Ionized(!): 1.13 [RC]  1838 CT ANGIO HEAD NECK W WO CM (CODE STROKE) No acute critical stenosis [RC]  1838 CT HEAD CODE STROKE WO CONTRAST No acute LVO [RC]  1839 Creatinine(!): 1.35 Mild AKI.  IVF given [RC]  2244 EKG 12-Lead Normal rate ventricularly paced rhythm, no Sgarbossa [RC]  2244 Alcohol , Ethyl (B): <15 [RC]  Fri May 30, 2024  0659 R weakness, code stroke. MRI neg then dc versus f/u w neuro [  ] [AG]    Clinical Course User Index [AG] Nada Chroman, DO [RC] Sharyne Darina RAMAN, MD   Medical Decision Making Amount and/or Complexity of Data Reviewed Labs: ordered. Decision-making details documented in ED Course. Radiology: ordered. Decision-making details documented in ED Course. ECG/medicine tests:  Decision-making details documented in ED Course.  Risk Prescription drug management.   During my evaluation patient remained hemodynamically stable, afebrile and not in acute distress.  Patient was evaluated by cardiology PA and cleared for MRI.  Patient's pacemaker will be turned to MRI mode.  Patient was handed  off to oncoming team, Dr. Sharyne with plans to obtain MRI imaging.  Plan still remains to discharge with outpatient follow-up if MRI is negative and reengage neurology if MRI is positive.   Chroman Nada DO  Emergency Medicine PGY2       Nada Chroman, DO 05/30/24 1536    Horton, Roxie HERO, DO 05/31/24 [phone]

## 2024-05-30 NOTE — Progress Notes (Signed)
  Device system confirmed to be MRI conditional, with implant date > 6 weeks ago, and no evidence of abandoned or epicardial leads in review of most recent CXR  Device last cleared by EP Provider: Suzann Riddle 05/30/24  Clearance is good through for 1 year as long as parameters remain stable at time of check. If pt undergoes a cardiac device procedure during that time, they should be re-cleared.   Tachy-therapies to be programmed off if applicable with device back to pre-MRI settings after completion of exam.  [employer] - Programming recommendation received through [employer] App/Tablet  Izetta CHRISTELLA Linen, RT  05/30/2024 4:47 PM

## 2024-05-30 NOTE — ED Provider Notes (Signed)
  Assumed care at shift change.  See prior notes for full H&P.  Briefly, 73 y.o. M here after a fall on thinners.  Apparently has been having some aphasia and some minor weakness on RLE.  Did have prior stroke September 2024.    Work-up thus far has been reassuring.    Plan:  MRI in the AM (has pacemaker so needs rep to change modes).  Anticipate discharge if no acute findings on MRI.  6:07 AM No acute changes overnight.  Still awaiting MRI.  Care will be signed out to oncoming AM team to follow-up on this.   Jarold Olam HERO, PA-C 05/30/24 [id number]    Haze Lonni PARAS, MD 05/30/24 [phone]

## 2024-05-30 NOTE — ED Notes (Signed)
 The patient's son, Selinda Kleine is requesting a status update. [phone]

## 2024-05-30 NOTE — Evaluation (Signed)
 Occupational Therapy Evaluation Patient Details Name: Cory Brown MRN: [id number] DOB: 22-Dec-1950 Today's Date: 05/30/2024   History of Present Illness   Cory Brown is a 73 y.o. male who presented to [facility] ED 05/29/20 after a fall yesterday with family noting worsening aphasia. CT head with chronic encephalomalacia changes bilaterally and no apparent acute process. Pending MRI. PMHx: A-fib, DM, HTN, MI, and CVA in 2024.     Clinical Impressions Cory Brown was evaluated s/p the above admission list. He lives alone and reports bieng mod I without AD at baseline, however he also shares that he falls frequently and states, that is just apart of life. Upon evaluation the pt was limited by R hemi weakness, poor RUE coordination, limited insight to safety and deficits, poor balance, unsteady gait and decreased activity tolerance. Overall he needed min for functional mobility without AD. Pt's safety and balance would significantly increased with use of AD, education provided, pt agreeable to try RW use. Due to the deficits listed below the pt also needs up to min A for LB ADLs and set up A for UB ADLs in sitting. Pt will benefit from continued acute OT services and intensive inpatient follow up therapy, >3 hours/day after discharge.  Of note, pt's MRI and stroke workup are still pending at the time of evaluation. Anticipate pt could get to a mod I level with intense therapy in the acute setting and discharge without need for assist at home. If pt discharges home from the acute hospital, his fall risk is significantly high and he would need increased assist from family and HHOT.       If plan is discharge home, recommend the following:   A little help with walking and/or transfers;A little help with bathing/dressing/bathroom;Assistance with cooking/housework;Direct supervision/assist for medications management;Direct supervision/assist for financial management;Assist for transportation;Help with stairs or  ramp for entrance     Functional Status Assessment   Patient has had a recent decline in their functional status and demonstrates the ability to make significant improvements in function in a reasonable and predictable amount of time.     Equipment Recommendations   None recommended by OT     Recommendations for Other Services   Rehab consult     Precautions/Restrictions   Precautions Precautions: Fall Recall of Precautions/Restrictions: Intact Restrictions Weight Bearing Restrictions Per Provider Order: No     Mobility Bed Mobility Overal bed mobility: Needs Assistance Bed Mobility: Supine to Sit, Sit to Supine     Supine to sit: Contact guard Sit to supine: Contact guard assist   General bed mobility comments: increased time    Transfers Overall transfer level: Needs assistance Equipment used: 1 person hand held assist Transfers: Sit to/from Stand Sit to Stand: Min assist           General transfer comment: unsteady. attempted without AD since pt does not use AD at home. Pt would have increased safety with RW      Balance Overall balance assessment: Needs assistance Sitting-balance support: No upper extremity supported, Feet supported, Bilateral upper extremity supported Sitting balance-Leahy Scale: Fair     Standing balance support: Bilateral upper extremity supported, During functional activity, Reliant on assistive device for balance Standing balance-Leahy Scale: Poor             ADL either performed or assessed with clinical judgement   ADL Overall ADL's : Needs assistance/impaired Eating/Feeding: Set up;Sitting   Grooming: Set up;Sitting   Upper Body Bathing: Set up;Sitting  Lower Body Bathing: Contact guard assist;Sit to/from stand   Upper Body Dressing : Set up;Sitting   Lower Body Dressing: Minimal assistance;Sit to/from Database administrator: Ambulation;Minimal assistance   Toileting- Architect and  Hygiene: Sit to/from stand;Minimal assistance       Functional mobility during ADLs: Contact guard assist General ADL Comments: pt with high fall risk during OOB ADLs due to R weakness and unsteady balance     Vision Baseline Vision/History: 0 No visual deficits Vision Assessment?: No apparent visual deficits     Perception Perception: Not tested       Praxis Praxis: Not tested       Pertinent Vitals/Pain Pain Assessment Pain Assessment: Faces Faces Pain Scale: Hurts a little bit Pain Location: back & hip Pain Descriptors / Indicators: Sore Pain Intervention(s): Limited activity within patient's tolerance, Monitored during session     Extremity/Trunk Assessment Upper Extremity Assessment Upper Extremity Assessment: RUE deficits/detail;LUE deficits/detail;Right hand dominant RUE Deficits / Details: 3/5 MMT globally. Shoulder flexion limited for 90*, elbow, wrist and hand ROM are WFL. Coordination is slowed and impaired. distal parasthesias noted RUE Sensation: decreased light touch;decreased proprioception RUE Coordination: decreased fine motor;decreased gross motor LUE Deficits / Details: 4/5 globally, WFL for funcitonal tasks LUE Coordination: WNL   Lower Extremity Assessment Lower Extremity Assessment: Defer to PT evaluation   Cervical / Trunk Assessment Cervical / Trunk Assessment: Kyphotic (mild)   Communication Communication Communication: No apparent difficulties   Cognition Arousal: Alert Behavior During Therapy: WFL for tasks assessed/performed Cognition: No family/caregiver present to determine baseline             OT - Cognition Comments: oriented and follows most commands. pt has very limited insight into safety awareness and had difficulty answering some PLOF questions. Pt reports several recent falls but states thats just apart of life.                 Following commands: Impaired Following commands impaired: Only follows one step  commands consistently, Follows multi-step commands inconsistently     Cueing  General Comments   Cueing Techniques: Verbal cues  VSS stable throughout           Home Living Family/patient expects to be discharged to:: Private residence Living Arrangements: Alone Available Help at Discharge: Friend(s);Available PRN/intermittently;Family Type of Home: House Home Access: Stairs to enter [employer] of Steps: 2 Entrance Stairs-Rails: Right;Left;Can reach both Home Layout: One level     Bathroom Shower/Tub: Producer, television/film/video: Standard     Home Equipment: Agricultural consultant (2 wheels);Cane - single point;Shower seat - built in;Grab bars - tub/shower;Hand held shower head;Wheelchair - manual   Additional Comments: pt with frequent falls and states thats just apart of life.      Prior Functioning/Environment Prior Level of Function : Independent/Modified Independent;Driving;History of Falls (last six months)             Mobility Comments: ambulates without AD, pt reports many falls. The most recent fall he was on teh ground for 3-4 hours. Pt shares that most of his falls are due to RLE weakness and tripping ADLs Comments: states indep for ADLs, has falls during ADLs. Manages IADLs indep, family comes over ~everyother day to check on patient    OT Problem List: Decreased strength;Decreased range of motion;Decreased activity tolerance;Impaired balance (sitting and/or standing);Decreased safety awareness;Decreased knowledge of use of DME or AE;Decreased cognition;Decreased coordination;Impaired UE functional use  OT Goals(Current goals can be found in the care plan section)   Acute Rehab OT Goals Patient Stated Goal: to get better OT Goal Formulation: With patient Time For Goal Achievement: 06/13/24 Potential to Achieve Goals: Good ADL Goals Pt Will Perform Grooming: with modified independence;standing Pt Will Perform Upper Body  Dressing: with modified independence Pt Will Perform Lower Body Dressing: with modified independence;sit to/from stand Pt Will Perform Toileting - Clothing Manipulation and hygiene: with modified independence Additional ADL Goal #1: Pt will complete household distance functional mobility with LRAD at mod I level to demonstrate reduce risk for falls   AM-PAC OT 6 Clicks Daily Activity     Outcome Measure Help from another person eating meals?: A Little Help from another person taking care of personal grooming?: A Little Help from another person toileting, which includes using toliet, bedpan, or urinal?: A Little Help from another person bathing (including washing, rinsing, drying)?: A Little Help from another person to put on and taking off regular upper body clothing?: A Little Help from another person to put on and taking off regular lower body clothing?: A Little 6 Click Score: 18   End of Session Nurse Communication: Mobility status  Activity Tolerance: Patient tolerated treatment well Patient left: in bed;with call bell/phone within reach  OT Visit Diagnosis: Unsteadiness on feet (R26.81);Other abnormalities of gait and mobility (R26.89);Repeated falls (R29.6);Muscle weakness (generalized) (M62.81);History of falling (Z91.81);Hemiplegia and hemiparesis Hemiplegia - Right/Left: Right Hemiplegia - dominant/non-dominant: Dominant Hemiplegia - caused by: Cerebral infarction                Time: 0956-1010 OT Time Calculation (min): 14 min Charges:  OT General Charges $OT Visit: 1 Visit OT Evaluation $OT Eval Moderate Complexity: 1 Mod  Lucie Kendall, OTR/L Acute Rehabilitation Services Office [phone] Secure Chat Communication Preferred   Lucie JONETTA Kendall 05/30/2024, 11:38 AM

## 2024-05-30 NOTE — ED Provider Notes (Signed)
 Patient care assumed from previous provider.   Patient care of Cory Brown is a 73 y.o. male from previous provider. Please see the original provider note from this emergency department encounter for full history and physical.   Course of Care and my assessment at the time of sign out is detailed in the ED Course below.   Clinical Course as of 05/30/24 1812  Thu May 29, 2024  1813 CBC unremarkable.  Particular no leukocytosis or anemia [RC]  1813 Calcium  Ionized(!): 1.13 [RC]  1838 CT ANGIO HEAD NECK W WO CM (CODE STROKE) No acute critical stenosis [RC]  1838 CT HEAD CODE STROKE WO CONTRAST No acute LVO [RC]  1839 Creatinine(!): 1.35 Mild AKI.  IVF given [RC]  2244 EKG 12-Lead Normal rate ventricularly paced rhythm, no Sgarbossa [RC]  2244 Alcohol , Ethyl (B): <15 [RC]  Fri May 30, 2024     1616 Mri soon [RC]  1812 MR BRAIN WO CONTRAST No acute intracranial abnormality [RC]    Clinical Course User Index  [RC] Sharyne Darina RAMAN, MD     Labs reviewed by myself and considered in medical decision making.  Imaging reviewed by myself and considered in medical decision making. Imaging final read interpreted by radiology.  1. Aphasia   2. Fall, initial encounter     On my reassessment patient remains hemodynamically stable with Cory unchanged neurologic exam.  Reviewing his MRI he has no acute intracranial findings and per neurology notes he is appropriate for discharge.  He was seen by physical therapy and Occupational Therapy while here.  I do believe that he will benefit from further physical therapy and Occupational Therapy based on the recommendations thus referrals were placed.  He is actively trying to get the same through the [facility].  He did discuss this with the patient and the family at bedside and patient was discharged in stable condition.  Discharge    The plan for this patient was discussed with Dr. Ula, Prentice SAUNDERS, MD , who voiced agreement and who oversaw evaluation and  treatment of this patient.    Sharyne Darina RAMAN, MD 05/30/24 1821    Ula Prentice SAUNDERS, MD 05/30/24 [phone]

## 2024-05-30 NOTE — ED Notes (Signed)
 Pt son Selinda updated on MRI status.

## 2024-05-30 NOTE — Discharge Instructions (Signed)
 You were seen today for difficulty speaking and trouble with cognition. While you were here we monitored your vitals, preformed a physical exam, and CT scans, labs and MRIs. These were all reassuring and there is no indication for any further testing or intervention in the emergency department at this time.   Things to do:  - Follow up with your primary care provider within the next 1-2 weeks - I have placed referrals for you to see physical therapy and Occupational Therapy.  Please follow-up at the [facility] as I know you already have these referrals there  Return to the emergency department if you have any new or worsening symptoms including facial droop, worsening weakness, difficulty breathing, dizziness or headaches that are unchanged with Tylenol , or if you have any other concerns.

## 2024-05-30 NOTE — Progress Notes (Signed)
 Physical Therapy Evaluation Patient Details Name: Cory Brown MRN: [id number] DOB: [birth year] Today's Date: 05/30/2024  History of Present Illness  [name] is a 73 y.o. male who presented to [facility] ED 05/29/20 after a fall yesterday with family noting worsening aphasia. CT head with chronic encephalomalacia changes bilaterally and no apparent acute process. Pending MRI. PMHx: A-fib, DM, HTN, MI, and CVA in 2024.   Clinical Impression  Pt admitted with above diagnosis. PTA, pt was independent with functional mobility, ADLs/IADLs, and driving. He lives alone in a one story house with 2 STE and railings. Pt currently with functional limitations due to the deficits listed below (see PT Problem List). He required CGA-minA for bed mobility, CGA for transfers using RW, and CGA-minA for gait using RW. Pt demonstrated a posterior lean during seated balance without UE support. He ambulated slowly with small steps and minimal foot clearance. Pt experienced 2 LOB to the left during gait requiring minA to stabilize. Pt will benefit from acute skilled PT to increase his independence and safety with mobility to allow discharge. Recommend HHPT to increase strength, improve balance, decrease fall risk, and optimize safety within the home environment.    If plan is discharge home, recommend the following: A little help with walking and/or transfers;A little help with bathing/dressing/bathroom;Help with stairs or ramp for entrance;Assistance with cooking/housework;Assist for transportation   Can travel by private vehicle        Equipment Recommendations None recommended by PT (Pt already has DME)  Recommendations for Other Services       Functional Status Assessment Patient has had a recent decline in their functional status and demonstrates the ability to make significant improvements in function in a reasonable and predictable amount of time.     Precautions / Restrictions Precautions Precautions:  Fall Recall of Precautions/Restrictions: Intact Restrictions Weight Bearing Restrictions Per Provider Order: No      Mobility  Bed Mobility Overal bed mobility: Needs Assistance Bed Mobility: Supine to Sit, Sit to Supine     Supine to sit: Contact guard, HOB elevated, Used rails Sit to supine: HOB elevated, Used rails, Min assist   General bed mobility comments: Pt sat up on R side of bed with increased time. Assist to elevate trunk. He scooted fwd til feet flat on ground. Returning to bed assist to pivot so trunk and legs entered and pt avoided bed rail.    Transfers Overall transfer level: Needs assistance Equipment used: Rolling walker (2 wheels) Transfers: Sit to/from Stand Sit to Stand: Contact guard assist           General transfer comment: Pt stood from stretcher. Cued proper hand placement using RW. No physical assist to power up. CGA for safety/stability. Good eccentric control with sitting.    Ambulation/Gait Ambulation/Gait assistance: Contact guard assist, Min assist Gait Distance (Feet): 40 Feet Assistive device: Rolling walker (2 wheels) Gait Pattern/deviations: Step-to pattern, Decreased step length - right, Decreased step length - left, Decreased stride length, Staggering left Gait velocity: decreased Gait velocity interpretation: <1.31 ft/sec, indicative of household ambulator   General Gait Details: Pt ambulated with short slow steps with minimal foot clearence. Cued pt to increase step length, increase foot clearence, improve proximity to RW, and maintain all four points of contact of the RW on the ground. Pt unsteady with 2 LOB to the left requiring minA to correct/stabilize.  Stairs            Armed forces technical officer  Bed    Modified Rankin (Stroke Patients Only)       Balance Overall balance assessment: Needs assistance Sitting-balance support: No upper extremity supported, Feet supported, Bilateral upper extremity  supported Sitting balance-Leahy Scale: Fair Sitting balance - Comments: Pt sat EOB and demonstrated posterior lean during LE MMT. He maintained upright posture with BUE support on bed. Postural control: Posterior lean Standing balance support: Bilateral upper extremity supported, During functional activity, Reliant on assistive device for balance Standing balance-Leahy Scale: Poor Standing balance comment: Pt dependent on RW. 2 LOB to the left requiring minA to correct.                             Pertinent Vitals/Pain Pain Assessment Pain Assessment: No/denies pain    Home Living Family/patient expects to be discharged to:: Private residence Living Arrangements: Alone Available Help at Discharge: Friend(s);Available PRN/intermittently Type of Home: House Home Access: Stairs to enter Entrance Stairs-Rails: Right;Left;Can reach both Entrance Stairs-Number of Steps: 2   Home Layout: One level Home Equipment: Agricultural consultant (2 wheels);Cane - single point;Shower seat - built in;Grab bars - tub/shower;Hand held shower head;Wheelchair - manual      Prior Function Prior Level of Function : Independent/Modified Independent;Driving             Mobility Comments: Ambulates without AD. 1 fall 3 weeks ago and another fall leading to current admission. ADLs Comments: Indep with ADLs. Drives. Manages his own medications. Cooks. Completes the household chores. Likes to be out in the yard for fun.     Extremity/Trunk Assessment   Upper Extremity Assessment Upper Extremity Assessment: Defer to OT evaluation    Lower Extremity Assessment Lower Extremity Assessment: RLE deficits/detail;LLE deficits/detail RLE Deficits / Details: AROM WFL. Hip flex 4-/5, Knee ext 3-/5, Ankle DF 4-/5. RLE Sensation: WNL RLE Coordination: decreased gross motor LLE Deficits / Details: AROM WFL. Hip flex 4-/5, Knee ext 3-/5, Ankle DF 4-/5. LLE Sensation: WNL LLE Coordination: decreased gross  motor       Communication   Communication Communication: No apparent difficulties    Cognition Arousal: Alert Behavior During Therapy: WFL for tasks assessed/performed   PT - Cognitive impairments: No family/caregiver present to determine baseline, Orientation   Orientation impairments: Place, Time (Pt was aware he was in the hospital, but didn't know the name or city. He reported the date at 7/25.)                   PT - Cognition Comments: Pt A,Ox2. Following commands: Intact       Cueing Cueing Techniques: Verbal cues     General Comments General comments (skin integrity, edema, etc.): Start of Session: BP 163/86 (110), HR 84. End of Session: BP 156/94 (112), HR 82.    Exercises     Assessment/Plan    PT Assessment Patient needs continued PT services  PT Problem List Decreased strength;Decreased activity tolerance;Decreased balance;Decreased mobility;Decreased knowledge of use of DME       PT Treatment Interventions DME instruction;Gait training;Stair training;Functional mobility training;Therapeutic activities;Therapeutic exercise;Balance training;Patient/family education    PT Goals (Current goals can be found in the Care Plan section)  Acute Rehab PT Goals Patient Stated Goal: Return Home and improve my balance so I do not fall again. PT Goal Formulation: With patient Time For Goal Achievement: 06/13/24 Potential to Achieve Goals: Good    Frequency Min 2X/week     Co-evaluation  AM-PAC PT 6 Clicks Mobility  Outcome Measure Help needed turning from your back to your side while in a flat bed without using bedrails?: A Little Help needed moving from lying on your back to sitting on the side of a flat bed without using bedrails?: A Little Help needed moving to and from a bed to a chair (including a wheelchair)?: A Little Help needed standing up from a chair using your arms (e.g., wheelchair or bedside chair)?: A Little Help  needed to walk in hospital room?: A Little Help needed climbing 3-5 steps with a railing? : A Lot 6 Click Score: 17    End of Session Equipment Utilized During Treatment: Gait belt Activity Tolerance: Patient tolerated treatment well Patient left: in bed;with call bell/phone within reach Nurse Communication: Mobility status PT Visit Diagnosis: Difficulty in walking, not elsewhere classified (R26.2);Unsteadiness on feet (R26.81);Other abnormalities of gait and mobility (R26.89)    Time: 9255-9191 PT Time Calculation (min) (ACUTE ONLY): 24 min   Charges:   PT Evaluation $PT Eval Moderate Complexity: 1 Mod PT Treatments $Gait Training: 8-22 mins PT General Charges $$ ACUTE PT VISIT: 1 Visit         Randall SAUNDERS, PT, DPT Acute Rehabilitation Services Office: [phone] Secure Chat Preferred  Delon CHRISTELLA Callander 05/30/2024, 8:30 AM
